# Patient Record
Sex: Female | Born: 1945 | Race: White | Hispanic: No | Marital: Married | Smoking: Former smoker
Health system: Southern US, Community
[De-identification: ages and names within clinical notes are randomized; demographics above are authoritative.]

## PROBLEM LIST (undated history)

## (undated) DIAGNOSIS — H269 Unspecified cataract: Secondary | ICD-10-CM

## (undated) DIAGNOSIS — D126 Benign neoplasm of colon, unspecified: Secondary | ICD-10-CM

## (undated) DIAGNOSIS — I4891 Unspecified atrial fibrillation: Secondary | ICD-10-CM

## (undated) DIAGNOSIS — I471 Supraventricular tachycardia, unspecified: Secondary | ICD-10-CM

## (undated) DIAGNOSIS — T7840XA Allergy, unspecified, initial encounter: Secondary | ICD-10-CM

## (undated) DIAGNOSIS — M1990 Unspecified osteoarthritis, unspecified site: Secondary | ICD-10-CM

## (undated) DIAGNOSIS — E785 Hyperlipidemia, unspecified: Secondary | ICD-10-CM

## (undated) DIAGNOSIS — G4730 Sleep apnea, unspecified: Secondary | ICD-10-CM

## (undated) DIAGNOSIS — K224 Dyskinesia of esophagus: Secondary | ICD-10-CM

## (undated) DIAGNOSIS — E079 Disorder of thyroid, unspecified: Secondary | ICD-10-CM

## (undated) DIAGNOSIS — K219 Gastro-esophageal reflux disease without esophagitis: Secondary | ICD-10-CM

## (undated) DIAGNOSIS — M8580 Other specified disorders of bone density and structure, unspecified site: Secondary | ICD-10-CM

## (undated) DIAGNOSIS — L439 Lichen planus, unspecified: Secondary | ICD-10-CM

## (undated) DIAGNOSIS — I10 Essential (primary) hypertension: Secondary | ICD-10-CM

## (undated) DIAGNOSIS — J45909 Unspecified asthma, uncomplicated: Secondary | ICD-10-CM

## (undated) DIAGNOSIS — Z86010 Personal history of colonic polyps: Secondary | ICD-10-CM

## (undated) DIAGNOSIS — B019 Varicella without complication: Secondary | ICD-10-CM

## (undated) HISTORY — DX: Personal history of colonic polyps: Z86.010

## (undated) HISTORY — DX: Unspecified atrial fibrillation: I48.91

## (undated) HISTORY — DX: Unspecified asthma, uncomplicated: J45.909

## (undated) HISTORY — DX: Hyperlipidemia, unspecified: E78.5

## (undated) HISTORY — DX: Lichen planus, unspecified: L43.9

## (undated) HISTORY — PX: POLYPECTOMY: SHX149

## (undated) HISTORY — DX: Supraventricular tachycardia, unspecified: I47.10

## (undated) HISTORY — DX: Disorder of thyroid, unspecified: E07.9

## (undated) HISTORY — DX: Gastro-esophageal reflux disease without esophagitis: K21.9

## (undated) HISTORY — DX: Unspecified cataract: H26.9

## (undated) HISTORY — PX: COLONOSCOPY: SHX174

## (undated) HISTORY — DX: Dyskinesia of esophagus: K22.4

## (undated) HISTORY — DX: Supraventricular tachycardia: I47.1

## (undated) HISTORY — PX: BREAST EXCISIONAL BIOPSY: SUR124

## (undated) HISTORY — DX: Essential (primary) hypertension: I10

## (undated) HISTORY — PX: CATARACT EXTRACTION: SUR2

## (undated) HISTORY — DX: Benign neoplasm of colon, unspecified: D12.6

## (undated) HISTORY — DX: Allergy, unspecified, initial encounter: T78.40XA

## (undated) HISTORY — PX: TONSILLECTOMY: SUR1361

## (undated) HISTORY — DX: Varicella without complication: B01.9

## (undated) HISTORY — PX: APPENDECTOMY: SHX54

## (undated) HISTORY — PX: BREAST CYST EXCISION: SHX579

## (undated) HISTORY — DX: Sleep apnea, unspecified: G47.30

## (undated) HISTORY — DX: Other specified disorders of bone density and structure, unspecified site: M85.80

## (undated) HISTORY — PX: THYROIDECTOMY, PARTIAL: SHX18

---

## 1967-10-21 HISTORY — PX: OOPHORECTOMY: SHX86

## 1970-10-20 HISTORY — PX: LAPAROSCOPIC ENDOMETRIOSIS FULGURATION: SUR769

## 1998-02-15 ENCOUNTER — Other Ambulatory Visit: Admission: RE | Admit: 1998-02-15 | Discharge: 1998-02-15 | Payer: Self-pay | Admitting: Obstetrics and Gynecology

## 1999-02-27 ENCOUNTER — Other Ambulatory Visit: Admission: RE | Admit: 1999-02-27 | Discharge: 1999-02-27 | Payer: Self-pay | Admitting: Obstetrics and Gynecology

## 2000-02-05 ENCOUNTER — Ambulatory Visit (HOSPITAL_COMMUNITY): Admission: RE | Admit: 2000-02-05 | Discharge: 2000-02-05 | Payer: Self-pay | Admitting: Gastroenterology

## 2000-02-17 ENCOUNTER — Encounter: Admission: RE | Admit: 2000-02-17 | Discharge: 2000-02-17 | Payer: Self-pay | Admitting: Obstetrics and Gynecology

## 2000-02-17 ENCOUNTER — Encounter: Payer: Self-pay | Admitting: Obstetrics and Gynecology

## 2000-02-28 ENCOUNTER — Other Ambulatory Visit: Admission: RE | Admit: 2000-02-28 | Discharge: 2000-02-28 | Payer: Self-pay | Admitting: Obstetrics and Gynecology

## 2000-03-25 ENCOUNTER — Other Ambulatory Visit: Admission: RE | Admit: 2000-03-25 | Discharge: 2000-03-25 | Payer: Self-pay | Admitting: Obstetrics and Gynecology

## 2001-03-25 ENCOUNTER — Encounter: Admission: RE | Admit: 2001-03-25 | Discharge: 2001-03-25 | Payer: Self-pay | Admitting: Obstetrics and Gynecology

## 2001-03-25 ENCOUNTER — Encounter: Payer: Self-pay | Admitting: Obstetrics and Gynecology

## 2001-04-28 ENCOUNTER — Other Ambulatory Visit: Admission: RE | Admit: 2001-04-28 | Discharge: 2001-04-28 | Payer: Self-pay | Admitting: Obstetrics and Gynecology

## 2002-04-27 ENCOUNTER — Encounter: Admission: RE | Admit: 2002-04-27 | Discharge: 2002-04-27 | Payer: Self-pay | Admitting: General Surgery

## 2002-04-27 ENCOUNTER — Encounter: Payer: Self-pay | Admitting: General Surgery

## 2002-04-28 ENCOUNTER — Other Ambulatory Visit: Admission: RE | Admit: 2002-04-28 | Discharge: 2002-04-28 | Payer: Self-pay | Admitting: Obstetrics and Gynecology

## 2002-08-19 ENCOUNTER — Encounter: Admission: RE | Admit: 2002-08-19 | Discharge: 2002-08-19 | Payer: Self-pay | Admitting: Obstetrics and Gynecology

## 2002-08-19 ENCOUNTER — Encounter: Payer: Self-pay | Admitting: Obstetrics and Gynecology

## 2002-09-01 ENCOUNTER — Encounter (INDEPENDENT_AMBULATORY_CARE_PROVIDER_SITE_OTHER): Payer: Self-pay | Admitting: Specialist

## 2002-09-01 ENCOUNTER — Ambulatory Visit (HOSPITAL_BASED_OUTPATIENT_CLINIC_OR_DEPARTMENT_OTHER): Admission: RE | Admit: 2002-09-01 | Discharge: 2002-09-01 | Payer: Self-pay | Admitting: General Surgery

## 2003-05-01 ENCOUNTER — Other Ambulatory Visit: Admission: RE | Admit: 2003-05-01 | Discharge: 2003-05-01 | Payer: Self-pay | Admitting: Obstetrics and Gynecology

## 2003-05-05 ENCOUNTER — Encounter: Admission: RE | Admit: 2003-05-05 | Discharge: 2003-05-05 | Payer: Self-pay | Admitting: General Surgery

## 2003-05-05 ENCOUNTER — Encounter: Payer: Self-pay | Admitting: General Surgery

## 2003-06-12 ENCOUNTER — Ambulatory Visit (HOSPITAL_COMMUNITY): Admission: RE | Admit: 2003-06-12 | Discharge: 2003-06-12 | Payer: Self-pay | Admitting: Gastroenterology

## 2003-06-12 ENCOUNTER — Encounter (INDEPENDENT_AMBULATORY_CARE_PROVIDER_SITE_OTHER): Payer: Self-pay | Admitting: Specialist

## 2003-06-12 DIAGNOSIS — Z860100 Personal history of colon polyps, unspecified: Secondary | ICD-10-CM

## 2003-06-12 HISTORY — DX: Personal history of colonic polyps: Z86.010

## 2003-06-12 HISTORY — DX: Personal history of colon polyps, unspecified: Z86.0100

## 2004-05-01 ENCOUNTER — Other Ambulatory Visit: Admission: RE | Admit: 2004-05-01 | Discharge: 2004-05-01 | Payer: Self-pay | Admitting: Obstetrics and Gynecology

## 2004-05-30 ENCOUNTER — Encounter: Admission: RE | Admit: 2004-05-30 | Discharge: 2004-05-30 | Payer: Self-pay | Admitting: Obstetrics and Gynecology

## 2004-09-11 ENCOUNTER — Encounter: Admission: RE | Admit: 2004-09-11 | Discharge: 2004-09-11 | Payer: Self-pay | Admitting: Obstetrics and Gynecology

## 2005-05-05 ENCOUNTER — Other Ambulatory Visit: Admission: RE | Admit: 2005-05-05 | Discharge: 2005-05-05 | Payer: Self-pay | Admitting: Obstetrics and Gynecology

## 2005-06-30 ENCOUNTER — Encounter: Admission: RE | Admit: 2005-06-30 | Discharge: 2005-06-30 | Payer: Self-pay | Admitting: Obstetrics and Gynecology

## 2006-05-06 ENCOUNTER — Other Ambulatory Visit: Admission: RE | Admit: 2006-05-06 | Discharge: 2006-05-06 | Payer: Self-pay | Admitting: Obstetrics and Gynecology

## 2006-07-09 ENCOUNTER — Encounter: Admission: RE | Admit: 2006-07-09 | Discharge: 2006-07-09 | Payer: Self-pay | Admitting: Obstetrics and Gynecology

## 2006-10-02 ENCOUNTER — Encounter: Admission: RE | Admit: 2006-10-02 | Discharge: 2006-10-02 | Payer: Self-pay | Admitting: Obstetrics and Gynecology

## 2007-05-10 ENCOUNTER — Other Ambulatory Visit: Admission: RE | Admit: 2007-05-10 | Discharge: 2007-05-10 | Payer: Self-pay | Admitting: Obstetrics and Gynecology

## 2007-07-14 ENCOUNTER — Encounter: Admission: RE | Admit: 2007-07-14 | Discharge: 2007-07-14 | Payer: Self-pay | Admitting: Obstetrics and Gynecology

## 2007-10-28 ENCOUNTER — Other Ambulatory Visit: Admission: RE | Admit: 2007-10-28 | Discharge: 2007-10-28 | Payer: Self-pay | Admitting: Obstetrics and Gynecology

## 2008-05-10 ENCOUNTER — Other Ambulatory Visit: Admission: RE | Admit: 2008-05-10 | Discharge: 2008-05-10 | Payer: Self-pay | Admitting: Obstetrics and Gynecology

## 2008-07-14 ENCOUNTER — Encounter: Admission: RE | Admit: 2008-07-14 | Discharge: 2008-07-14 | Payer: Self-pay | Admitting: Obstetrics and Gynecology

## 2008-10-20 HISTORY — PX: EYE SURGERY: SHX253

## 2008-12-27 ENCOUNTER — Encounter: Admission: RE | Admit: 2008-12-27 | Discharge: 2008-12-27 | Payer: Self-pay | Admitting: Obstetrics and Gynecology

## 2009-05-16 ENCOUNTER — Other Ambulatory Visit: Admission: RE | Admit: 2009-05-16 | Discharge: 2009-05-16 | Payer: Self-pay | Admitting: Obstetrics and Gynecology

## 2009-05-16 ENCOUNTER — Encounter: Payer: Self-pay | Admitting: Obstetrics and Gynecology

## 2009-05-16 ENCOUNTER — Ambulatory Visit: Payer: Self-pay | Admitting: Obstetrics and Gynecology

## 2009-07-18 ENCOUNTER — Encounter: Admission: RE | Admit: 2009-07-18 | Discharge: 2009-07-18 | Payer: Self-pay | Admitting: Obstetrics and Gynecology

## 2009-09-03 ENCOUNTER — Ambulatory Visit: Payer: Self-pay | Admitting: Obstetrics and Gynecology

## 2009-09-10 ENCOUNTER — Encounter: Admission: RE | Admit: 2009-09-10 | Discharge: 2009-09-10 | Payer: Self-pay | Admitting: Obstetrics and Gynecology

## 2009-11-30 ENCOUNTER — Ambulatory Visit: Payer: Self-pay | Admitting: Internal Medicine

## 2009-11-30 DIAGNOSIS — J45909 Unspecified asthma, uncomplicated: Secondary | ICD-10-CM | POA: Insufficient documentation

## 2009-12-09 DIAGNOSIS — E785 Hyperlipidemia, unspecified: Secondary | ICD-10-CM

## 2009-12-09 DIAGNOSIS — I10 Essential (primary) hypertension: Secondary | ICD-10-CM | POA: Insufficient documentation

## 2010-05-29 ENCOUNTER — Other Ambulatory Visit: Admission: RE | Admit: 2010-05-29 | Discharge: 2010-05-29 | Payer: Self-pay | Admitting: Obstetrics and Gynecology

## 2010-05-29 ENCOUNTER — Ambulatory Visit: Payer: Self-pay | Admitting: Obstetrics and Gynecology

## 2010-07-24 ENCOUNTER — Ambulatory Visit: Payer: Self-pay | Admitting: Cardiology

## 2010-08-22 ENCOUNTER — Encounter: Admission: RE | Admit: 2010-08-22 | Discharge: 2010-08-22 | Payer: Self-pay | Admitting: Obstetrics and Gynecology

## 2010-11-19 NOTE — Assessment & Plan Note (Signed)
Summary: WHEEZING/ MBW   Primary Provider/Referring Provider:  Gordy Clement  CC:  Pulmonary New Pt-asthma.  History of Present Illness: November 30, 2009- 65 yoF followed in the past at the old office and coming now to re-establish for asthma. Former smoker dx'd with asthma 20 years ago. She had not noted significant symptoms over the past 8 years. Previously as significant trigger was tobacco smoke exposure, but that had ended. Shortness of breath returned 2 months ago, with cold weather and exposure to a christmas tree. She did well while in [place] skiing this winter. Dr Deborah Chalk gave a Proair inhaler which she has rarely used, noting only occasional wheeze. Post nasal drip and dry cough come and go. No obvious respiratory infection. Comfortable lying down. Mild throat tickle. Reflux is controlled. No hx of pneumonia ot TB exposure. Treated for hypertension without heart disease. Has had flu vax, never pneumovax.  Preventive Screening-Counseling & Management  Alcohol-Tobacco     Smoking Status: quit  Current Medications (verified): 1)  Proair Hfa 108 (90 Base) Mcg/act Aers (Albuterol Sulfate) .... 2 Puffs Four Times A Day As Needed 2)  Lipitor 10 Mg Tabs (Atorvastatin Calcium) .... Take 1 By Mouth Once Daily 3)  Metoprolol Tartrate 50 Mg Tabs (Metoprolol Tartrate) .... Take 1 By Mouth Once Daily 4)  Ranitidine Hcl 75 Mg Tabs (Ranitidine Hcl) .... Take 1 By Mouth Once Daily  Allergies (verified): 1)  ! Macrodantin 2)  ! Lisinopril  Past History:  Past Medical History: Asthma Hyperlipidemia Hypertension  Past Surgical History: Breast cysts x 3  Family History: Heart Disease-father(HBP), Maternal Grandmother-MI Cancer-Father-Lung, Mother-colon   Social History: Patient states former smoker. Quit in 1984; smoked for 20 years 1/4-1/2 ppd. Not a regular smoker. 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  Review of Systems      See  HPI  Vital Signs:  Patient profile:   65 year old female Height:      62.5 inches Weight:      143.25 pounds BMI:     25.88 O2 Sat:      99 % on Room air Pulse rate:   66 / minute BP sitting:   118 / 78  (left arm) Cuff size:   regular  Vitals Entered By: Reynaldo Minium CMA (November 30, 2009 2:45 PM)  O2 Flow:  Room air  Physical Exam  Additional Exam:  General: A/Ox3; pleasant and cooperative, NAD, wdwn SKIN: no rash, lesions NODES: no lymphadenopathy HEENT: [place]/AT, EOM- WNL, Conjuctivae- clear, PERRLA, TM-WNL, Nose- clear, Throat- clear and wnl, [place]  III NECK: Supple w/ fair ROM, JVD- none, normal carotid impulses w/o bruits Thyroid- normal to palpation CHEST: Clear to P&A HEART: RRR, no m/g/r heard ABDOMEN: Soft and nl; VOZ:DGUY, nl pulses, no edema. cyanosis or clubbing NEURO: Grossly intact to observation      Impression & Recommendations:  Problem # 1:  ASTHMA, MILD (ICD-493.90) Mild intermittent exacerbation this winter. Doubt we can blame it all on her Christmas tree. She may have had an unrecognized viral infection as well. Cold air in [place] didn't bother her by itself. I don't suspect a cardiac issue or medication effect. We will give pneumovax and decide about PFT. A viral syndrome should resolve as the Spring season comes., so we agreed to watchand wait. She will come back as needed for now.  Medications Added to Medication List This Visit: 1)  Proair Hfa 108 (90 Base) Mcg/act Aers (Albuterol sulfate) .... 2 puffs four times  a day as needed 2)  Lipitor 10 Mg Tabs (Atorvastatin calcium) .... Take 1 by mouth once daily 3)  Metoprolol Tartrate 50 Mg Tabs (Metoprolol tartrate) .... Take 1 by mouth once daily 4)  Ranitidine Hcl 75 Mg Tabs (Ranitidine hcl) .... Take 1 by mouth once daily  Other Orders: Pneumococcal Vaccine (50093) Admin 1st Vaccine (81829)  Patient Instructions: 1)  Please schedule a follow-up appointment as needed. 2)  call for  changes or to refil med as needed 3)  Pneumovax   Immunizations Administered:  Pneumonia Vaccine:    Vaccine Type: Pneumovax    Site: left deltoid    Mfr: Merck    Dose: 0.5 ml    Route: IM    Given by: Vivianne Spence    Exp. Date: 02/07/2011    Lot #: [id number]    VIS given: 05/17/96 version given December 03, 2009.

## 2010-12-23 ENCOUNTER — Other Ambulatory Visit: Payer: Self-pay | Admitting: Obstetrics and Gynecology

## 2010-12-23 DIAGNOSIS — M8580 Other specified disorders of bone density and structure, unspecified site: Secondary | ICD-10-CM

## 2010-12-23 DIAGNOSIS — Z780 Asymptomatic menopausal state: Secondary | ICD-10-CM

## 2011-01-03 ENCOUNTER — Ambulatory Visit
Admission: RE | Admit: 2011-01-03 | Discharge: 2011-01-03 | Disposition: A | Discharge status: Home / Self Care | Payer: Medicare Other | Source: Ambulatory Visit | Attending: Obstetrics and Gynecology | Admitting: Obstetrics and Gynecology

## 2011-01-03 DIAGNOSIS — Z780 Asymptomatic menopausal state: Secondary | ICD-10-CM

## 2011-01-03 DIAGNOSIS — M8580 Other specified disorders of bone density and structure, unspecified site: Secondary | ICD-10-CM

## 2011-01-20 ENCOUNTER — Other Ambulatory Visit: Payer: Self-pay | Admitting: *Deleted

## 2011-01-20 DIAGNOSIS — E785 Hyperlipidemia, unspecified: Secondary | ICD-10-CM

## 2011-01-20 MED ORDER — ATORVASTATIN CALCIUM 10 MG PO TABS: 10.0000 mg | ORAL_TABLET | Freq: Every day | ORAL | Status: DC

## 2011-01-28 ENCOUNTER — Ambulatory Visit (INDEPENDENT_AMBULATORY_CARE_PROVIDER_SITE_OTHER): Payer: Medicare Other | Admitting: Cardiology

## 2011-01-28 ENCOUNTER — Encounter: Payer: Self-pay | Admitting: Cardiology

## 2011-01-28 DIAGNOSIS — I10 Essential (primary) hypertension: Secondary | ICD-10-CM

## 2011-01-28 DIAGNOSIS — E7800 Pure hypercholesterolemia, unspecified: Secondary | ICD-10-CM

## 2011-01-28 DIAGNOSIS — Z79899 Other long term (current) drug therapy: Secondary | ICD-10-CM

## 2011-01-28 DIAGNOSIS — E785 Hyperlipidemia, unspecified: Secondary | ICD-10-CM

## 2011-01-28 LAB — HEPATIC FUNCTION PANEL
AST: 16 U/L (ref 0–37)
Albumin: 3.8 g/dL (ref 3.5–5.2)
Alkaline Phosphatase: 43 U/L (ref 39–117)
Bilirubin, Direct: 0.1 mg/dL (ref 0.0–0.3)
Total Bilirubin: 0.9 mg/dL (ref 0.3–1.2)

## 2011-01-28 LAB — BASIC METABOLIC PANEL
GFR: 74.99 mL/min (ref >60.00)
Glucose, Bld: 95 mg/dL (ref 70–99)
Potassium: 4.3 mEq/L (ref 3.5–5.1)
Sodium: 142 mEq/L (ref 135–145)

## 2011-01-28 LAB — LIPID PANEL
LDL Cholesterol: 68 mg/dL (ref 0–99)
Total CHOL/HDL Ratio: 2
VLDL: 8.2 mg/dL (ref 0.0–40.0)

## 2011-01-28 NOTE — Assessment & Plan Note (Signed)
Lab work will be checked today. She'll continue low-dose Lipitor. We will have followup with primary care with Dr. Creola Corn.

## 2011-01-28 NOTE — Assessment & Plan Note (Signed)
We'll continue her on Toprol-XL 50 mg a day. Much more beta blocker may aggravate her asthma. She has been intolerant of lisinopril he

## 2011-01-28 NOTE — Progress Notes (Signed)
Subjective:   Meagan Key comes in today for followup visit. She has underlying hypertension and has done an excellent job taking care of her weight as well is exercising a regular basis. Blood pressure readings at home and then 120/80. She's had no chest pain or shortness of breath.  She has a history of hyperlipidemia. Lipids will be checked today. She's on Lipitor 10 mg a day.  She is history of idiopathic microscopic hematuria. She's had a thyroidectomy at age 19 with a history of goiter. She had previous appendectomy. She has a history of endometriosis. She's had mild obesity and past which is resolved. Overall, she feels well  Current Outpatient Prescriptions  Medication Sig Dispense Refill  . atorvastatin (LIPITOR) 10 MG tablet Take 1 tablet (10 mg total) by mouth daily.  30 tablet  6  . calcium carbonate (TUMS - DOSED IN MG ELEMENTAL CALCIUM) 500 MG chewable tablet Chew 1 tablet by mouth daily.        . Calcium-Vitamin D-Vitamin K (VIACTIV PO) Take by mouth daily.        . metoprolol (TOPROL-XL) 50 MG 24 hr tablet Take 50 mg by mouth daily.        . ranitidine (ZANTAC) 75 MG tablet Take 75 mg by mouth daily.          Allergies  Allergen Reactions  . Nitrofurantoin Shortness Of Breath  . Ace Inhibitors Swelling  . Lisinopril Swelling    Patient Active Problem List  Diagnoses  . HYPERLIPIDEMIA  . HYPERTENSION  . Unspecified asthma    History  Smoking status  . Never Smoker   Smokeless tobacco  . Never Used    History  Alcohol Use: Not on file    Family History  Problem Relation Age of Onset  . Colon cancer Mother   . Lung cancer Father     Review of Systems:   The patient denies any heat or cold intolerance.  No weight gain or weight loss.  The patient denies headaches or blurry vision.  There is no cough or sputum production.  The patient denies dizziness.  There is no hematuria or hematochezia.  The patient denies any muscle aches or arthritis.  The patient denies any  rash.  The patient denies frequent falling or instability.  There is no history of depression or anxiety.  All other systems were reviewed and are negative.   Physical Exam:   Weight is 136. Blood pressure is 140/90. Heart rate 72.The head is normocephalic and atraumatic.  Pupils are equally round and reactive to light.  Sclerae nonicteric.  Conjunctiva is clear.  Oropharynx is unremarkable.  There's adequate oral airway.  Neck is supple there are no masses.  Thyroid scar is present.  There is no lymphadenopathy.  Lungs are clear.  Chest is symmetric.  Heart shows a regular rate and rhythm.  S1 and S2 are normal.  There is no murmur click or gallop.  Abdomen is soft normal bowel sounds.  There is no organomegaly.  Genital and rectal deferred.  Extremities are without edema.  Peripheral pulses are adequate.  Neurologically intact.  Full range of motion.  The patient is not depressed.  Skin is warm and dry.  Assessment / Plan:

## 2011-01-30 ENCOUNTER — Telehealth: Payer: Self-pay | Admitting: Cardiology

## 2011-01-30 ENCOUNTER — Telehealth: Payer: Self-pay | Admitting: *Deleted

## 2011-01-30 NOTE — Telephone Encounter (Signed)
Message copied by Barnetta Hammersmith on Thu Jan 30, 2011  8:52 AM ------      Message from: Roger Shelter      Created: Wed Jan 29, 2011  8:48 AM       ok

## 2011-01-30 NOTE — Telephone Encounter (Signed)
PLEASE CALL Friday.  THEY ARE ASKING WHO IS PATIENTS OLD PRIMARY CARE DOC?

## 2011-01-30 NOTE — Telephone Encounter (Signed)
Pt notified of lab results and to continue same medications.  Pt will f/u with Dr. Timothy Lasso for lab work in six months.

## 2011-01-31 NOTE — Telephone Encounter (Signed)
We are PCP. Joann notified at [facility].

## 2011-02-28 ENCOUNTER — Other Ambulatory Visit: Payer: Self-pay | Admitting: Cardiology

## 2011-02-28 NOTE — Telephone Encounter (Signed)
Pt requesting to switch to generic Lipitor.  [facility] notified ok to start Generic Lipitor.  Pt notified.

## 2011-02-28 NOTE — Telephone Encounter (Signed)
Pt wants to get Rx for Generic Lipitor Please call

## 2011-03-07 NOTE — Op Note (Signed)
   NAMESEVERA, JEREMIAH                         ACCOUNT NO.:  [id number]   MEDICAL RECORD NO.:  [id number]                   PATIENT TYPE:  AMB   LOCATION:  ENDO                                 FACILITY:  [facility]   PHYSICIAN:  John C. Madilyn Fireman, M.D.                 DATE OF BIRTH:  [birth year]   DATE OF PROCEDURE:  06/12/2003  DATE OF DISCHARGE:                                 OPERATIVE REPORT   PROCEDURE:  Colonoscopy.   INDICATIONS FOR PROCEDURE:  Family history of colon cancer in two first  degree relatives.   DESCRIPTION OF PROCEDURE:  The patient was placed in the left lateral  decubitus position then placed on the pulse monitor with continuous low flow  oxygen delivered by nasal cannula. She was sedated with 62.5 mcg IV fentanyl  and 6 mg IV Versed. The Olympus video colonoscope was inserted into the  rectum and advanced to the cecum, confirmed by transillumination at  McBurney's point and visualization of the ileocecal valve and appendiceal  orifice. The prep was excellent. The cecum, ascending, transverse,  descending and sigmoid colon all appeared normal with no masses, polyps,  diverticula or other mucosal abnormalities. Within the rectum, there was  seen an 8 mm polyp at approximately 15 cm and this was fulgurated by hot  biopsy. The remainder of the rectum appeared normal and retroflexed view of  the anus revealed no obvious internal hemorrhoids. The scope was then  withdrawn and the patient returned to the recovery room in stable condition.  She tolerated the procedure well and there were no immediate complications.   IMPRESSION:  Rectal polyp.   PLAN:  Await histology to determine interval for next colonoscopy.                                               John C. Madilyn Fireman, M.D.    JCH/MEDQ  D:  06/12/2003  T:  06/12/2003  Job:  [id number]

## 2011-03-07 NOTE — Op Note (Signed)
   NAMEDEIRDRA, HEUMANN                         ACCOUNT NO.:  [id number]   MEDICAL RECORD NO.:  [id number]                   PATIENT TYPE:  AMB   LOCATION:  DSC                                  FACILITY:  [facility]   PHYSICIAN:  Rose Phi. Maple Hudson, M.D.                DATE OF BIRTH:  [birth year]   DATE OF PROCEDURE:  09/01/2002  DATE OF DISCHARGE:                                 OPERATIVE REPORT   PREOPERATIVE DIAGNOSIS:  Left breast mass.   POSTOPERATIVE DIAGNOSIS:  Left breast mass, pathology pending.   OPERATION PERFORMED:  Excision of left breast mass.   SURGEON:  Rose Phi. Maple Hudson, M.D.   ANESTHESIA:  MAC.   INDICATIONS FOR PROCEDURE:  This patient has had multiple cysts in this same  area aspirated in the past and was left with a residual hard fibrotic mass  which we are going to excise.   DESCRIPTION OF PROCEDURE:  The patient was placed on the operating table  with the left arm extended on the arm board.  The left breast was prepped  and draped in the usual fashion.  A curvilinear incision was outlined over  the palpable mass at about the 1 o'clock position of the left breast.  The  area was then thoroughly infiltrated with 1% Xylocaine with Adrenalin.  Incision was made and with sharp dissection, this obviously fibrocystic mass  was excised.  The mass was submitted to the pathologist.  Hemostasis was  obtained with the cautery.  Subcuticular closure with 4-0 Monocryl and Steri-  Strips carried out.  Dressing applied.  The patient was then transferred to  the recovery room in satisfactory condition having tolerated the procedure  well.                                                 Rose Phi. Maple Hudson, M.D.    PRY/MEDQ  D:  09/01/2002  T:  09/01/2002  Job:  [id number]

## 2011-03-10 ENCOUNTER — Other Ambulatory Visit: Payer: Self-pay | Admitting: Cardiology

## 2011-03-10 MED ORDER — METOPROLOL SUCCINATE ER 50 MG PO TB24: 50.0000 mg | ORAL_TABLET | Freq: Every day | ORAL | Status: DC

## 2011-03-10 NOTE — Telephone Encounter (Signed)
Med refill

## 2011-07-02 DIAGNOSIS — M899 Disorder of bone, unspecified: Secondary | ICD-10-CM | POA: Insufficient documentation

## 2011-07-02 DIAGNOSIS — Z800 Family history of malignant neoplasm of digestive organs: Secondary | ICD-10-CM | POA: Insufficient documentation

## 2011-07-02 DIAGNOSIS — I10 Essential (primary) hypertension: Secondary | ICD-10-CM | POA: Insufficient documentation

## 2011-07-02 DIAGNOSIS — R3121 Asymptomatic microscopic hematuria: Secondary | ICD-10-CM | POA: Insufficient documentation

## 2011-07-02 DIAGNOSIS — E049 Nontoxic goiter, unspecified: Secondary | ICD-10-CM | POA: Insufficient documentation

## 2011-07-02 DIAGNOSIS — M8580 Other specified disorders of bone density and structure, unspecified site: Secondary | ICD-10-CM | POA: Insufficient documentation

## 2011-07-02 DIAGNOSIS — E785 Hyperlipidemia, unspecified: Secondary | ICD-10-CM | POA: Insufficient documentation

## 2011-07-02 DIAGNOSIS — K219 Gastro-esophageal reflux disease without esophagitis: Secondary | ICD-10-CM | POA: Insufficient documentation

## 2011-07-02 DIAGNOSIS — H409 Unspecified glaucoma: Secondary | ICD-10-CM | POA: Insufficient documentation

## 2011-07-16 ENCOUNTER — Encounter: Payer: Medicare Other | Admitting: Obstetrics and Gynecology

## 2011-08-21 ENCOUNTER — Other Ambulatory Visit: Payer: Self-pay | Admitting: Obstetrics and Gynecology

## 2011-08-21 DIAGNOSIS — Z1231 Encounter for screening mammogram for malignant neoplasm of breast: Secondary | ICD-10-CM

## 2011-08-25 ENCOUNTER — Encounter: Payer: Self-pay | Admitting: Obstetrics and Gynecology

## 2011-08-25 ENCOUNTER — Ambulatory Visit (INDEPENDENT_AMBULATORY_CARE_PROVIDER_SITE_OTHER): Payer: Medicare Other | Admitting: Obstetrics and Gynecology

## 2011-08-25 VITALS — BP 122/74 | Ht 62.0 in | Wt 142.0 lb

## 2011-08-25 DIAGNOSIS — M949 Disorder of cartilage, unspecified: Secondary | ICD-10-CM

## 2011-08-25 DIAGNOSIS — N952 Postmenopausal atrophic vaginitis: Secondary | ICD-10-CM

## 2011-08-25 DIAGNOSIS — M8580 Other specified disorders of bone density and structure, unspecified site: Secondary | ICD-10-CM

## 2011-08-25 DIAGNOSIS — N6019 Diffuse cystic mastopathy of unspecified breast: Secondary | ICD-10-CM

## 2011-08-25 NOTE — Progress Notes (Signed)
Subjective:     Patient ID: Meagan Key, female   DOB: 12/19/1945, 65 y.o.   MRN: [id number]  HPIpatient came to see me today for further followup. She has fibrocystic disease of her breasts and has required multiple call backs for further mammograms and previous biopsies. She has never had a malignancy. She is not having menopausal symptoms. She is however having vaginal dryness. She uses Vagifem but not regularly. One of the reasons is the cost. She is not having vaginal bleeding. She is not having pelvic pain. She is osteopenia and is on holiday from [place]. She just had a bone density that showed stability. She takes calcium and vitamin D. She exercises regularly. She has had no fractures   Review of Systems  Constitutional: Negative.   HENT: Negative.   Eyes: Negative.   Respiratory: Negative.        Asthma  Cardiovascular:       Hypertension. Hyperlipidemia.  Gastrointestinal: Negative.   Genitourinary: Negative.   Musculoskeletal: Negative.   Skin: Negative.   Neurological: Negative.   Hematological: Negative.   Psychiatric/Behavioral: Negative.        Objective:   Physical ExamPhysical examination:Kim Gardner present.HEENT within normal limits. Neck: Thyroid not large. No masses. Supraclavicular nodes: not enlarged. Breasts: Examined in both sitting midline position. No skin changes and no masses. Abdomen: Soft no guarding rebound or masses or hernia. Pelvic: External: Within normal limits. BUS: Within normal limits. Vaginal:within normal limits. Good estrogen effect. No evidence of cystocele rectocele or enterocele. Cervix: clean. Uterus: Normal size and shape. Adnexa: No masses. Rectovaginal exam: Confirmatory and negative. Extremities: Within normal limits.     Assessment:     #1. Atrophic vaginitis #2. Fibrocystic breast disease #3. Osteopenia    Plan:     Mammogram this month. Continue periodic bone densities. Switched her to estradiol vaginal cream 0.02% 3  times a week in the vagina.

## 2011-08-26 ENCOUNTER — Other Ambulatory Visit: Payer: Self-pay | Admitting: Nurse Practitioner

## 2011-08-26 DIAGNOSIS — E785 Hyperlipidemia, unspecified: Secondary | ICD-10-CM

## 2011-08-26 MED ORDER — ATORVASTATIN CALCIUM 10 MG PO TABS: 10.0000 mg | ORAL_TABLET | Freq: Every day | ORAL | Status: DC

## 2011-08-27 ENCOUNTER — Encounter: Payer: Medicare Other | Admitting: Obstetrics and Gynecology

## 2011-09-19 ENCOUNTER — Ambulatory Visit: Payer: Medicare Other

## 2011-09-20 LAB — HM MAMMOGRAPHY

## 2011-09-26 ENCOUNTER — Ambulatory Visit
Admission: RE | Admit: 2011-09-26 | Discharge: 2011-09-26 | Disposition: A | Discharge status: Home / Self Care | Payer: Medicare Other | Source: Ambulatory Visit | Attending: Obstetrics and Gynecology | Admitting: Obstetrics and Gynecology

## 2011-09-26 DIAGNOSIS — Z1231 Encounter for screening mammogram for malignant neoplasm of breast: Secondary | ICD-10-CM

## 2011-10-21 HISTORY — PX: UPPER GASTROINTESTINAL ENDOSCOPY: SHX188

## 2011-12-27 ENCOUNTER — Emergency Department (HOSPITAL_COMMUNITY)
Admission: EM | Admit: 2011-12-27 | Discharge: 2011-12-27 | Disposition: A | Discharge status: Home Health | Payer: Medicare Other | Attending: Emergency Medicine | Admitting: Emergency Medicine

## 2011-12-27 ENCOUNTER — Encounter (HOSPITAL_COMMUNITY): Payer: Self-pay | Admitting: Nurse Practitioner

## 2011-12-27 DIAGNOSIS — Z87891 Personal history of nicotine dependence: Secondary | ICD-10-CM | POA: Insufficient documentation

## 2011-12-27 DIAGNOSIS — M810 Age-related osteoporosis without current pathological fracture: Secondary | ICD-10-CM | POA: Insufficient documentation

## 2011-12-27 DIAGNOSIS — E785 Hyperlipidemia, unspecified: Secondary | ICD-10-CM | POA: Diagnosis not present

## 2011-12-27 DIAGNOSIS — X58XXXA Exposure to other specified factors, initial encounter: Secondary | ICD-10-CM | POA: Insufficient documentation

## 2011-12-27 DIAGNOSIS — E669 Obesity, unspecified: Secondary | ICD-10-CM | POA: Diagnosis not present

## 2011-12-27 DIAGNOSIS — K219 Gastro-esophageal reflux disease without esophagitis: Secondary | ICD-10-CM | POA: Insufficient documentation

## 2011-12-27 DIAGNOSIS — T18108A Unspecified foreign body in esophagus causing other injury, initial encounter: Secondary | ICD-10-CM

## 2011-12-27 DIAGNOSIS — I10 Essential (primary) hypertension: Secondary | ICD-10-CM | POA: Insufficient documentation

## 2011-12-27 MED ORDER — BENZONATATE 100 MG PO CAPS: 100.0000 mg | ORAL_CAPSULE | Freq: Three times a day (TID) | ORAL | Status: AC

## 2011-12-27 MED ORDER — ALBUTEROL SULFATE HFA 108 (90 BASE) MCG/ACT IN AERS: 1.0000 | INHALATION_SPRAY | Freq: Four times a day (QID) | RESPIRATORY_TRACT | Status: DC | PRN

## 2011-12-27 NOTE — ED Notes (Signed)
Pt was eating pizza and got chocked on it and now feels lodged in throat. Breathing easily now

## 2011-12-27 NOTE — ED Notes (Signed)
Pt denies dyspnea, CP and discomfort in the throat.

## 2011-12-27 NOTE — ED Provider Notes (Signed)
History     CSN: [id number]  Arrival date & time 12/27/11  1217   First MD Initiated Contact with Patient 12/27/11 1341      Chief Complaint  Patient presents with  . Choking    (Consider location/radiation/quality/duration/timing/severity/associated sxs/prior treatment) HPI PT eating pizza and felt food lodged in throat. By the time seen in ED FB sensation has passed and pt states she is at her baseline. She is tolerating PO's well and wishes to go home.  Past Medical History  Diagnosis Date  . Hyperlipidemia   . Mild obesity   . Endometriosis   . Osteopenia   . Atrophic vaginitis   . Hypertension   . Glaucoma     NARROW ANGLE  . Hematuria   . Reflux     Past Surgical History  Procedure Date  . Thyroidectomy, partial AGE 66  . Laparoscopic endometriosis fulguration 1972  . Breast cyst excision AGE 66, 23, 56    FOR BREAST CYST  . Pelvic laparoscopy   . Oophorectomy 1698    RSO  . Eye surgery 2010  . Appendectomy   . Tonsillectomy     Family History  Problem Relation Age of Onset  . Colon cancer Mother   . Cancer Mother     COLON  . Lung cancer Father   . Cancer Father     COLON, LUNG  . Hyperlipidemia Father   . Breast cancer Maternal Aunt   . Hyperlipidemia Paternal Grandmother   . Heart attack Paternal Grandmother     History  Substance Use Topics  . Smoking status: Former Games developer  . Smokeless tobacco: Never Used  . Alcohol Use: 3.5 oz/week    7 drink(s) per week    OB History    Grav Para Term Preterm Abortions TAB SAB Ect Mult Living   2 2 2       2       Review of Systems  HENT: Positive for trouble swallowing.   Respiratory: Positive for choking and shortness of breath.   Cardiovascular: Negative for chest pain.    Allergies  Nitrofurantoin and Lisinopril  Home Medications   Current Outpatient Rx  Name Route Sig Dispense Refill  . ATORVASTATIN CALCIUM 10 MG PO TABS Oral Take 10 mg by mouth daily.    Marland Kitchen DM-GUAIFENESIN ER 30-600  MG PO TB12 Oral Take 1 tablet by mouth every 12 (twelve) hours as needed. For cough    . ROBITUSSIN DM PO Oral Take 10 mLs by mouth 2 (two) times daily as needed. For cough    . DIPHENHYDRAMINE HCL 25 MG PO TABS Oral Take 25 mg by mouth 2 (two) times daily as needed. For allergy like symptoms.    Marland Kitchen METOPROLOL SUCCINATE ER 50 MG PO TB24 Oral Take 50 mg by mouth daily.    Marland Kitchen RANITIDINE HCL 75 MG PO TABS Oral Take 75 mg by mouth daily.     . ALBUTEROL SULFATE HFA 108 (90 BASE) MCG/ACT IN AERS Inhalation Inhale 1-2 puffs into the lungs every 6 (six) hours as needed for wheezing. 1 Inhaler 0  . BENZONATATE 100 MG PO CAPS Oral Take 1 capsule (100 mg total) by mouth every 8 (eight) hours. 21 capsule 0    BP 129/80  Pulse 81  Temp(Src) 98.6 F (37 C) (Oral)  Resp 20  Ht 5\' 2"  (1.575 m)  Wt 140 lb (63.504 kg)  BMI 25.61 kg/m2  SpO2 97%  Physical Exam  Nursing note and  vitals reviewed. Constitutional: She is oriented to person, place, and time. She appears well-developed and well-nourished. No distress.  HENT:  Head: Normocephalic and atraumatic.  Mouth/Throat: Oropharynx is clear and moist.  Eyes: EOM are normal. Pupils are equal, round, and reactive to light.  Neck: Normal range of motion. Neck supple.  Cardiovascular: Normal rate and regular rhythm.   Pulmonary/Chest: Effort normal and breath sounds normal. No stridor. No respiratory distress. She has no wheezes. She has no rales.  Abdominal: Soft. Bowel sounds are normal. There is no tenderness. There is no rebound and no guarding.  Musculoskeletal: Normal range of motion. She exhibits no edema and no tenderness.  Neurological: She is alert and oriented to person, place, and time.  Skin: Skin is warm and dry. No rash noted. No erythema.  Psychiatric: She has a normal mood and affect. Her behavior is normal.    ED Course  Procedures (including critical care time)  Labs Reviewed - No data to display No results found.   1. Esophageal  foreign body       MDM           Loren Racer, MD 12/27/11 1406

## 2012-01-28 DIAGNOSIS — E049 Nontoxic goiter, unspecified: Secondary | ICD-10-CM | POA: Diagnosis not present

## 2012-01-28 DIAGNOSIS — E785 Hyperlipidemia, unspecified: Secondary | ICD-10-CM | POA: Diagnosis not present

## 2012-01-28 DIAGNOSIS — I10 Essential (primary) hypertension: Secondary | ICD-10-CM | POA: Diagnosis not present

## 2012-01-28 DIAGNOSIS — M899 Disorder of bone, unspecified: Secondary | ICD-10-CM | POA: Diagnosis not present

## 2012-01-28 DIAGNOSIS — R82998 Other abnormal findings in urine: Secondary | ICD-10-CM | POA: Diagnosis not present

## 2012-02-04 DIAGNOSIS — E049 Nontoxic goiter, unspecified: Secondary | ICD-10-CM | POA: Diagnosis not present

## 2012-02-04 DIAGNOSIS — I10 Essential (primary) hypertension: Secondary | ICD-10-CM | POA: Diagnosis not present

## 2012-02-04 DIAGNOSIS — J45909 Unspecified asthma, uncomplicated: Secondary | ICD-10-CM | POA: Diagnosis not present

## 2012-02-04 DIAGNOSIS — Z0000 Encounter for general adult medical examination without abnormal findings: Secondary | ICD-10-CM | POA: Diagnosis not present

## 2012-02-04 DIAGNOSIS — E559 Vitamin D deficiency, unspecified: Secondary | ICD-10-CM | POA: Insufficient documentation

## 2012-06-18 ENCOUNTER — Telehealth: Payer: Self-pay | Admitting: Gastroenterology

## 2012-06-18 NOTE — Telephone Encounter (Signed)
Dr Patterson, please advise. Thanks. 

## 2012-06-22 NOTE — Telephone Encounter (Signed)
appt asap per schedule

## 2012-06-22 NOTE — Telephone Encounter (Signed)
lmom for pt to call back; had an opening today but unable to reach her.

## 2012-07-07 NOTE — Telephone Encounter (Signed)
Pt never called back.

## 2012-08-16 DIAGNOSIS — Z23 Encounter for immunization: Secondary | ICD-10-CM | POA: Diagnosis not present

## 2012-08-24 ENCOUNTER — Telehealth: Payer: Self-pay | Admitting: Gastroenterology

## 2012-08-24 NOTE — Telephone Encounter (Signed)
Informed pt Dr Jarold Motto will see her on 08/26/12; pt stated understanding.

## 2012-08-25 ENCOUNTER — Encounter: Payer: Medicare Other | Admitting: Obstetrics and Gynecology

## 2012-08-25 ENCOUNTER — Encounter: Payer: Self-pay | Admitting: *Deleted

## 2012-08-26 ENCOUNTER — Encounter: Payer: Self-pay | Admitting: Gastroenterology

## 2012-08-26 ENCOUNTER — Ambulatory Visit (INDEPENDENT_AMBULATORY_CARE_PROVIDER_SITE_OTHER): Payer: Medicare Other | Admitting: Gastroenterology

## 2012-08-26 VITALS — BP 132/72 | HR 60 | Ht 62.0 in | Wt 146.0 lb

## 2012-08-26 DIAGNOSIS — Z9009 Acquired absence of other part of head and neck: Secondary | ICD-10-CM

## 2012-08-26 DIAGNOSIS — Z800 Family history of malignant neoplasm of digestive organs: Secondary | ICD-10-CM

## 2012-08-26 DIAGNOSIS — R1310 Dysphagia, unspecified: Secondary | ICD-10-CM | POA: Diagnosis not present

## 2012-08-26 DIAGNOSIS — K219 Gastro-esophageal reflux disease without esophagitis: Secondary | ICD-10-CM | POA: Diagnosis not present

## 2012-08-26 DIAGNOSIS — Z9889 Other specified postprocedural states: Secondary | ICD-10-CM | POA: Diagnosis not present

## 2012-08-26 MED ORDER — OMEPRAZOLE-SODIUM BICARBONATE 40-1100 MG PO CAPS: 1.0000 | ORAL_CAPSULE | Freq: Every day | ORAL | Status: DC

## 2012-08-26 NOTE — Patient Instructions (Addendum)
You have been scheduled for a Barium Esophogram at [facility] (1st floor of the hospital) on 09-01-2012 at 11AM. Please arrive 15 minutes prior to your appointment for registration. Make certain not to have anything to eat or drink 6 hours prior to your test. If you need to reschedule for any reason, please contact radiology at [phone] to do so. __________________________________________________________________ A barium swallow is an examination that concentrates on views of the esophagus. This tends to be a double contrast exam (barium and two liquids which, when combined, create a gas to distend the wall of the oesophagus) or single contrast (non-ionic iodine based). The study is usually tailored to your symptoms so a good history is essential. Attention is paid during the study to the form, structure and configuration of the esophagus, looking for functional disorders (such as aspiration, dysphagia, achalasia, motility and reflux) EXAMINATION You may be asked to change into a gown, depending on the type of swallow being performed. A radiologist and radiographer will perform the procedure. The radiologist will advise you of the type of contrast selected for your procedure and direct you during the exam. You will be asked to stand, sit or lie in several different positions and to hold a small amount of fluid in your mouth before being asked to swallow while the imaging is performed .In some instances you may be asked to swallow barium coated marshmallows to assess the motility of a solid food bolus. The exam can be recorded as a digital or video fluoroscopy procedure. POST PROCEDURE It will take 1-2 days for the barium to pass through your system. To facilitate this, it is important, unless otherwise directed, to increase your fluids for the next 24-48hrs and to resume your normal diet.  This test typically takes about 30 minutes to  perform.  __________________________________________________________________________________________________  Please stop Zantac and start taking Zegerid one capsule by mouth once daily   We have given you samples of the following medication to take: Zegerid   __________________________________________________________________________________  Diet for Gastroesophageal Reflux Disease, Adult Reflux (acid reflux) is when acid from your stomach flows up into the esophagus. When acid comes in contact with the esophagus, the acid causes irritation and soreness (inflammation) in the esophagus. When reflux happens often or so severely that it causes damage to the esophagus, it is called gastroesophageal reflux disease (GERD). Nutrition therapy can help ease the discomfort of GERD. FOODS OR DRINKS TO AVOID OR LIMIT  Smoking or chewing tobacco. Nicotine is one of the most potent stimulants to acid production in the gastrointestinal tract.  Caffeinated and decaffeinated coffee and black tea.  Regular or low-calorie carbonated beverages or energy drinks (caffeine-free carbonated beverages are allowed).   Strong spices, such as black pepper, white pepper, red pepper, cayenne, curry powder, and chili powder.  Peppermint or spearmint.  Chocolate.  High-fat foods, including meats and fried foods. Extra added fats including oils, butter, salad dressings, and nuts. Limit these to less than 8 tsp per day.  Fruits and vegetables if they are not tolerated, such as citrus fruits or tomatoes.  Alcohol.  Any food that seems to aggravate your condition. If you have questions regarding your diet, call your caregiver or a registered dietitian. OTHER THINGS THAT MAY HELP GERD INCLUDE:   Eating your meals slowly, in a relaxed setting.  Eating 5 to 6 small meals per day instead of 3 large meals.  Eliminating food for a period of time if it causes distress.  Not lying down  until 3 hours after eating a  meal.  Keeping the head of your bed raised 6 to 9 inches (15 to 23 cm) by using a foam wedge or blocks under the legs of the bed. Lying flat may make symptoms worse.  Being physically active. Weight loss may be helpful in reducing reflux in overweight or obese adults.  Wear loose fitting clothing EXAMPLE MEAL PLAN This meal plan is approximately 2,000 calories based on https://www.bernard.org/ meal planning guidelines. Breakfast   cup cooked oatmeal.  1 cup strawberries.  1 cup low-fat milk.  1 oz almonds. Snack  1 cup cucumber slices.  6 oz yogurt (made from low-fat or fat-free milk). Lunch  2 slice whole-wheat bread.  2 oz sliced [place].  2 tsp mayonnaise.  1 cup blueberries.  1 cup snap peas. Snack  6 whole-wheat crackers.  1 oz string cheese. Dinner   cup brown rice.  1 cup mixed veggies.  1 tsp olive oil.  3 oz grilled fish. Document Released: 10/06/2005 Document Revised: 12/29/2011 Document Reviewed: 08/22/2011 [facility] Patient Information 2013 [place], [place].

## 2012-08-26 NOTE — Progress Notes (Signed)
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 She has a history of colon cancer in her mother, and has had several colonoscopies by Dr. Dorena Cookey, most recently one year ago. She currently describes intermittent solid food dysphagia usually with garlic or" scratchy foods". She's had chronic acid reflux for many years but could not tolerate Prilosec because of nausea, therefore takes Zantac 75 to 150 mg at bedtime fairly regularly. She recently was seen in the emergency room after she had a meat impaction which required Heimlich maneuver by her husband. The patient seems to minimalize a lot of her complaints and difficulties. She's had no anorexia, weight loss, and denies lower GI or hepatobiliary symptoms. There is no history of Raynaud's phenomenon or collagen vascular disease. We have been unable to obtain records from her previous gastroenterologist.  The patient is status post appendectomy, has a history of treated hypertension and previous thyroidectomy for thyroid goiter.  I have reviewed this patient's present history, medical and surgical past history, allergies and medications.     ROS: The remainder of the 10 point ROS is negative     Physical Exam: Blood pressure 132/72, pulse 60 and regular, and weight 146 pounds BMI of 26.70. I cannot appreciate stigmata of chronic liver disease. She has a transverse scar over her mid neck area, but otherwise no cervical lymphadenopathy or thyromegaly. Examination oral pharyngeal area is unremarkable.  General well developed well nourished patient in no acute distress, appearing their stated age Eyes PERRLA, no icterus, fundoscopic exam per opthamologist Skin no lesions noted Neck supple, no adenopathy, no thyroid enlargement, no tenderness Chest clear to percussion and auscultation Heart no significant murmurs, gallops or rubs noted Abdomen no hepatosplenomegaly  masses or tenderness, BS normal.  Extremities no acute joint lesions, edema, phlebitis or evidence of cellulitis. Neurologic patient oriented x 3, cranial nerves intact, no focal neurologic deficits noted. Psychological mental status normal and normal affect.  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 I have ordered barium swallow and we'll proceed accordingly. I suspect she will need endoscopy and dilation. I've discussed reflux maneuvers with this patient, and have placed her on Zegerid 40 mg at bedtime in place of H2 blockers. I did not do any laboratory evaluation at this time. She wishes to continue her colonoscopy followup with Dr. Madilyn Fireman.  Encounter Diagnosis  Name Primary?  Marland Kitchen Dysphagia Yes

## 2012-08-31 ENCOUNTER — Ambulatory Visit (INDEPENDENT_AMBULATORY_CARE_PROVIDER_SITE_OTHER): Payer: Medicare Other | Admitting: Obstetrics and Gynecology

## 2012-08-31 ENCOUNTER — Encounter: Payer: Self-pay | Admitting: Obstetrics and Gynecology

## 2012-08-31 ENCOUNTER — Other Ambulatory Visit (HOSPITAL_COMMUNITY)
Admission: RE | Admit: 2012-08-31 | Discharge: 2012-08-31 | Disposition: A | Discharge status: Home / Self Care | Payer: Medicare Other | Source: Ambulatory Visit | Attending: Obstetrics and Gynecology | Admitting: Obstetrics and Gynecology

## 2012-08-31 VITALS — BP 136/84 | Ht 62.0 in | Wt 146.0 lb

## 2012-08-31 DIAGNOSIS — N952 Postmenopausal atrophic vaginitis: Secondary | ICD-10-CM

## 2012-08-31 DIAGNOSIS — Z124 Encounter for screening for malignant neoplasm of cervix: Secondary | ICD-10-CM | POA: Insufficient documentation

## 2012-08-31 DIAGNOSIS — M8580 Other specified disorders of bone density and structure, unspecified site: Secondary | ICD-10-CM

## 2012-08-31 DIAGNOSIS — M899 Disorder of bone, unspecified: Secondary | ICD-10-CM

## 2012-08-31 DIAGNOSIS — M949 Disorder of cartilage, unspecified: Secondary | ICD-10-CM | POA: Diagnosis not present

## 2012-08-31 DIAGNOSIS — Z780 Asymptomatic menopausal state: Secondary | ICD-10-CM

## 2012-08-31 DIAGNOSIS — N809 Endometriosis, unspecified: Secondary | ICD-10-CM

## 2012-08-31 DIAGNOSIS — N6019 Diffuse cystic mastopathy of unspecified breast: Secondary | ICD-10-CM

## 2012-08-31 NOTE — Patient Instructions (Signed)
Schedule mammogram.

## 2012-08-31 NOTE — Progress Notes (Signed)
Patient came back to see me today for further followup. She has atrophic vaginitis with dyspareunia. We have use both Vagifem and Estrace vaginal cream with good results. We switched her last year to  estradiol vaginal cream from [facility] for cost-effectiveness. It works just as well. She does have fibrocystic breast disease but has not felt Anything in her breast recently. She has had frequent recalls and breast biopsies. She is due for her yearly mammogram. She has minimal menopausal symptoms. She has osteopenia and was treated with Fosamax. She has been on drug holiday for 4 years with stability of bones on bone density. She has had no fractures. She's had no vaginal bleeding. She's had no pelvic pain. She has always had normal Pap smears. Her last Pap smear was 2011. 1969 she had a right salpingo-oophorectomy for endometriosis.  ROS: 12 system review done. Pertinent positives above. Other positives include hyperlipidemia, closed angle glaucoma, Asthma, hypertension.  Physical examination:Meagan Key present.HEENT within normal limits. Neck: Thyroid not large. No masses. Supraclavicular nodes: not enlarged. Breasts: Examined in both sitting and lying  position. No skin changes and no masses. Abdomen: Soft no guarding rebound or masses or hernia. Pelvic: External: Within normal limits. BUS: Within normal limits. Vaginal:within normal limits. Good estrogen effect. No evidence of cystocele rectocele or enterocele. Cervix: clean. Uterus: Normal size and shape. Adnexa: No masses. Rectovaginal exam: Confirmatory and negative. Extremities: Within normal limits.  Assessment: #1. Fibrocystic breast disease #2. Atrophic vaginitis #3. Mild menopausal symptoms #4. Osteopenia #5. Endometriosis  Plan: Mammogram. Periodic bone densities-last bone density was 2012. Pap done.The new Pap smear guidelines were discussed with the patient. Estradiol vaginal cream .02%-1 applicator full in vagina 3 times a week.

## 2012-09-01 ENCOUNTER — Other Ambulatory Visit: Payer: Self-pay | Admitting: Obstetrics and Gynecology

## 2012-09-01 ENCOUNTER — Ambulatory Visit (HOSPITAL_COMMUNITY)
Admission: RE | Admit: 2012-09-01 | Discharge: 2012-09-01 | Disposition: A | Discharge status: Home / Self Care | Payer: Medicare Other | Source: Ambulatory Visit | Attending: Gastroenterology | Admitting: Gastroenterology

## 2012-09-01 DIAGNOSIS — K219 Gastro-esophageal reflux disease without esophagitis: Secondary | ICD-10-CM | POA: Diagnosis not present

## 2012-09-01 DIAGNOSIS — K449 Diaphragmatic hernia without obstruction or gangrene: Secondary | ICD-10-CM | POA: Insufficient documentation

## 2012-09-01 DIAGNOSIS — R1310 Dysphagia, unspecified: Secondary | ICD-10-CM | POA: Insufficient documentation

## 2012-09-01 DIAGNOSIS — Z1231 Encounter for screening mammogram for malignant neoplasm of breast: Secondary | ICD-10-CM

## 2012-09-02 ENCOUNTER — Telehealth: Payer: Self-pay | Admitting: Gastroenterology

## 2012-09-02 DIAGNOSIS — R1310 Dysphagia, unspecified: Secondary | ICD-10-CM

## 2012-09-02 DIAGNOSIS — R933 Abnormal findings on diagnostic imaging of other parts of digestive tract: Secondary | ICD-10-CM

## 2012-09-02 NOTE — Telephone Encounter (Signed)
Notes Recorded by Mardella Layman, MD on 09/01/2012 at 12:34 PM I tried to call///needs thyroid ultrasound exam per radioolgy Exam,,,?? Extrinsic compression of the esophagus from her thyroid. I realize she's had previous thyroid surgery.      Scheduled pt for an U/S of Neck/Thyroid tomorrow am at 07:45am at [facility]; pt stated understanding. Pt wanted Dr Jarold Motto to know this past  summer she had episodes on and off of her voice being weak and cracking; she felt maybe it was her thyroid she just wanted you to know.

## 2012-09-03 ENCOUNTER — Telehealth: Payer: Self-pay | Admitting: Gastroenterology

## 2012-09-03 ENCOUNTER — Ambulatory Visit (HOSPITAL_COMMUNITY)
Admission: RE | Admit: 2012-09-03 | Discharge: 2012-09-03 | Disposition: A | Discharge status: Home / Self Care | Payer: Medicare Other | Source: Ambulatory Visit | Attending: Gastroenterology | Admitting: Gastroenterology

## 2012-09-03 ENCOUNTER — Telehealth: Payer: Self-pay | Admitting: *Deleted

## 2012-09-03 DIAGNOSIS — R1310 Dysphagia, unspecified: Secondary | ICD-10-CM

## 2012-09-03 DIAGNOSIS — E041 Nontoxic single thyroid nodule: Secondary | ICD-10-CM | POA: Diagnosis not present

## 2012-09-03 DIAGNOSIS — R933 Abnormal findings on diagnostic imaging of other parts of digestive tract: Secondary | ICD-10-CM

## 2012-09-03 NOTE — Telephone Encounter (Signed)
Informed pt of Dr Norval Gable findings and recommendations for a referral to Endocrinology. Pt has an appt with Dr Elvera Lennox at [facility] at [address]  [address] on 09/06/12 at 0800am. I have called Med Rec at [facility] for op notes for Thyroid Surgery in February or March, 1070 by Seward Speck, MD; I'm not sure of the hospital. Pt reports she knows she had most of her thyroid removed. Informed pt of her appt; she stated understanding.

## 2012-09-03 NOTE — Telephone Encounter (Signed)
Patient notified of need for OV. Per Misty Stanley patient scheduled to come in on Monday.

## 2012-09-03 NOTE — Telephone Encounter (Signed)
Message copied by Florene Glen on Fri Sep 03, 2012  9:28 AM ------      Message from: [place], DAVID R      Created: Fri Sep 03, 2012  9:02 AM       Referral to Dr. Everardo All in endocrinology for a thyroid dysfunction.  She probably will need thyroid biopsy.Gregary Signs, is lady is a good friend of mine I would appreciate it if you can see her as soon as possible.  She has dysphagia, and barium swallow suggest thyroid impingement on the upper cervical esophagus.  She does have a history of previous thyroid surgery and has a transverse neck scar.  Heart appreciate your opinion about her thyroid disease status and also whether or not you feel or could be a candidate for her intermittent dysphagia.  Radiology has suggested percutaneous biopsy

## 2012-09-03 NOTE — Telephone Encounter (Signed)
Message copied by Elnora Morrison on Fri Sep 03, 2012 10:59 AM ------      Message from: Romero Belling      Created: Fri Sep 03, 2012  9:36 AM       Ov today, new pt, with dr Reino Kent

## 2012-09-06 ENCOUNTER — Other Ambulatory Visit: Payer: Self-pay | Admitting: Internal Medicine

## 2012-09-06 ENCOUNTER — Encounter: Payer: Self-pay | Admitting: Internal Medicine

## 2012-09-06 ENCOUNTER — Ambulatory Visit (INDEPENDENT_AMBULATORY_CARE_PROVIDER_SITE_OTHER): Payer: Medicare Other | Admitting: Internal Medicine

## 2012-09-06 VITALS — BP 130/78 | HR 70 | Temp 98.3°F | Resp 16 | Wt 146.0 lb

## 2012-09-06 DIAGNOSIS — E041 Nontoxic single thyroid nodule: Secondary | ICD-10-CM

## 2012-09-06 NOTE — Progress Notes (Signed)
Subjective:     Patient ID: Meagan Key, female   DOB: 1946/02/13, 66 y.o.   MRN: [id number]  HPI Meagan Key is a pleasant 66 y/o WW referred by Dr. Creola Corn for evaluation and treatment of left thyroid nodule with compression signs.   Pt has a h/o partial thyroidectomy in 1970's for hyperthyroidectomy, but approx. 5 years ago developed dysphagia, with difficulty swallowing dry foods and sensation of food sticking in her throat. She also has a h/o GERD. She had an instance in which she had to go to the ED as pizza got stuck in her throat. She was referred to see GI, Dr. Sheryn Bison, who ordered a barium esophagogram. This revealed primary dysphagia, with extrinsic left sided compression on lower esophagus. A thyroid U/S was recommended and was obtained 4 days ago. This showed a hyperechoic heterogeneous nodule, with microcalcifications, measuring 2.1x2.0x1.6 cm (images were reviewed with the pt). She was then referred to endocrinology.  Pt admits for dysphagia, occasional hoarseness and fullness sensation when turning head to the left. Denies exposure to head/neck radiation or a FH of thyroid cancer.   No TSH available, but pt mentions it was normal 2 years ago.  Past Medical History  Diagnosis Date  . Hyperlipidemia   . Mild obesity   . Endometriosis   . Osteopenia   . Atrophic vaginitis   . Hypertension   . 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 Hematuria   . Reflux   . Hx of colonic polyp 06-12-2003    Colonoscopy Dr Madilyn Fireman  . Asthma   . Thyroid disease   . Allergy   . Chicken pox   . GERD (gastroesophageal reflux disease)     Past Surgical History  Procedure Date  . Thyroidectomy, partial AGE 56  . Laparoscopic endometriosis fulguration 1972  . Breast cyst excision AGE 35, 23, 56    bilateral  . Pelvic laparoscopy   . Eye surgery 2010    bilateral   . Appendectomy   . Tonsillectomy   . Oophorectomy 1969    RSO    History   Social History  . Marital Status:  Married    Spouse Name: N/A    Number of Children: N/A  . Years of Education: N/A   Occupational History  . Not on file.   Social History Main Topics  . Smoking status: Former Games developer  . Smokeless tobacco: Never Used  . Alcohol Use: 3.5 oz/week    7 drink(s) per week  . Drug Use: No  . Sexually Active: Yes    Birth Control/ Protection: Post-menopausal   Other Topics Concern  . Not on file   Social History Narrative   MARRIED; 2 CHILDRENLAW DEGREE; ATTORNEY    Current Outpatient Prescriptions on File Prior to Visit  Medication Sig Dispense Refill  . atorvastatin (LIPITOR) 10 MG tablet Take 10 mg by mouth daily.      . diphenhydrAMINE (BENADRYL) 25 MG tablet Take 25 mg by mouth 2 (two) times daily as needed. For allergy like symptoms.      . metoprolol succinate (TOPROL-XL) 50 MG 24 hr tablet Take 50 mg by mouth daily.      Marland Kitchen omeprazole-sodium bicarbonate (ZEGERID) 40-1100 MG per capsule Take 1 capsule by mouth daily before breakfast.  15 capsule  0  . ESTRADIOL [facility] vaginally.      . Ranitidine HCl (ZANTAC PO) Take by mouth.      . [DISCONTINUED] Calcium-Vitamin D-Vitamin  K (VIACTIV PO) Take by mouth daily.          Allergies  Allergen Reactions  . Nitrofurantoin Shortness Of Breath  . Lisinopril Swelling    Family History  Problem Relation Age of Onset  . Colon cancer Mother   . Alcohol abuse Mother   . Cancer Mother     colon  . Mental illness Mother   . Lung cancer Father   . Colon polyps Father   . Alcohol abuse Father   . Cancer Father     lung  . Hypertension Father   . Breast cancer Maternal Aunt     Age 79  . Heart attack Paternal Grandmother     Review of Systems Constitutional: no weight gain/loss, no fatigue, no subjective hyperthermia/hypothermia Eyes: no blurry vision, no xerophthalmia ENT: no sore throat, has dysphagia but not odynophagia, dry foods stuck in throat, voice can get hoarse, feels pressure when turns head to the  left Cardiovascular: no CP/SOB/palpitations/leg swelling Respiratory: no cough/SOB Gastrointestinal: no N/V/D/C Musculoskeletal: no muscle/joint aches Skin: no rashes Neurological: no tremors/numbness/tingling/dizziness Psychiatric: no depression/anxiety    Objective:   Physical Exam BP 130/78  Pulse 70  Temp 98.3 F (36.8 C) (Oral)  Resp 16  Wt 146 lb (66.225 kg)  SpO2 97% Constitutional: overweight, in NAD, looking younger than stated age Eyes: PERRLA, EOMI, no exophthalmos, no lid lag, no stare ENT: moist mucous membranes, no thyromegaly, left sided lower anterior nodule barely palpated - it is soft and moves freely with swallowing; no cervical lymphadenopathy Cardiovascular: RRR, No MRG Respiratory: CTA B Gastrointestinal: abdomen soft, NT, ND, BS+ Musculoskeletal: no deformities, strength intact in all 4 Skin: moist, warm, no rashes Neurological: no tremor with outstretched hands, DTR normal in all 4  Assessment:     1. Left mid-lobe thyroid nodule - 2.1x2.0x1.6 cm, hyperechoic, with microcalcifications - per thyroid U/S in 08/2012 - barium esophagogram - left lower cervical extrinsic indentation - >5 year h/o dysphagia    Plan:     - discussed with the patient about the need to obtain a tissue sample from the nodule to see if malignant. It is difficult to assess the risk of cancer, but I believe it is <10% for her. She does not have a h/o radiation to the head/neck or a FH of thyroid cancer, the nodule is not hypoechoic, and is relatively well delimited, however it is large and has microcalcifications, which increase the risk for cancer. - if FNA reveals malignancy >> will refer to surgery for total thyroidectomy  - if FNA reveals benign nodule and since she has neck compressive spx >> would refer her to surgery for hemithyroidectomy - we also discussed about a possible indeterminate result of the FNA, in this case, will likely need a repeat biopsy - briefly explained  the surgery and possible complications, including recurrent laryngeal nerve lesion, infection, bleeding - all rare. She would like to have the surgery at [facility] - I explained that,  in case she needs thyroid hormone supplementation (100% if total thyroidectomy, ~20% if hemithyroidectomy), this will need to be separated by her Zegerid and calcium by at least 4 h to maintain good absorption - for now, will get a TSH and a fT4 and will refer for FNA  - pt understands and agrees with the plan - RTC in 3 months (- will definitely need to see her 6-8 weeks after her surgery to check for thyroid hh therapy need)  Orders Only on  09/06/2012  Component Date Value Range Status  . TSH 09/06/2012 3.667  0.350 - 4.500 uIU/mL Final  . Free T4 09/06/2012 0.89  0.80 - 1.80 ng/dL Final  Normal thyroid tests. Pt scheduled for FNA on 09/08/12. Results pending.

## 2012-09-06 NOTE — Patient Instructions (Addendum)
You have a large left sided thyroid nodule (2.1x2.0x1.6 cm). As we discussed, I think we should proceed with a nodule biopsy (fine needle aspiration, FNA), which we will arrange and will call you with the date and time of the appointment. - if FNA reveals malignancy >> will refer you to surgery for total thyroidectomy - if FNA reveals that this is a benign nodule, since you have problems swallowing because of it >> I would refer you to surgery for hemithyroidectomy (taking out only half of your thyroid) - if the FNA is "indeterminate", we will likely need a repeat biopsy We will call you ASAP with the biopsy appointment.

## 2012-09-07 ENCOUNTER — Telehealth: Payer: Self-pay | Admitting: *Deleted

## 2012-09-07 NOTE — Telephone Encounter (Signed)
Tried to call pt. but not available, left VM that I will call later. I believe we should wait with the referral to ENT until we complete the workup for her thyroid nodule but will see what pt. thinks about this.

## 2012-09-07 NOTE — Telephone Encounter (Signed)
Pt needs a copy of the OV notes from Dr Elvera Lennox, [facility], Thyroid U/S; she also needs a CD of the BS and U/S. Pt will pick up our paperwork as well as the disc today.

## 2012-09-07 NOTE — Telephone Encounter (Signed)
PATIENT CALLED STATED SHE HAD DISCUSSED WITH YOU OF TROUBLE SHE IS HAVING WITH HER VOCAL CORDS AND WOULD LIKE A REFERRAL TO AN  ENT DOCTOR FOR THIS. STATES SHE IS HAVING HER THYROID BIOPSY TOMORROW AND WANTED TO GET THIS SEEN ABOUT ALSO. PLEASE ADVISE. Meagan Key

## 2012-09-07 NOTE — Telephone Encounter (Signed)
Talked to patient and decided to postpone the ENT referral for now. She has an ENT doctor as a friend who will review both her barium swallow and her thyroid ultrasound images and will make a suggestion whether to see ENT or not.   Upon our discussion she did understand that the best thing would be for now to proceed with either partial or total thyroidectomy (depending on the results of the FNA) and then reevaluate whether she needs ENT evaluation or not. She will have the thyroid FNA tomorrow and I will call her with the results as soon as they become available.   I did let her know that her recent TSH and her free T4 were normal.

## 2012-09-08 ENCOUNTER — Other Ambulatory Visit (HOSPITAL_COMMUNITY)
Admission: RE | Admit: 2012-09-08 | Discharge: 2012-09-08 | Disposition: A | Discharge status: Home / Self Care | Payer: Medicare Other | Source: Ambulatory Visit | Attending: Interventional Radiology | Admitting: Interventional Radiology

## 2012-09-08 ENCOUNTER — Telehealth: Payer: Self-pay | Admitting: Gastroenterology

## 2012-09-08 ENCOUNTER — Ambulatory Visit
Admission: RE | Admit: 2012-09-08 | Discharge: 2012-09-08 | Disposition: A | Discharge status: Home / Self Care | Payer: Medicare Other | Source: Ambulatory Visit | Attending: Internal Medicine | Admitting: Internal Medicine

## 2012-09-08 DIAGNOSIS — E049 Nontoxic goiter, unspecified: Secondary | ICD-10-CM | POA: Diagnosis not present

## 2012-09-08 DIAGNOSIS — E041 Nontoxic single thyroid nodule: Secondary | ICD-10-CM | POA: Diagnosis not present

## 2012-09-08 MED ORDER — ESOMEPRAZOLE MAGNESIUM 40 MG PO CPDR: 40.0000 mg | DELAYED_RELEASE_CAPSULE | Freq: Every day | ORAL | Status: DC

## 2012-09-08 NOTE — Telephone Encounter (Signed)
Called patient because she wanted Zegerid sent to her pharmacy.  I called patient because Zegerid was not covered under her formulary.  Patient said she tried Omeprazole but it made her nauseous.   Told patient that I am sending Nexium 40 mg for her to take once daily. Patient verbalized understanding

## 2012-09-13 ENCOUNTER — Encounter: Payer: Self-pay | Admitting: Internal Medicine

## 2012-09-13 NOTE — Progress Notes (Addendum)
   Pt called and informed. Also, I sent her a letter with the report. She would like to wait with the hemithyroidectomy. She will let me know if she would like to proceed with it at a later time. She will also let me know if she wants me to refer her to ENT.

## 2012-09-14 ENCOUNTER — Telehealth: Payer: Self-pay | Admitting: *Deleted

## 2012-09-14 ENCOUNTER — Ambulatory Visit (AMBULATORY_SURGERY_CENTER): Payer: Medicare Other

## 2012-09-14 VITALS — Ht 62.0 in | Wt 146.0 lb

## 2012-09-14 DIAGNOSIS — R1310 Dysphagia, unspecified: Secondary | ICD-10-CM

## 2012-09-14 NOTE — Telephone Encounter (Signed)
Per Dr Jarold Motto, schedule Meagan Key for EGD. Spoke with Meagan Key she will come today for her PV and 09/22/12 for her EGD.

## 2012-09-22 ENCOUNTER — Ambulatory Visit (AMBULATORY_SURGERY_CENTER): Payer: Medicare Other | Admitting: Gastroenterology

## 2012-09-22 ENCOUNTER — Other Ambulatory Visit: Payer: Self-pay | Admitting: *Deleted

## 2012-09-22 ENCOUNTER — Encounter: Payer: Self-pay | Admitting: Gastroenterology

## 2012-09-22 VITALS — BP 143/81 | HR 62 | Temp 97.8°F | Resp 25 | Ht 62.0 in | Wt 146.0 lb

## 2012-09-22 DIAGNOSIS — R1310 Dysphagia, unspecified: Secondary | ICD-10-CM

## 2012-09-22 DIAGNOSIS — J45909 Unspecified asthma, uncomplicated: Secondary | ICD-10-CM | POA: Diagnosis not present

## 2012-09-22 DIAGNOSIS — E785 Hyperlipidemia, unspecified: Secondary | ICD-10-CM | POA: Diagnosis not present

## 2012-09-22 DIAGNOSIS — I10 Essential (primary) hypertension: Secondary | ICD-10-CM | POA: Diagnosis not present

## 2012-09-22 DIAGNOSIS — E041 Nontoxic single thyroid nodule: Secondary | ICD-10-CM | POA: Diagnosis not present

## 2012-09-22 DIAGNOSIS — K219 Gastro-esophageal reflux disease without esophagitis: Secondary | ICD-10-CM | POA: Diagnosis not present

## 2012-09-22 DIAGNOSIS — K449 Diaphragmatic hernia without obstruction or gangrene: Secondary | ICD-10-CM

## 2012-09-22 DIAGNOSIS — G479 Sleep disorder, unspecified: Secondary | ICD-10-CM

## 2012-09-22 DIAGNOSIS — H409 Unspecified glaucoma: Secondary | ICD-10-CM | POA: Diagnosis not present

## 2012-09-22 DIAGNOSIS — E669 Obesity, unspecified: Secondary | ICD-10-CM | POA: Diagnosis not present

## 2012-09-22 MED ORDER — SODIUM CHLORIDE 0.9 % IV SOLN: 500.0000 mL | INTRAVENOUS | Status: DC

## 2012-09-22 NOTE — Op Note (Signed)
[facility] [address] [place], [address]   ENDOSCOPY PROCEDURE REPORT  PATIENT: Meagan, Key  MR#: [id number] BIRTHDATE: Dec 26, 1945 , 66  yrs. old GENDER: Female ENDOSCOPIST:Jerene Yeager Hale Bogus, MD, [facility] REFERRED BY: PROCEDURE DATE:  09/22/2012 PROCEDURE:   EGD, diagnostic and Maloney dilation of esophagus ASA CLASS:    Class II INDICATIONS: Dysphagia. MEDICATION: propofol (Diprivan) 300mg  IV TOPICAL ANESTHETIC:   Cetacaine Spray  DESCRIPTION OF PROCEDURE:   After the risks and benefits of the procedure were explained, informed consent was obtained.  The LB GIF-H180 [id number]  endoscope was introduced through the mouth  and advanced to the second portion of the duodenum .  The instrument was slowly withdrawn as the mucosa was fully examined.    The upper, middle and distal third of the esophagus were carefully inspected and no abnormalities were noted.  The z-line was well seen at the GEJ.  The endoscope was pushed into the fundus which was normal including a retroflexed view.  The antrum, gastric body, first and second part of the duodenum were unremarkable. A moderate sized hiatial hernia noted with free reflux during the exam.No Barrett's mucosa noted. Dilated with #62F [facility] dilator. Retroflexed views revealed a hiatal hernia.    The scope was then withdrawn from the patient and the procedure completed.  COMPLICATIONS: There were no complications.   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   RECOMMENDATIONS:DAILY PPI AND CHRONIC REFLUX REGIME,,,,,,WILL SCHEDULE HIGH RESOLUTION MANOMETRY... Continue PPI    _______________________________ eSigned:  Mardella Layman, MD, [facility] 09/22/2012 9:23 AM

## 2012-09-22 NOTE — Progress Notes (Signed)
0911 desaturation noted bite block removed and ambu bag at 100%. Suctioned.Tol well

## 2012-09-22 NOTE — Progress Notes (Signed)
Patient did not have preoperative order for IV antibiotic SSI prophylaxis. (G8918) Patient did not experience any of the following events: a burn prior to discharge; a fall within the facility; wrong site/side/patient/procedure/implant event; or a hospital transfer or hospital admission upon discharge from the facility. (G8907)   Charted by April Mirts RN 

## 2012-09-22 NOTE — Progress Notes (Signed)
Per Dr Jarold Motto, order Esophageal Manometry for pt and a referral to Dr Fannie Knee to evaluate for Sleep Apnea. Informed April in [facility] of appt with Dr Maple Hudson for 11/02/12 at 2:45pm; she will inform the pt.

## 2012-09-22 NOTE — Patient Instructions (Addendum)
YOU HAD AN ENDOSCOPIC PROCEDURE TODAY AT THE [facility]: Refer to the procedure report that was given to you for any specific questions about what was found during the examination.  If the procedure report does not answer your questions, please call your gastroenterologist to clarify.  If you requested that your care partner not be given the details of your procedure findings, then the procedure report has been included in a sealed envelope for you to review at your convenience later.  YOU SHOULD EXPECT: Some feelings of bloating in the abdomen. Passage of more gas than usual.  Walking can help get rid of the air that was put into your GI tract during the procedure and reduce the bloating. If you had a lower endoscopy (such as a colonoscopy or flexible sigmoidoscopy) you may notice spotting of blood in your stool or on the toilet paper. If you underwent a bowel prep for your procedure, then you may not have a normal bowel movement for a few days.  DIET: see instructions below   ACTIVITY: Your care partner should take you home directly after the procedure.  You should plan to take it easy, moving slowly for the rest of the day.  You can resume normal activity the day after the procedure however you should NOT DRIVE or use heavy machinery for 24 hours (because of the sedation medicines used during the test).    SYMPTOMS TO REPORT IMMEDIATELY: A gastroenterologist can be reached at any hour.  During normal business hours, 8:30 AM to 5:00 PM Monday through Friday, call [phone].  After hours and on weekends, please call the GI answering service at [phone] who will take a message and have the physician on call contact you.   Following upper endoscopy (EGD)  Vomiting of blood or coffee ground material  New chest pain or pain under the shoulder blades  Painful or persistently difficult swallowing  New shortness of breath  Fever of 100F or higher  Black, tarry-looking  stools  FOLLOW UP: If any biopsies were taken you will be contacted by phone or by letter within the next 1-3 weeks.  Call your gastroenterologist if you have not heard about the biopsies in 3 weeks.  Our staff will call the home number listed on your records the next business day following your procedure to check on you and address any questions or concerns that you may have at that time regarding the information given to you following your procedure. This is a courtesy call and so if there is no answer at the home number and we have not heard from you through the emergency physician on call, we will assume that you have returned to your regular daily activities without incident.  SIGNATURES/CONFIDENTIALITY: You and/or your care partner have signed paperwork which will be entered into your electronic medical record.  These signatures attest to the fact that that the information above on your After Visit Summary has been reviewed and is understood.  Full responsibility of the confidentiality of this discharge information lies with you and/or your care-partner.   Recommedations:  Hiatal hernia, GERD-handout given  Stricture- handout given  Dilation diet- handout given  Consult with Dr Maple Hudson November 02 2012 at 245pm

## 2012-09-23 ENCOUNTER — Telehealth: Payer: Self-pay

## 2012-09-23 NOTE — Telephone Encounter (Signed)
  Follow up Call-  Call back number 09/22/2012  Post procedure Call Back phone  # [phone]  Permission to leave phone message Yes     Patient questions:  Do you have a fever, pain , or abdominal swelling? no Pain Score  0 *  Have you tolerated food without any problems? yes  Have you been able to return to your normal activities? yes  Do you have any questions about your discharge instructions: Diet   no Medications  no Follow up visit  no  Do you have questions or concerns about your Care? no  Actions: * If pain score is 4 or above: No action needed, pain <4.

## 2012-09-24 DIAGNOSIS — D3130 Benign neoplasm of unspecified choroid: Secondary | ICD-10-CM | POA: Diagnosis not present

## 2012-09-24 DIAGNOSIS — H5200 Hypermetropia, unspecified eye: Secondary | ICD-10-CM | POA: Diagnosis not present

## 2012-09-24 DIAGNOSIS — H2510 Age-related nuclear cataract, unspecified eye: Secondary | ICD-10-CM | POA: Diagnosis not present

## 2012-09-24 DIAGNOSIS — H40039 Anatomical narrow angle, unspecified eye: Secondary | ICD-10-CM | POA: Diagnosis not present

## 2012-09-27 ENCOUNTER — Ambulatory Visit
Admission: RE | Admit: 2012-09-27 | Discharge: 2012-09-27 | Disposition: A | Discharge status: Home / Self Care | Payer: Medicare Other | Source: Ambulatory Visit | Attending: Obstetrics and Gynecology | Admitting: Obstetrics and Gynecology

## 2012-09-27 DIAGNOSIS — Z1231 Encounter for screening mammogram for malignant neoplasm of breast: Secondary | ICD-10-CM | POA: Diagnosis not present

## 2012-11-02 ENCOUNTER — Institutional Professional Consult (permissible substitution): Payer: Medicare Other | Admitting: Internal Medicine

## 2012-11-09 ENCOUNTER — Other Ambulatory Visit: Payer: Self-pay | Admitting: *Deleted

## 2012-11-09 MED ORDER — ESOMEPRAZOLE MAGNESIUM 40 MG PO CPDR: 40.0000 mg | DELAYED_RELEASE_CAPSULE | Freq: Every day | ORAL | Status: DC

## 2012-11-09 NOTE — Telephone Encounter (Signed)
PRESCRIPTION SENT.

## 2012-11-16 ENCOUNTER — Ambulatory Visit (INDEPENDENT_AMBULATORY_CARE_PROVIDER_SITE_OTHER): Payer: Medicare Other | Admitting: Internal Medicine

## 2012-11-16 ENCOUNTER — Encounter: Payer: Self-pay | Admitting: Internal Medicine

## 2012-11-16 VITALS — BP 128/76 | HR 72 | Ht 62.0 in | Wt 148.6 lb

## 2012-11-16 DIAGNOSIS — R490 Dysphonia: Secondary | ICD-10-CM | POA: Diagnosis not present

## 2012-11-16 DIAGNOSIS — G4733 Obstructive sleep apnea (adult) (pediatric): Secondary | ICD-10-CM

## 2012-11-16 NOTE — Progress Notes (Signed)
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 She had upper endoscopy and was told she seemed to have sleep apnea. Told she had a "small mouth" Does snore. Cites "busy brain" during night, with high energy, busy day without daytime sleepiness.  Bedtime 9-10PM, short sleep latency, waking 0-2 times before up 6AM.  Medical hx of GERD w/ stricture, HBP, Asthma. No ENT surgery. Hx partial thyroidectomy. She is also concerned about voice change "reedy" comes and goes with no pattern Interested in ENT evaluation.   Prior to Admission medications   Medication Sig Start Date End Date Taking? Authorizing Provider  atorvastatin (LIPITOR) 10 MG tablet Take 10 mg by mouth daily. 08/26/11 09/25/13 Yes Rosalio Macadamia, NP  diphenhydrAMINE (BENADRYL) 25 MG tablet Take 25 mg by mouth 2 (two) times daily as needed. For allergy like symptoms.   Yes Historical Provider, MD  esomeprazole (NEXIUM) 40 MG capsule Take 1 capsule (40 mg total) by mouth daily. 11/09/12 11/09/13 Yes Mardella Layman, MD  metoprolol succinate (TOPROL-XL) 50 MG 24 hr tablet Take 50 mg by mouth daily. 03/10/11  Yes Roger Shelter, MD  ESTRADIOL [facility] vaginally as needed.     Historical Provider, MD   Past Medical History  Diagnosis Date  . Hyperlipidemia   . Mild obesity   . Endometriosis   . Osteopenia   . Atrophic vaginitis   . Hypertension   . 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 Hematuria   . Hx of colonic polyp 06-12-2003    Colonoscopy Dr Madilyn Fireman  . Asthma   . Thyroid disease     nodule on thyroid  . Allergy   . Chicken pox   . GERD (gastroesophageal reflux disease)   . Hypertension   . Hyperlipemia    Past Surgical History  Procedure Date  . Thyroidectomy, partial AGE 67  . Laparoscopic endometriosis fulguration 1972  . Breast cyst excision AGE 67, 23, 67    bilateral  . Pelvic laparoscopy   . Eye surgery 2010    bilateral   . Appendectomy   . Tonsillectomy    . Oophorectomy 1969    RSO  . Colonoscopy   . Polypectomy    Family History  Problem Relation Age of Onset  . Colon cancer Mother   . Alcohol abuse Mother   . Mental illness Mother   . Lung cancer Father     smoker  . Colon polyps Father   . Alcohol abuse Father   . Hypertension Father   . Breast cancer Maternal Aunt     Age 18  . Heart attack Paternal Grandmother   . Other Cousin     mother side-vocal cord poylps   History   Social History  . Marital Status: Married    Spouse Name: N/A    Number of Children: 2  . Years of Education: N/A   Occupational History  . attorney    Social History Main Topics  . Smoking status: Former Smoker -- 0.3 packs/day for 25 years    Types: Cigarettes    Quit date: 09/14/1984  . Smokeless tobacco: Never Used  . Alcohol Use: 3.5 oz/week    7 drink(s) per week  . Drug Use: No  . Sexually Active: Yes    Birth Control/ Protection: Post-menopausal   Other Topics Concern  . Not on file   Social History Narrative   MARRIED; 2 CHILDRENLAW DEGREE;  ATTORNEY   ROS-see HPI Constitutional:   No-   weight loss, night sweats, fevers, chills, fatigue, lassitude. HEENT:   No-  headaches, difficulty swallowing, tooth/dental problems, sore throat,       No-  sneezing, itching, ear ache, nasal congestion, post nasal drip,  CV:  No-   chest pain, orthopnea, PND, swelling in lower extremities, anasarca,                                  dizziness, palpitations Resp: No-   shortness of breath with exertion or at rest.              No-   productive cough,  No non-productive cough,  No- coughing up of blood.              No-   change in color of mucus.  No- wheezing.   Skin: No-   rash or lesions. GI:  + heartburn, No-indigestion, abdominal pain, nausea, vomiting, diarrhea,                 change in bowel habits, loss of appetite GU: No-   dysuria, change in color of urine, no urgency or frequency.  No- flank pain. MS:  No-   joint pain or  swelling.  No- decreased range of motion.  No- back pain. Neuro-     nothing unusual Psych:  No- change in mood or affect. No depression or anxiety.  No memory loss.  OBJ- Physical Exam General- Alert, Oriented, Affect-appropriate, Distress- none acute, medium build Skin- rash-none, lesions- none, excoriation- none Lymphadenopathy- none Head- atraumatic            Eyes- Gross vision intact, PERRLA, conjunctivae and secretions clear            Ears- Hearing, canals-normal            Nose- Clear, no-Septal dev, mucus, polyps, erosion, perforation             Throat- Mallampati III-IV , mucosa clear , drainage- none, tonsils- atrophic, articulate Neck- flexible , trachea midline, no stridor , thyroid surgical scar and ?residual goiter , carotid no bruit Chest - symmetrical excursion , unlabored           Heart/CV- RRR , no murmur , no gallop  , no rub, nl s1 s2                           - JVD- none , edema- none, stasis changes- none, varices- none           Lung- clear to P&A, wheeze- none, cough- none , dullness-none, rub- none           Chest wall-  Abd- tender-no, distended-no, bowel sounds-present, HSM- no Br/ Gen/ Rectal- Not done, not indicated Extrem- cyanosis- none, clubbing, none, atrophy- none, strength- nl Neuro- grossly intact to observation

## 2012-11-16 NOTE — Patient Instructions (Addendum)
Order- Meagan Key unattended home sleep study    Dx OSA  Order- referral to [facility]   Dysphonia

## 2012-11-19 DIAGNOSIS — R490 Dysphonia: Secondary | ICD-10-CM | POA: Insufficient documentation

## 2012-11-19 DIAGNOSIS — G4733 Obstructive sleep apnea (adult) (pediatric): Secondary | ICD-10-CM | POA: Insufficient documentation

## 2012-11-19 NOTE — Assessment & Plan Note (Signed)
Complaint of "reedy voice" in former smoker with previous intubations and GERD. She speaks a lot in her work. This may just be some bowing of cords, but we will ask ENT for evaluation, anticipating visualization of cord movement.

## 2012-11-19 NOTE — Assessment & Plan Note (Signed)
Based on observation during conscious sedation. Does have relatively small mouth and long palate, probably difficult airway to intubate. Hypertension hx. We discussed the medical concerns of sleep apnea and the testing process.  Plan- Schedule NPSG

## 2012-11-22 DIAGNOSIS — G4733 Obstructive sleep apnea (adult) (pediatric): Secondary | ICD-10-CM

## 2012-11-23 DIAGNOSIS — R490 Dysphonia: Secondary | ICD-10-CM | POA: Diagnosis not present

## 2012-11-23 DIAGNOSIS — K219 Gastro-esophageal reflux disease without esophagitis: Secondary | ICD-10-CM | POA: Diagnosis not present

## 2012-11-29 ENCOUNTER — Encounter (HOSPITAL_COMMUNITY)
Admission: RE | Disposition: A | Discharge status: Home / Self Care | Payer: Self-pay | Source: Ambulatory Visit | Attending: Gastroenterology

## 2012-11-29 ENCOUNTER — Telehealth: Payer: Self-pay | Admitting: *Deleted

## 2012-11-29 ENCOUNTER — Encounter (HOSPITAL_COMMUNITY): Admission: RE | Payer: Self-pay | Source: Ambulatory Visit

## 2012-11-29 ENCOUNTER — Ambulatory Visit (HOSPITAL_COMMUNITY)
Admission: RE | Admit: 2012-11-29 | Discharge: 2012-11-29 | Disposition: A | Discharge status: Home / Self Care | Payer: Medicare Other | Source: Ambulatory Visit | Attending: Gastroenterology | Admitting: Gastroenterology

## 2012-11-29 ENCOUNTER — Ambulatory Visit (HOSPITAL_COMMUNITY): Admission: RE | Admit: 2012-11-29 | Payer: Medicare Other | Source: Ambulatory Visit | Admitting: Gastroenterology

## 2012-11-29 ENCOUNTER — Encounter (HOSPITAL_COMMUNITY): Payer: Self-pay

## 2012-11-29 DIAGNOSIS — R1310 Dysphagia, unspecified: Secondary | ICD-10-CM | POA: Insufficient documentation

## 2012-11-29 DIAGNOSIS — K219 Gastro-esophageal reflux disease without esophagitis: Secondary | ICD-10-CM

## 2012-11-29 DIAGNOSIS — R1314 Dysphagia, pharyngoesophageal phase: Secondary | ICD-10-CM | POA: Diagnosis not present

## 2012-11-29 DIAGNOSIS — I959 Hypotension, unspecified: Secondary | ICD-10-CM | POA: Insufficient documentation

## 2012-11-29 DIAGNOSIS — K224 Dyskinesia of esophagus: Secondary | ICD-10-CM | POA: Insufficient documentation

## 2012-11-29 HISTORY — PX: ESOPHAGEAL MANOMETRY: SHX5429

## 2012-11-29 SURGERY — MANOMETRY, ESOPHAGUS

## 2012-11-29 MED ORDER — LIDOCAINE VISCOUS 2 % MT SOLN
OROMUCOSAL | Status: AC
  Filled 2012-11-29: qty 15

## 2012-11-29 NOTE — Telephone Encounter (Signed)
Leanne from [facility] END[facility], called to report pt has a cold and is concerned about the EM. Leanne states their policy is to have the pt bring Afrin nasal spray and everything should be OK. Need something from Dr Jarold Motto stating it's OK for pt to use the spray. Spoke with Dr Jarold Motto, OK for pt to use Afrin prior to Esophageal Manometry.

## 2012-11-30 ENCOUNTER — Encounter (HOSPITAL_COMMUNITY): Payer: Self-pay | Admitting: Gastroenterology

## 2012-12-09 ENCOUNTER — Encounter: Payer: Self-pay | Admitting: Gastroenterology

## 2012-12-09 ENCOUNTER — Ambulatory Visit (INDEPENDENT_AMBULATORY_CARE_PROVIDER_SITE_OTHER): Payer: Medicare Other | Admitting: Gastroenterology

## 2012-12-09 VITALS — BP 130/80 | HR 74 | Ht 62.0 in | Wt 147.2 lb

## 2012-12-09 DIAGNOSIS — R1314 Dysphagia, pharyngoesophageal phase: Secondary | ICD-10-CM

## 2012-12-09 DIAGNOSIS — K224 Dyskinesia of esophagus: Secondary | ICD-10-CM

## 2012-12-09 DIAGNOSIS — K219 Gastro-esophageal reflux disease without esophagitis: Secondary | ICD-10-CM | POA: Diagnosis not present

## 2012-12-09 NOTE — Progress Notes (Signed)
History of Present Illness: This is a 67 year old Caucasian female with chronic GERD.  She's had endoscopy and esophageal dilatation.  She been evaluated for sleep apnea by Dr. Maple Hudson which apparently was negative, and has had ENT evaluation by Dr. Jenne Pane which was apparently unremarkable except for changes of acid reflux in her history of pharynx  He elevated her Nexium to one 40mg  a day to twice a day.  High-resolution esophageal manometry was completed all February 10.  Shows an esophageal motility disorder with LES pressure of 17 mm of mercury with normal residual pressure.  She has a hypotensive aperistaltic esophagus with a DCI of 180, normal [phone], normal contraction front velocity of 3.2, normal less than 9, but 100% of boluses  incompletely cleared and failed peristalsis  in 50% of swallows.  This is consistent with a hypotensive esophagus with mild peristaltic dysfunction and normal LES function not c/w achalasia.    Current Medications, Allergies, Past Medical History, Past Surgical History, Family History and Social History were reviewed in [employer] record.  ROS: All systems were reviewed and are negative unless otherwise stated in the HPI.         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  Recent endoscopy was remarkable for dilation of an esophageal stricture.  She will need long-term PPI therapy and, probably Nexium one to 2 times a day as her symptomatology.  I see no indication for prokinetic therapy at this time.  Standard antireflux maneuvers reviewed with patient.  She is to call me should she have recurrence of her dysphagia issues.  I have asked her to eat her food slowly with liberal liquids, and to avoid large boluses of food.  I suspectENT problems on the basis of acid regurgitation and an adequate clearance of acid from her esophagus per esophageal motility dysfunction.  Copy  Dr. Karlyn Agee and Dr. Fannie Knee

## 2012-12-09 NOTE — Patient Instructions (Signed)
Please start Caltrate 600 with vitamin D.  Please follow up in one year or as needed.

## 2012-12-13 ENCOUNTER — Encounter: Payer: Self-pay | Admitting: Internal Medicine

## 2012-12-20 ENCOUNTER — Encounter: Payer: Self-pay | Admitting: Internal Medicine

## 2012-12-20 ENCOUNTER — Ambulatory Visit (INDEPENDENT_AMBULATORY_CARE_PROVIDER_SITE_OTHER): Payer: Medicare Other | Admitting: Internal Medicine

## 2012-12-20 ENCOUNTER — Ambulatory Visit: Payer: Medicare Other | Admitting: Internal Medicine

## 2012-12-20 VITALS — BP 142/88 | HR 87 | Ht 62.0 in | Wt 149.0 lb

## 2012-12-20 DIAGNOSIS — G4733 Obstructive sleep apnea (adult) (pediatric): Secondary | ICD-10-CM | POA: Diagnosis not present

## 2012-12-20 NOTE — Assessment & Plan Note (Signed)
She wishes to manage her mild OSA conservatively- sleeping off flat of back, keeping weight down, managing nasal congestion. She will return if needed.

## 2012-12-20 NOTE — Patient Instructions (Addendum)
I am pleased that conservative measures make sense for you. If you find you need more help, please let me know.

## 2012-12-20 NOTE — Progress Notes (Signed)
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 She had upper endoscopy and was told she seemed to have sleep apnea. Told she had a "small mouth" Does snore. Cites "busy brain" during night, with high energy, busy day without daytime sleepiness.  Bedtime 9-10PM, short sleep latency, waking 0-2 times before up 6AM.  Medical hx of GERD w/ stricture, HBP, Asthma. No ENT surgery. Hx partial thyroidectomy. She is also concerned about voice change "reedy" comes and goes with no pattern Interested in ENT evaluation.  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 Unattended home sleep study-11/22/2012-mild obstructive sleep apnea, AHI 10.8 per hour, weight 148 pounds Dr Jenne Pane ENT told her that vocal cord anatomy looked normal with suggestion of reflux. She had barium swallow demonstrating esophageal dysphagia. Now taking Nexium twice daily, she can lie on her sides and stomach to sleep. She also got new pillow and new encasing. She feels this takes care of her sleep disordered breathing well enough. She would rather not try CPAP or an oral appliance at this time.  ROS-see HPI Constitutional:   No-   weight loss, night sweats, fevers, chills, fatigue, lassitude. HEENT:   No-  headaches, difficulty swallowing, tooth/dental problems, sore throat,       No-  sneezing, itching, ear ache, nasal congestion, post nasal drip,  CV:  No-   chest pain, orthopnea, PND, swelling in lower extremities, anasarca,                                  dizziness, palpitations Resp: No-   shortness of breath with exertion or at rest.              No-   productive cough,  No non-productive cough,  No- coughing up of blood.              No-   change in color of mucus.  No- wheezing.   Skin: No-   rash or lesions. GI:  + heartburn, No-indigestion, abdominal pain, nausea, vomiting, GU: No-   dysuria, change in  color of urine, no urgency or frequency.  No- flank pain. MS:  No-   joint pain or swelling.   Neuro-     nothing unusual Psych:  No- change in mood or affect. No depression or anxiety.  No memory loss.  OBJ- Physical Exam General- Alert, Oriented, Affect-appropriate, Distress- none acute, medium build Skin- rash-none, lesions- none, excoriation- none Lymphadenopathy- none Head- atraumatic            Eyes- Gross vision intact, PERRLA, conjunctivae and secretions clear            Ears- Hearing, canals-normal            Nose- Clear, no-Septal dev, mucus, polyps, erosion, perforation             Throat- Mallampati III-IV , mucosa clear , drainage- none, tonsils- atrophic, articulate Neck- flexible , trachea midline, no stridor , thyroid surgical scar and ?residual goiter , carotid no bruit Chest - symmetrical excursion , unlabored           Heart/CV- RRR , no murmur , no gallop  , no rub, nl s1 s2                           -  JVD- none , edema- none, stasis changes- none, varices- none           Lung- clear to P&A, wheeze- none, cough- none , dullness-none, rub- none           Chest wall-  Abd-  Br/ Gen/ Rectal- Not done, not indicated Extrem- cyanosis- none, clubbing, none, atrophy- none, strength- nl Neuro- grossly intact to observation

## 2013-01-26 DIAGNOSIS — R490 Dysphonia: Secondary | ICD-10-CM | POA: Diagnosis not present

## 2013-01-26 DIAGNOSIS — K219 Gastro-esophageal reflux disease without esophagitis: Secondary | ICD-10-CM | POA: Diagnosis not present

## 2013-02-10 DIAGNOSIS — E049 Nontoxic goiter, unspecified: Secondary | ICD-10-CM | POA: Diagnosis not present

## 2013-02-10 DIAGNOSIS — E785 Hyperlipidemia, unspecified: Secondary | ICD-10-CM | POA: Diagnosis not present

## 2013-02-10 DIAGNOSIS — I10 Essential (primary) hypertension: Secondary | ICD-10-CM | POA: Diagnosis not present

## 2013-02-10 DIAGNOSIS — E559 Vitamin D deficiency, unspecified: Secondary | ICD-10-CM | POA: Diagnosis not present

## 2013-02-17 DIAGNOSIS — E785 Hyperlipidemia, unspecified: Secondary | ICD-10-CM | POA: Diagnosis not present

## 2013-02-17 DIAGNOSIS — K219 Gastro-esophageal reflux disease without esophagitis: Secondary | ICD-10-CM | POA: Diagnosis not present

## 2013-02-17 DIAGNOSIS — Z1331 Encounter for screening for depression: Secondary | ICD-10-CM | POA: Diagnosis not present

## 2013-02-17 DIAGNOSIS — H409 Unspecified glaucoma: Secondary | ICD-10-CM | POA: Diagnosis not present

## 2013-02-17 DIAGNOSIS — Z0000 Encounter for general adult medical examination without abnormal findings: Secondary | ICD-10-CM | POA: Diagnosis not present

## 2013-02-17 DIAGNOSIS — I10 Essential (primary) hypertension: Secondary | ICD-10-CM | POA: Diagnosis not present

## 2013-02-17 DIAGNOSIS — Z1212 Encounter for screening for malignant neoplasm of rectum: Secondary | ICD-10-CM | POA: Diagnosis not present

## 2013-02-17 DIAGNOSIS — R1310 Dysphagia, unspecified: Secondary | ICD-10-CM | POA: Insufficient documentation

## 2013-02-17 DIAGNOSIS — Z6826 Body mass index (BMI) 26.0-26.9, adult: Secondary | ICD-10-CM | POA: Diagnosis not present

## 2013-02-17 DIAGNOSIS — Z23 Encounter for immunization: Secondary | ICD-10-CM | POA: Diagnosis not present

## 2013-02-17 DIAGNOSIS — E049 Nontoxic goiter, unspecified: Secondary | ICD-10-CM | POA: Diagnosis not present

## 2013-05-20 DIAGNOSIS — M8580 Other specified disorders of bone density and structure, unspecified site: Secondary | ICD-10-CM

## 2013-05-20 HISTORY — DX: Other specified disorders of bone density and structure, unspecified site: M85.80

## 2013-06-06 DIAGNOSIS — K219 Gastro-esophageal reflux disease without esophagitis: Secondary | ICD-10-CM | POA: Diagnosis not present

## 2013-06-15 ENCOUNTER — Encounter: Payer: Self-pay | Admitting: Gynecology

## 2013-06-15 DIAGNOSIS — M899 Disorder of bone, unspecified: Secondary | ICD-10-CM | POA: Diagnosis not present

## 2013-06-29 ENCOUNTER — Encounter: Payer: Self-pay | Admitting: Gynecology

## 2013-08-05 DIAGNOSIS — Z23 Encounter for immunization: Secondary | ICD-10-CM | POA: Diagnosis not present

## 2013-08-11 DIAGNOSIS — L821 Other seborrheic keratosis: Secondary | ICD-10-CM | POA: Diagnosis not present

## 2013-09-07 ENCOUNTER — Encounter: Payer: Self-pay | Admitting: Gynecology

## 2013-09-07 ENCOUNTER — Ambulatory Visit (INDEPENDENT_AMBULATORY_CARE_PROVIDER_SITE_OTHER): Payer: Medicare Other | Admitting: Gynecology

## 2013-09-07 VITALS — BP 124/82 | Ht 62.0 in | Wt 146.0 lb

## 2013-09-07 DIAGNOSIS — N952 Postmenopausal atrophic vaginitis: Secondary | ICD-10-CM

## 2013-09-07 DIAGNOSIS — M899 Disorder of bone, unspecified: Secondary | ICD-10-CM

## 2013-09-07 DIAGNOSIS — M8580 Other specified disorders of bone density and structure, unspecified site: Secondary | ICD-10-CM

## 2013-09-07 MED ORDER — OSPEMIFENE 60 MG PO TABS: 60.0000 mg | ORAL_TABLET | Freq: Every day | ORAL | Status: DC

## 2013-09-07 NOTE — Progress Notes (Signed)
This is well below one year if Meagan Key [birth year] [id number]        67 y.o.  [id number] for followup exam.  Former patient of Dr. Eda Paschal. Several issues noted below.  Past medical history,surgical history, problem list, medications, allergies, family history and social history were all reviewed and documented in the EPIC chart.  ROS:  Performed and pertinent positives and negatives are included in the history, assessment and plan .  Exam: Kim assistant Filed Vitals:   09/07/13 1118  BP: 124/82  Height: 5\' 2"  (1.575 m)  Weight: 146 lb (66.225 kg)   General appearance  Normal Skin grossly normal Head/Neck normal with no cervical or supraclavicular adenopathy thyroid normal Lungs  clear Cardiac RR, without RMG Abdominal  soft, nontender, without masses, organomegaly or hernia Breasts  examined lying and sitting without masses, retractions, discharge or axillary adenopathy. Pelvic  Ext/BUS/vagina  normal with atrophic changes  Cervix  normal with atrophic changes  Uterus  anteverted, normal size, shape and contour, midline and mobile nontender   Adnexa  Without masses or tenderness    Anus and perineum  normal   Rectovaginal  normal sphincter tone without palpated masses or tenderness.    Assessment/Plan:  67 y.o. [id number] female for annual exam.   1. Postmenopausal/atrophic genital changes. Patient had been on HRT previously but has weaned herself off. Is having issues with vaginal dryness dyspareunia. Has tried Estring, vaginal estrogen cream and Vagifem. Is unhappy with any of these. Discussed Osphena. Mechanism of action and possible tissue fracture including breast bones coagulation and uterus. Patient unsure if she wants to try. Side effect profile reviewed. Written prescription given at her choice and she'll decide she wants to start this. 2. Osteopenia. DEXA through Dr. Ferd Hibbs office 2014 T score -2.3 FRAX 10%/1.6%. Had been on Fosamax transiently by her history for one  to 2 years but has discontinued this 9 years ago. Plan per Dr. Kennedi Lizardo Lasso is to continue to observe with repeat DEXA 2 year interval. Increase calcium vitamin D reviewed. 3. Pap smear 2013. No Pap smear done today. No history of abnormal Pap smears previously. Current screening guidelines reviewed. Options to stop screening altogether as she is over the age of 31 versus less frequent screening intervals reviewed. Will readdress on an annual basis. 4. Mammography coming due in December and she will schedule this. SBE monthly reviewed. Colonoscopy 2012. Repeat at their recommended interval. 5. Health maintenance. No labs done as they are all done through her primary physician's office. Followup in one year, sooner as needed.  Note: This document was prepared with digital dictation and possible smart phrase technology. Any transcriptional errors that result from this process are unintentional.   Dara Lords MD, 11:57 AM 09/07/2013

## 2013-09-07 NOTE — Patient Instructions (Signed)
If you choose to start on Osphena let me know how your doing after a month or so. Follow up in one year for annual exam.

## 2013-09-13 ENCOUNTER — Other Ambulatory Visit: Payer: Self-pay

## 2013-09-13 DIAGNOSIS — Z1231 Encounter for screening mammogram for malignant neoplasm of breast: Secondary | ICD-10-CM

## 2013-09-13 DIAGNOSIS — I10 Essential (primary) hypertension: Secondary | ICD-10-CM | POA: Diagnosis not present

## 2013-09-13 DIAGNOSIS — Z6826 Body mass index (BMI) 26.0-26.9, adult: Secondary | ICD-10-CM | POA: Diagnosis not present

## 2013-09-27 DIAGNOSIS — D3130 Benign neoplasm of unspecified choroid: Secondary | ICD-10-CM | POA: Diagnosis not present

## 2013-09-27 DIAGNOSIS — H25019 Cortical age-related cataract, unspecified eye: Secondary | ICD-10-CM | POA: Diagnosis not present

## 2013-09-27 DIAGNOSIS — H2510 Age-related nuclear cataract, unspecified eye: Secondary | ICD-10-CM | POA: Diagnosis not present

## 2013-09-27 DIAGNOSIS — H268 Other specified cataract: Secondary | ICD-10-CM | POA: Diagnosis not present

## 2013-10-26 ENCOUNTER — Ambulatory Visit
Admission: RE | Admit: 2013-10-26 | Discharge: 2013-10-26 | Disposition: A | Discharge status: Home / Self Care | Payer: Medicare Other | Source: Ambulatory Visit

## 2013-10-26 DIAGNOSIS — Z1231 Encounter for screening mammogram for malignant neoplasm of breast: Secondary | ICD-10-CM | POA: Diagnosis not present

## 2013-12-20 ENCOUNTER — Telehealth: Payer: Self-pay | Admitting: *Deleted

## 2013-12-20 DIAGNOSIS — J069 Acute upper respiratory infection, unspecified: Secondary | ICD-10-CM | POA: Diagnosis not present

## 2013-12-20 DIAGNOSIS — J45909 Unspecified asthma, uncomplicated: Secondary | ICD-10-CM | POA: Diagnosis not present

## 2013-12-20 DIAGNOSIS — Z6825 Body mass index (BMI) 25.0-25.9, adult: Secondary | ICD-10-CM | POA: Diagnosis not present

## 2013-12-20 NOTE — Telephone Encounter (Signed)
(  Late entry from 12/19/13 )Pt called c/o some lower back pain asked if could be related to taking Osphena? I called and left message on pt voicemail that OV best unable to diagnosis problem over phone.

## 2013-12-30 ENCOUNTER — Ambulatory Visit (INDEPENDENT_AMBULATORY_CARE_PROVIDER_SITE_OTHER): Payer: Medicare Other | Admitting: Gynecology

## 2013-12-30 ENCOUNTER — Encounter: Payer: Self-pay | Admitting: Gynecology

## 2013-12-30 DIAGNOSIS — M549 Dorsalgia, unspecified: Secondary | ICD-10-CM

## 2013-12-30 DIAGNOSIS — R5383 Other fatigue: Secondary | ICD-10-CM

## 2013-12-30 DIAGNOSIS — R141 Gas pain: Secondary | ICD-10-CM

## 2013-12-30 DIAGNOSIS — R143 Flatulence: Secondary | ICD-10-CM

## 2013-12-30 DIAGNOSIS — R5381 Other malaise: Secondary | ICD-10-CM | POA: Diagnosis not present

## 2013-12-30 DIAGNOSIS — R142 Eructation: Secondary | ICD-10-CM

## 2013-12-30 DIAGNOSIS — R140 Abdominal distension (gaseous): Secondary | ICD-10-CM

## 2013-12-30 LAB — CBC WITH DIFFERENTIAL/PLATELET
Basophils Absolute: 0.1 K/uL (ref 0.0–0.1)
Basophils Relative: 1 % (ref 0–1)
Eosinophils Absolute: 0.1 K/uL (ref 0.0–0.7)
Eosinophils Relative: 2 % (ref 0–5)
HCT: 36.5 % (ref 36.0–46.0)
Hemoglobin: 12.4 g/dL (ref 12.0–15.0)
Lymphocytes Relative: 31 % (ref 12–46)
Lymphs Abs: 1.7 K/uL (ref 0.7–4.0)
MCH: 28.7 pg (ref 26.0–34.0)
MCHC: 34 g/dL (ref 30.0–36.0)
MCV: 84.5 fL (ref 78.0–100.0)
Monocytes Absolute: 0.4 K/uL (ref 0.1–1.0)
Monocytes Relative: 7 % (ref 3–12)
Neutro Abs: 3.2 K/uL (ref 1.7–7.7)
Neutrophils Relative %: 59 % (ref 43–77)
Platelets: 257 K/uL (ref 150–400)
RBC: 4.32 MIL/uL (ref 3.87–5.11)
RDW: 14.4 % (ref 11.5–15.5)
WBC: 5.4 K/uL (ref 4.0–10.5)

## 2013-12-30 LAB — COMPREHENSIVE METABOLIC PANEL
ALT: 12 U/L (ref 0–35)
AST: 14 U/L (ref 0–37)
Albumin: 3.8 g/dL (ref 3.5–5.2)
Alkaline Phosphatase: 47 U/L (ref 39–117)
BUN: 21 mg/dL (ref 6–23)
CALCIUM: 8.6 mg/dL (ref 8.4–10.5)
CHLORIDE: 102 meq/L (ref 96–112)
CO2: 28 meq/L (ref 19–32)
CREATININE: 0.87 mg/dL (ref 0.50–1.10)
Glucose, Bld: 63 mg/dL — ABNORMAL LOW (ref 70–99)
Potassium: 4.3 mEq/L (ref 3.5–5.3)
Sodium: 137 mEq/L (ref 135–145)
Total Bilirubin: 0.4 mg/dL (ref 0.2–1.2)
Total Protein: 6.4 g/dL (ref 6.0–8.3)

## 2013-12-30 LAB — URINALYSIS W MICROSCOPIC + REFLEX CULTURE
Bilirubin Urine: NEGATIVE
Casts: NONE SEEN
Crystals: NONE SEEN
Glucose, UA: NEGATIVE mg/dL
Ketones, ur: NEGATIVE mg/dL
Nitrite: NEGATIVE
Protein, ur: NEGATIVE mg/dL
Specific Gravity, Urine: <1.005 — ABNORMAL LOW (ref 1.005–1.030)
Urobilinogen, UA: 0.2 mg/dL (ref 0.0–1.0)
pH: 5.5 (ref 5.0–8.0)

## 2013-12-30 LAB — TSH: TSH: 3.7 u[IU]/mL (ref 0.350–4.500)

## 2013-12-30 NOTE — Patient Instructions (Signed)
Follow up for ultrasound as scheduled 

## 2013-12-30 NOTE — Addendum Note (Signed)
Addended by: Nelva Nay on: 12/30/2013 10:05 AM   Modules accepted: Orders

## 2013-12-30 NOTE — Progress Notes (Signed)
Meagan Key 01-Jul-1946 [id number]        68 y.o.  G2P2002 presents with two-week history of abdominal bloating and diffuse discomfort. History of gastroenteritis several weeks ago with diarrhea and cramping. This resolved and then she subsequent developed a respiratory asthma attack. After this was all over she notes low back pain which radiates around to the anterior abdomen with diffuse discomfort.  No nausea vomiting diarrhea constipation. No urinary symptoms such as frequency dysuria or urgency. She also notes fatigue and just overall not feeling well. She is concerned about ovarian cancer. Does have history of RSO in the past and a history of endometriosis. Also a history of partial thyroidectomy  Past medical history,surgical history, problem list, medications, allergies, family history and social history were all reviewed and documented in the EPIC chart.  ROS:  Performed and pertinent positives and negatives are included in the history, assessment and plan .  Exam: Kim assistant There were no vitals filed for this visit. General appearance  Normal Skin grossly normal Head/Neck normal with no cervical or supraclavicular adenopathy thyroid normal with well-healed midline scar Lungs  clear Cardiac RR, without RMG Abdominal  soft, nontender, without masses, organomegaly or hernia Pelvic  Ext/BUS/vagina with atrophic changes  Cervix with atrophic changes  Uterus anteverted axial, normal size, shape and contour, midline and mobile nontender   Adnexa  Without masses or tenderness    Anus and perineum  Normal   Rectovaginal  Normal sphincter tone without palpated masses or tenderness.    Assessment/Plan:  68 y.o. G3P2002 female with low back pain, fatigue and ill defined abdominal discomfort. No localizing symptoms. Exam is normal. Check baseline CBC comprehensive metabolic panel TSH. Schedule ultrasound to rule out ovarian process. If all negative then plan GI referral. Urinalysis does  show WBCs and RBCs although she gives a history of microscopic hematuria evaluated in the past. There are many squamous cells to suggest contamination and given she is not having any urinary symptoms Will await culture results and treat according to culture.    Note: This document was prepared with digital dictation and possible smart phrase technology. Any transcriptional errors that result from this process are unintentional.   [name] MD, 9:20 AM 12/30/2013

## 2014-01-02 ENCOUNTER — Other Ambulatory Visit: Payer: Self-pay | Admitting: *Deleted

## 2014-01-02 MED ORDER — SULFAMETHOXAZOLE-TMP DS 800-160 MG PO TABS: 1.0000 | ORAL_TABLET | Freq: Two times a day (BID) | ORAL | Status: DC

## 2014-01-03 LAB — URINE CULTURE: Colony Count: 45000

## 2014-01-04 ENCOUNTER — Other Ambulatory Visit: Payer: Self-pay | Admitting: Gynecology

## 2014-01-04 ENCOUNTER — Ambulatory Visit (INDEPENDENT_AMBULATORY_CARE_PROVIDER_SITE_OTHER): Payer: Medicare Other | Admitting: Gynecology

## 2014-01-04 ENCOUNTER — Ambulatory Visit (INDEPENDENT_AMBULATORY_CARE_PROVIDER_SITE_OTHER): Payer: Medicare Other

## 2014-01-04 ENCOUNTER — Encounter: Payer: Self-pay | Admitting: Gynecology

## 2014-01-04 DIAGNOSIS — D251 Intramural leiomyoma of uterus: Secondary | ICD-10-CM | POA: Diagnosis not present

## 2014-01-04 DIAGNOSIS — R142 Eructation: Secondary | ICD-10-CM

## 2014-01-04 DIAGNOSIS — R102 Pelvic and perineal pain: Secondary | ICD-10-CM

## 2014-01-04 DIAGNOSIS — D259 Leiomyoma of uterus, unspecified: Secondary | ICD-10-CM

## 2014-01-04 DIAGNOSIS — R140 Abdominal distension (gaseous): Secondary | ICD-10-CM

## 2014-01-04 DIAGNOSIS — N83339 Acquired atrophy of ovary and fallopian tube, unspecified side: Secondary | ICD-10-CM | POA: Diagnosis not present

## 2014-01-04 DIAGNOSIS — R9389 Abnormal findings on diagnostic imaging of other specified body structures: Secondary | ICD-10-CM | POA: Diagnosis not present

## 2014-01-04 DIAGNOSIS — R141 Gas pain: Secondary | ICD-10-CM

## 2014-01-04 DIAGNOSIS — N840 Polyp of corpus uteri: Secondary | ICD-10-CM | POA: Diagnosis not present

## 2014-01-04 DIAGNOSIS — R143 Flatulence: Secondary | ICD-10-CM

## 2014-01-04 IMAGING — RF DG ESOPHAGUS
14 of 23 series · 14 of 24 positions shown · non-contrast
Comparison: None.

CLINICAL DATA: Dysphagia, choking on food, gastroesophageal reflux
disease

ESOPHOGRAM/BARIUM SWALLOW
TECHNIQUE: Combined double contrast and single contrast
examination performed using effervescent crystals, thick barium
liquid, and thin barium liquid.
Fluoroscopy time:  1.3 minutes.

[Series 1: run · 1 of 1 slices shown (1 of 14)]
[im 1/1]
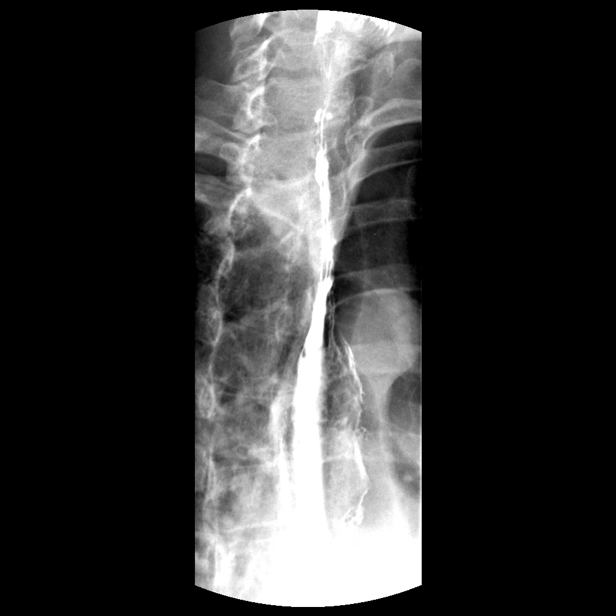

[Series 3: run · 1 of 1 slices shown (2 of 14)]
[im 1/1]
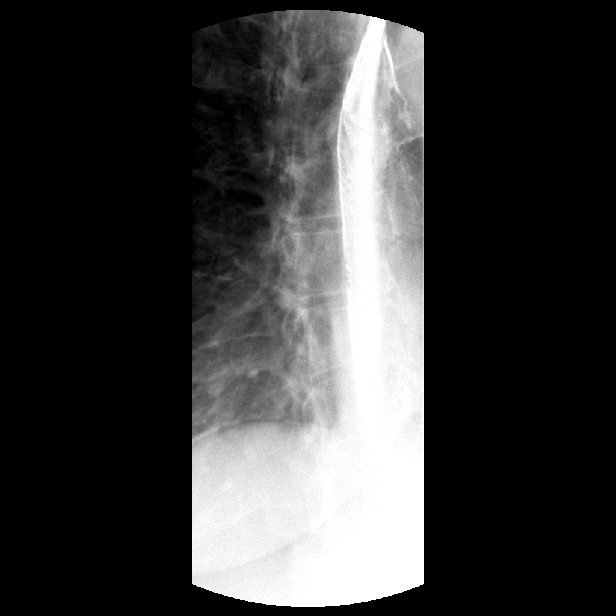

[Series 5: run · 1 of 1 slices shown (3 of 14)]
[im 1/1]
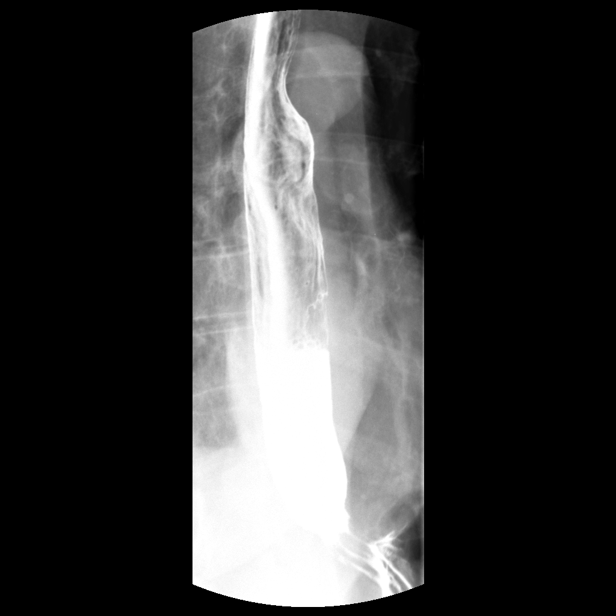

[Series 7: run · 1 of 1 slices shown (4 of 14)]
[im 1/1]
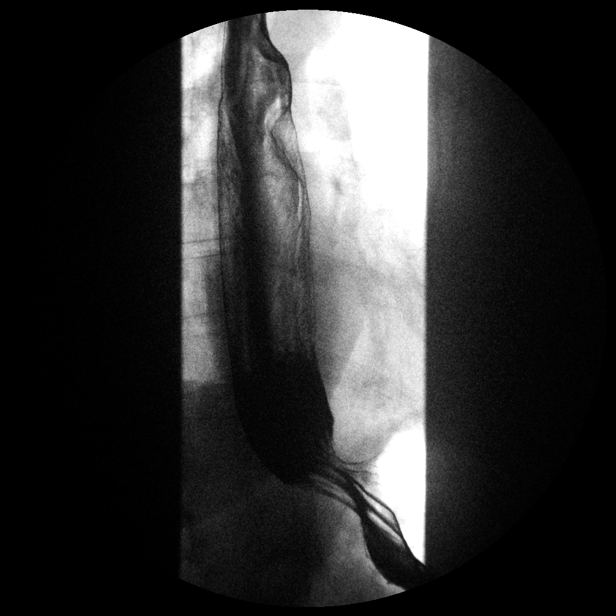

[Series 8: run · 1 of 1 slices shown (5 of 14)]
[im 1/1]
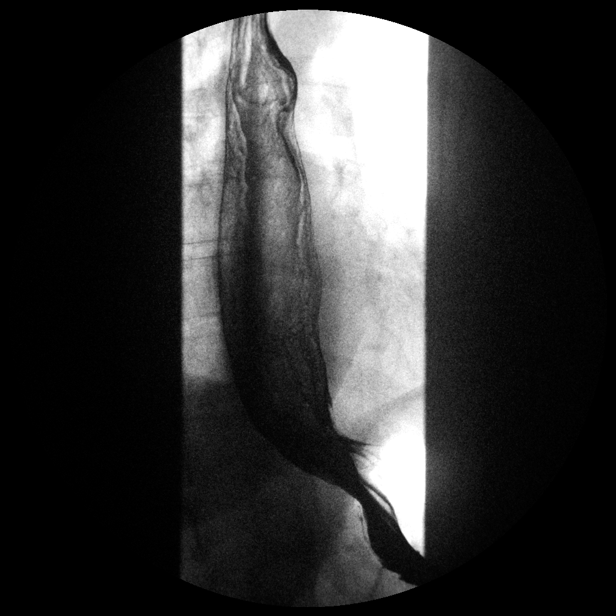

[Series 9: run · 1 of 15 slices shown (6 of 14)]
[im 15/15]
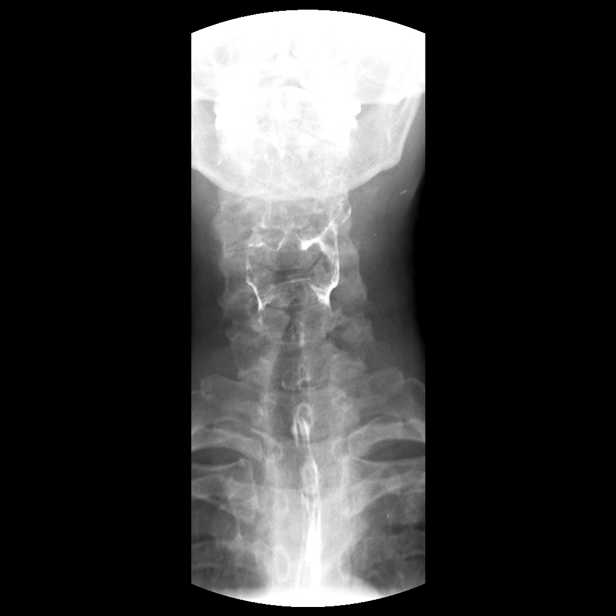

[Series 11: run · 1 of 11 slices shown (7 of 14)]
[im 1/11]
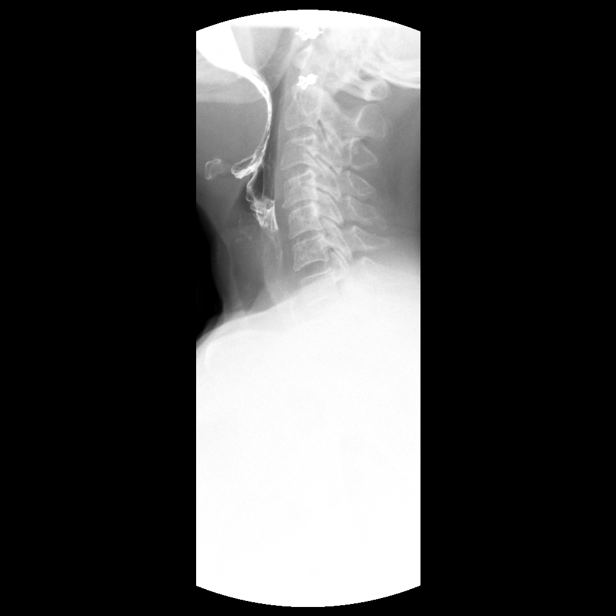

[Series 12: run · 1 of 1 slices shown (8 of 14)]
[im 1/1]
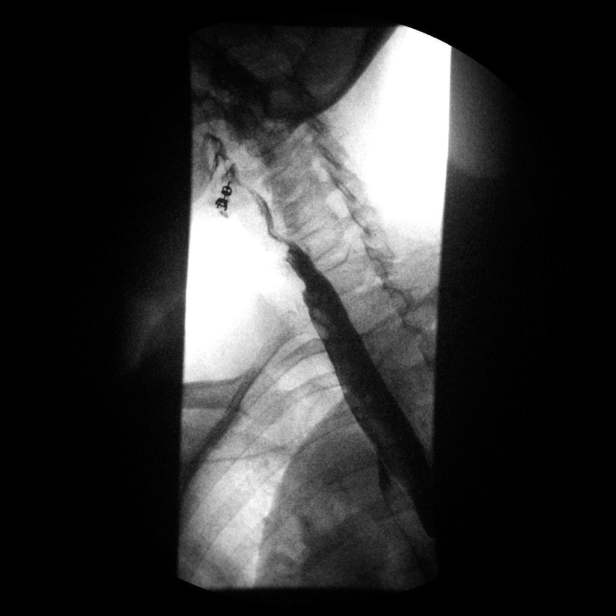

[Series 14: run · 1 of 1 slices shown (9 of 14)]
[im 1/1]
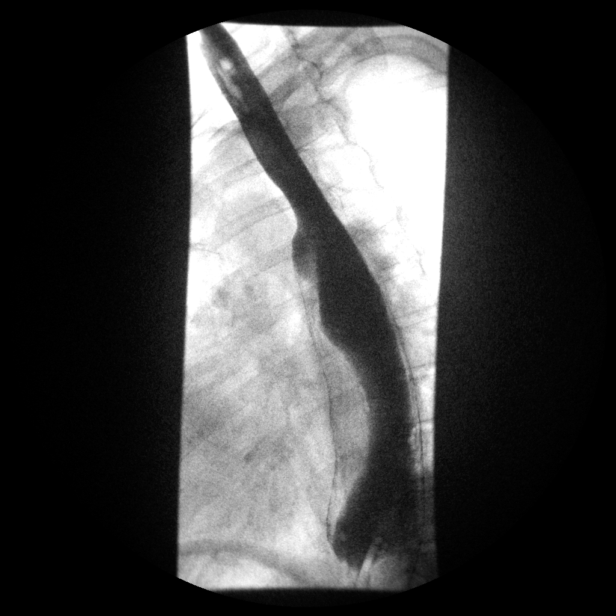

[Series 16: run · 1 of 1 slices shown (10 of 14)]
[im 1/1]
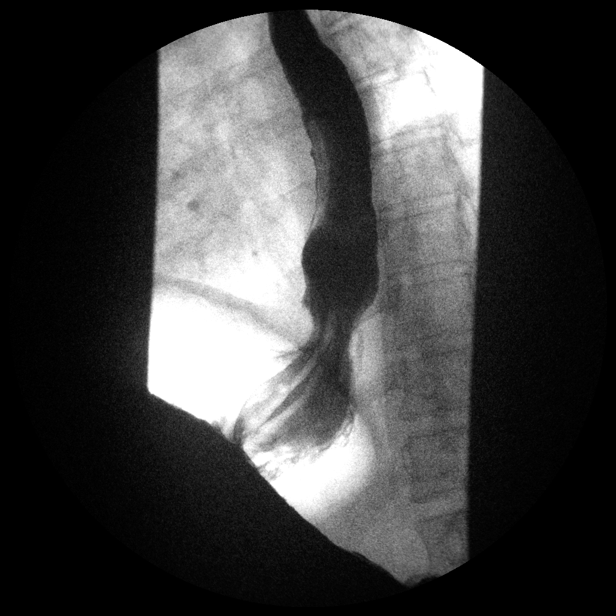

[Series 18: run · 1 of 1 slices shown (11 of 14)]
[im 1/1]
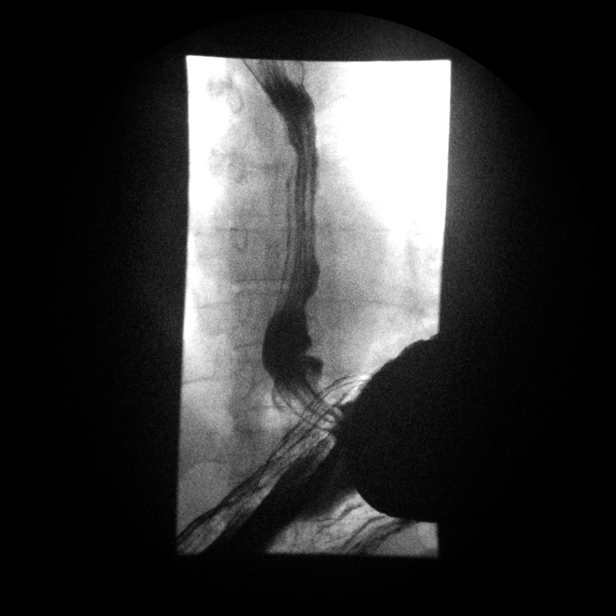

[Series 19: run · 1 of 1 slices shown (12 of 14)]
[im 1/1]
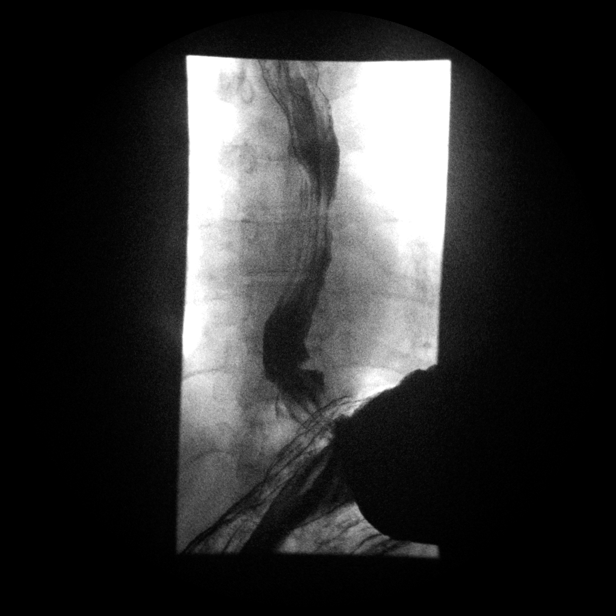

[Series 21: run · 1 of 1 slices shown (13 of 14)]
[im 1/1]
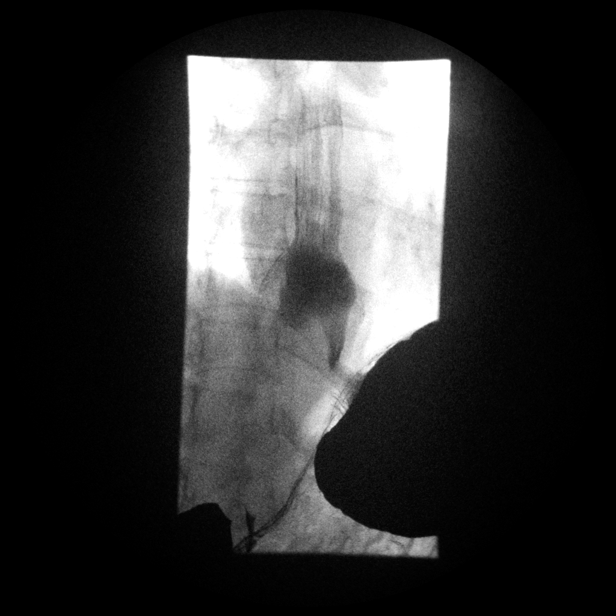

[Series 23: run · 1 of 1 slices shown (14 of 14)]
[im 1/1]
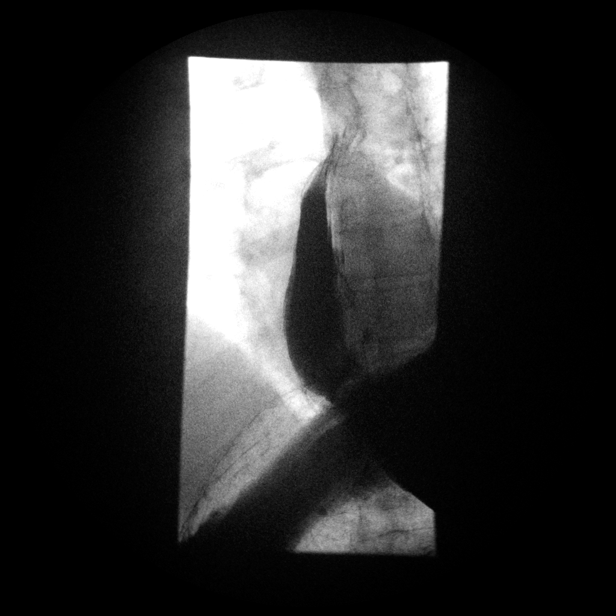

[14 of 24 positions shown; findings below may reference images not displayed]

FINDINGS: Initially a double contrast study was performed.
Coating of the mucosa is not optimal in this patient but no mucosal
abnormality is evident.  A single contrast study shows the
swallowing mechanism to be normal.  However, there is extrinsic
indentation upon the lower cervical esophagus from the left.  By
history this patient has had prior thyroid surgery and conceivably
this could represent scarring, but a left thyroid enlargement,
cyst, or mass cannot be excluded.  Thyroid ultrasound may be
helpful to assess further.  Minimal prominence of the
cricopharyngeus muscle is noted.  Esophageal peristalsis is normal.
The tiny hiatal hernia is present.  Moderate gastroesophageal
reflux is demonstrated.  A barium pill was given which passed into
the stomach without delay.
IMPRESSION: 1.  Small hiatal hernia with moderate reflux.
2.  Barium pill passes into the stomach without delay.
3.  Extrinsic impression upon the lower cervical esophagus from the
left.  Recommend thyroid ultrasound to assess for enlargement,
cyst, or solid nodule.

## 2014-01-04 NOTE — Patient Instructions (Addendum)
Follow up for ultrasound as scheduleded

## 2014-01-04 NOTE — Progress Notes (Signed)
Meagan Key [birth year] [id number]        68 y.o.  G2P2002 presents for ultrasound due to history of abdominal bloating.  History has been outlined in her 12/30/2013 note  Past medical history,surgical history, problem list, medications, allergies, family history and social history were all reviewed and documented in the EPIC chart.  Ultrasound shows uterus normal size. Small myoma 10 x 8 mm noted. Endometrial echo 7.9 mm. Positive vascular flow within the endometrial cavity noted. Focus 27 x 6 mm apparent. Right adnexa negative. Left ovary atrophic. Cul-de-sac negative.  Assessment/Plan:  68 y.o. G2P2002 abdominal bloating which I think is due to her recent gastroenteritis. Ultrasound is negative from an ovarian standpoint. Incidental endometrial prominence with probable polyp noted. Small leiomyoma also noted. Recommend followup sonohysterogram to better define the endometrial cavity. Probability for hysteroscopy D&C discussed with her. Patient agrees with the plan and will followup for the sonohysterogram.   Note: This document was prepared with digital dictation and possible smart phrase technology. Any transcriptional errors that result from this process are unintentional.   [name] MD, 10:01 AM 01/04/2014

## 2014-01-18 ENCOUNTER — Ambulatory Visit (INDEPENDENT_AMBULATORY_CARE_PROVIDER_SITE_OTHER): Payer: Medicare Other

## 2014-01-18 ENCOUNTER — Encounter: Payer: Self-pay | Admitting: Gynecology

## 2014-01-18 ENCOUNTER — Ambulatory Visit (INDEPENDENT_AMBULATORY_CARE_PROVIDER_SITE_OTHER): Payer: Medicare Other | Admitting: Gynecology

## 2014-01-18 ENCOUNTER — Other Ambulatory Visit: Payer: Self-pay | Admitting: Gynecology

## 2014-01-18 DIAGNOSIS — N840 Polyp of corpus uteri: Secondary | ICD-10-CM | POA: Diagnosis not present

## 2014-01-18 DIAGNOSIS — R9389 Abnormal findings on diagnostic imaging of other specified body structures: Secondary | ICD-10-CM

## 2014-01-18 NOTE — Progress Notes (Signed)
Meagan Key 1946/03/14 [id number]        68 y.o.  G2P2002 presents for sonohysterogram. Had regular ultrasound due to abdominal bloating complaint where a thicker endometrial echo was noted and a questionable polyp found. Patient's doing well without bleeding and notes that her abdominal bloating symptoms have resolved.  Past medical history,surgical history, problem list, medications, allergies, family history and social history were all reviewed and documented in the EPIC chart.  Ultrasound shows endometrial echo 5.8 mm.  Sonohysterogram performed, sterile technique, easy catheter introduction, good distention with 13 x 9 x 14 mm anterior wall polyp noted. Endometrial sample taken. Patient tolerated well.  Assessment/Plan:  68 y.o. [id number] with incidental finding of endometrial polyp. Options for observation versus resection reviewed and ultimately we decided to proceed with hysteroscopy, D&C, resection of the endometrial polyp. I reviewed with the patient the proposed surgery to include the intraoperative and postoperative courses as well as the recovery period. Instrumentation to include use of the hysteroscope, resectoscope and D&C portion were reviewed. The risks were discussed to include infection, prolonged antibiotics, hemorrhage necessitating transfusion and the risks of transfusion to include transfusion reaction, hepatitis, HIV, mad cow disease and other unknown entities. The risk of uterine perforation with damage to internal organs including bowel, bladder, ureters, vessels, nerves necessitating major exploratory repair surgeries and future reparative surgeries including bowel resection, ostomy formation, bladder repair and ureteral damage repair was all discussed with her. The risk of distending media absorption leading to fluid overload or metabolic complications such as coma and seizures was discussed. The patient's questions were answered to her satisfaction she is ready to proceed with  surgery we'll schedule this at her convenience.   Note: This document was prepared with digital dictation and possible smart phrase technology. Any transcriptional errors that result from this process are unintentional.   [name] MD, 1:16 PM 01/18/2014

## 2014-01-18 NOTE — Patient Instructions (Signed)
Office will contact you to arrange surgery. 

## 2014-01-19 ENCOUNTER — Telehealth: Payer: Self-pay

## 2014-01-19 MED ORDER — MISOPROSTOL 200 MCG PO TABS: ORAL_TABLET | ORAL | Status: DC

## 2014-01-19 NOTE — Telephone Encounter (Signed)
I contacted patient regarding when she would like to schedule her out patient surgery.

## 2014-01-19 NOTE — Telephone Encounter (Signed)
Patient called me back to schedule surgery.  Surg scheduled for 02/22/14 7:30am at [facility].  Patient advised regarding need for Cytotec tab VAGINALLY night before surgery and Rx was sent.  She declined pre op consult and Dr. Loetta Rough had left it up to her if she returned.

## 2014-01-23 NOTE — H&P (Signed)
Meagan Key Jul 14, 1946 [id number]   History and Physical  Chief complaint: Endometrial polyp  History of present illness: 68 y.o. G2P2002 presented initially complaining of abdominal bloating. This followed a history of gastroenteritis with prolonged diarrhea and cramping. The patient's initial exam was normal with pelvic ultrasound showing a endometrial echo of 7.9 mm with a intracavitary focus 27 x 6 mm with positive vascular flow. The ultrasound was otherwise negative. The patient subsequently had a sonohysterogram which showed a 13 x 14 x 9 cm anterior wall endometrial polyp. Endometrial sample showed benign scant endometrial fragments. The patient is admitted for hysteroscopy D&C resection of her endometrial polyp.  Past medical history,surgical history, medications, allergies, family history and social history were all reviewed and documented in the EPIC chart. ROS:  Was performed and pertinent positives and negatives are included in the history of present illness.  Exam:  Kim assistant 12/30/2013 General: well developed, well nourished female, no acute distress HEENT: normal  Lungs: clear to auscultation without wheezing, rales or rhonchi  Cardiac: regular rate without rubs, murmurs or gallops  Abdomen: soft, nontender without masses, guarding, rebound, organomegaly  Pelvic: external bus vagina: normal   Cervix: grossly normal  Uterus: normal size, midline and mobile, nontender  Adnexa: without masses or tenderness  Rectovaginal exam within normal limits     Assessment/Plan:  68 y.o. [id number] with incidental finding of endometrial polyp. Options for observation versus resection reviewed and ultimately we decided to proceed with hysteroscopy, D&C, resection of the endometrial polyp. I reviewed with the patient the proposed surgery to include the intraoperative and postoperative courses as well as the recovery period. Instrumentation to include use of the hysteroscope, resectoscope and  D&C portion were reviewed. The risks were discussed to include infection, prolonged antibiotics, hemorrhage necessitating transfusion and the risks of transfusion to include transfusion reaction, hepatitis, HIV, mad cow disease and other unknown entities. The risk of uterine perforation with damage to internal organs including bowel, bladder, ureters, vessels, nerves necessitating major exploratory repair surgeries and future reparative surgeries including bowel resection, ostomy formation, bladder repair and ureteral damage repair was all discussed with her. The risk of distending media absorption leading to fluid overload or metabolic complications such as coma and seizures was discussed. The patient's questions were answered to her satisfaction she is ready to proceed with surgery.     Note: This document was prepared with digital dictation and possible smart phrase technology. Any transcriptional errors that result from this process are unintentional.  [name] MD, 12:17 PM 01/23/2014

## 2014-02-09 ENCOUNTER — Encounter (HOSPITAL_COMMUNITY): Payer: Self-pay | Admitting: Pharmacist

## 2014-02-20 DIAGNOSIS — E559 Vitamin D deficiency, unspecified: Secondary | ICD-10-CM | POA: Diagnosis not present

## 2014-02-20 DIAGNOSIS — R82998 Other abnormal findings in urine: Secondary | ICD-10-CM | POA: Diagnosis not present

## 2014-02-20 DIAGNOSIS — E049 Nontoxic goiter, unspecified: Secondary | ICD-10-CM | POA: Diagnosis not present

## 2014-02-20 DIAGNOSIS — I10 Essential (primary) hypertension: Secondary | ICD-10-CM | POA: Diagnosis not present

## 2014-02-20 DIAGNOSIS — E785 Hyperlipidemia, unspecified: Secondary | ICD-10-CM | POA: Diagnosis not present

## 2014-02-20 NOTE — Patient Instructions (Addendum)
   Your procedure is scheduled on: Wednesday, May 6  Enter through the Main Entrance of [facility] at:  6 AM Pick up the phone at the desk and dial [phone] and inform us of your arrival.  Please call this number if you have any problems the morning of surgery: [phone]  Remember: Do not eat or  Drink after midnight: Tuesday Take these medicines the morning of surgery with a SIP OF WATER:  Amlodipine,  nexium.  Bring albuterol inhaler with you on your day of surgery.  Do not wear jewelry, make-up, or FINGER nail polish No metal in your hair or on your body. Do not wear lotions, powders, perfumes.  You may wear deodorant.  Do not bring valuables to the hospital. Contacts, dentures or bridgework may not be worn into surgery.   Patients discharged on the day of surgery will not be allowed to drive home.  Home with husband Meagan Key cell [phone].

## 2014-02-21 ENCOUNTER — Encounter (HOSPITAL_COMMUNITY): Payer: Self-pay

## 2014-02-21 ENCOUNTER — Encounter (HOSPITAL_COMMUNITY)
Admission: RE | Admit: 2014-02-21 | Discharge: 2014-02-21 | Disposition: A | Discharge status: Home / Self Care | Payer: Medicare Other | Source: Ambulatory Visit | Attending: Gynecology | Admitting: Gynecology

## 2014-02-21 DIAGNOSIS — Z87891 Personal history of nicotine dependence: Secondary | ICD-10-CM | POA: Diagnosis not present

## 2014-02-21 DIAGNOSIS — J45909 Unspecified asthma, uncomplicated: Secondary | ICD-10-CM | POA: Diagnosis not present

## 2014-02-21 DIAGNOSIS — K219 Gastro-esophageal reflux disease without esophagitis: Secondary | ICD-10-CM | POA: Diagnosis not present

## 2014-02-21 DIAGNOSIS — G4730 Sleep apnea, unspecified: Secondary | ICD-10-CM | POA: Diagnosis not present

## 2014-02-21 DIAGNOSIS — N840 Polyp of corpus uteri: Secondary | ICD-10-CM | POA: Diagnosis not present

## 2014-02-21 DIAGNOSIS — I10 Essential (primary) hypertension: Secondary | ICD-10-CM | POA: Diagnosis not present

## 2014-02-21 HISTORY — DX: Unspecified osteoarthritis, unspecified site: M19.90

## 2014-02-21 MED ORDER — DEXTROSE 5 % IV SOLN
2.0000 g | INTRAVENOUS | Status: AC
  Administered 2014-02-22: 2 g via INTRAVENOUS
  Filled 2014-02-21: qty 2

## 2014-02-22 ENCOUNTER — Encounter (HOSPITAL_COMMUNITY): Payer: Medicare Other | Admitting: Anesthesiology

## 2014-02-22 ENCOUNTER — Ambulatory Visit (HOSPITAL_COMMUNITY)
Admission: RE | Admit: 2014-02-22 | Discharge: 2014-02-22 | Disposition: A | Discharge status: Home / Self Care | Payer: Medicare Other | Source: Ambulatory Visit | Attending: Gynecology | Admitting: Gynecology

## 2014-02-22 ENCOUNTER — Encounter (HOSPITAL_COMMUNITY)
Admission: RE | Disposition: A | Discharge status: Home / Self Care | Payer: Self-pay | Source: Ambulatory Visit | Attending: Gynecology

## 2014-02-22 ENCOUNTER — Encounter (HOSPITAL_COMMUNITY): Payer: Self-pay | Admitting: *Deleted

## 2014-02-22 ENCOUNTER — Ambulatory Visit (HOSPITAL_COMMUNITY): Payer: Medicare Other | Admitting: Anesthesiology

## 2014-02-22 DIAGNOSIS — J45909 Unspecified asthma, uncomplicated: Secondary | ICD-10-CM | POA: Insufficient documentation

## 2014-02-22 DIAGNOSIS — G4730 Sleep apnea, unspecified: Secondary | ICD-10-CM | POA: Diagnosis not present

## 2014-02-22 DIAGNOSIS — I10 Essential (primary) hypertension: Secondary | ICD-10-CM | POA: Insufficient documentation

## 2014-02-22 DIAGNOSIS — N840 Polyp of corpus uteri: Secondary | ICD-10-CM | POA: Insufficient documentation

## 2014-02-22 DIAGNOSIS — Z87891 Personal history of nicotine dependence: Secondary | ICD-10-CM | POA: Insufficient documentation

## 2014-02-22 DIAGNOSIS — K219 Gastro-esophageal reflux disease without esophagitis: Secondary | ICD-10-CM | POA: Diagnosis not present

## 2014-02-22 HISTORY — PX: DILATATION & CURETTAGE/HYSTEROSCOPY WITH TRUECLEAR: SHX6353

## 2014-02-22 SURGERY — DILATATION & CURETTAGE/HYSTEROSCOPY WITH TRUCLEAR
Anesthesia: General

## 2014-02-22 MED ORDER — MEPERIDINE HCL 25 MG/ML IJ SOLN: 6.2500 mg | INTRAMUSCULAR | Status: DC | PRN

## 2014-02-22 MED ORDER — SODIUM CHLORIDE 0.9 % IJ SOLN
INTRAMUSCULAR | Status: AC
Start: 2014-02-22 — End: 2014-02-22
  Filled 2014-02-22: qty 10

## 2014-02-22 MED ORDER — PHENYLEPHRINE 40 MCG/ML (10ML) SYRINGE FOR IV PUSH (FOR BLOOD PRESSURE SUPPORT)
PREFILLED_SYRINGE | INTRAVENOUS | Status: AC
  Filled 2014-02-22: qty 5

## 2014-02-22 MED ORDER — LIDOCAINE HCL (CARDIAC) 20 MG/ML IV SOLN
INTRAVENOUS | Status: AC
  Filled 2014-02-22: qty 5

## 2014-02-22 MED ORDER — FENTANYL CITRATE 0.05 MG/ML IJ SOLN
INTRAMUSCULAR | Status: AC
  Filled 2014-02-22: qty 2

## 2014-02-22 MED ORDER — LIDOCAINE HCL (CARDIAC) 20 MG/ML IV SOLN
INTRAVENOUS | Status: DC | PRN
  Administered 2014-02-22: 60 mg via INTRAVENOUS

## 2014-02-22 MED ORDER — LACTATED RINGERS IV SOLN
INTRAVENOUS | Status: DC
Start: 2014-02-22 — End: 2014-02-22
  Administered 2014-02-22 (×2): via INTRAVENOUS

## 2014-02-22 MED ORDER — DIPHENHYDRAMINE HCL 50 MG/ML IJ SOLN
INTRAMUSCULAR | Status: DC | PRN
  Administered 2014-02-22 (×2): 12.5 mg via INTRAVENOUS

## 2014-02-22 MED ORDER — KETOROLAC TROMETHAMINE 30 MG/ML IJ SOLN
INTRAMUSCULAR | Status: DC | PRN
  Administered 2014-02-22: 15 mg via INTRAVENOUS

## 2014-02-22 MED ORDER — SODIUM CHLORIDE 0.9 % IR SOLN
Status: DC | PRN
  Administered 2014-02-22: 3000 mL

## 2014-02-22 MED ORDER — FENTANYL CITRATE 0.05 MG/ML IJ SOLN: 25.0000 ug | INTRAMUSCULAR | Status: DC | PRN

## 2014-02-22 MED ORDER — KETOROLAC TROMETHAMINE 15 MG/ML IJ SOLN
15.0000 mg | Freq: Once | INTRAMUSCULAR | Status: DC | PRN
  Filled 2014-02-22: qty 1

## 2014-02-22 MED ORDER — METOCLOPRAMIDE HCL 5 MG/ML IJ SOLN: 10.0000 mg | Freq: Once | INTRAMUSCULAR | Status: DC | PRN

## 2014-02-22 MED ORDER — PHENYLEPHRINE HCL 10 MG/ML IJ SOLN
INTRAMUSCULAR | Status: DC | PRN
  Administered 2014-02-22: 80 ug via INTRAVENOUS
  Administered 2014-02-22: 40 ug via INTRAVENOUS

## 2014-02-22 MED ORDER — PROPOFOL 10 MG/ML IV BOLUS
INTRAVENOUS | Status: DC | PRN
  Administered 2014-02-22: 180 mg via INTRAVENOUS

## 2014-02-22 MED ORDER — ONDANSETRON HCL 4 MG/2ML IJ SOLN
INTRAMUSCULAR | Status: AC
  Filled 2014-02-22: qty 2

## 2014-02-22 MED ORDER — ONDANSETRON HCL 4 MG/2ML IJ SOLN
INTRAMUSCULAR | Status: DC | PRN
  Administered 2014-02-22: 4 mg via INTRAVENOUS

## 2014-02-22 MED ORDER — PROPOFOL 10 MG/ML IV EMUL
INTRAVENOUS | Status: AC
  Filled 2014-02-22: qty 20

## 2014-02-22 MED ORDER — ALBUTEROL SULFATE HFA 108 (90 BASE) MCG/ACT IN AERS
INHALATION_SPRAY | RESPIRATORY_TRACT | Status: DC | PRN
  Administered 2014-02-22: 2 via RESPIRATORY_TRACT

## 2014-02-22 MED ORDER — LIDOCAINE HCL 1 % IJ SOLN
INTRAMUSCULAR | Status: DC | PRN
  Administered 2014-02-22: 10 mL

## 2014-02-22 MED ORDER — DIPHENHYDRAMINE HCL 50 MG/ML IJ SOLN
INTRAMUSCULAR | Status: AC
  Filled 2014-02-22: qty 1

## 2014-02-22 MED ORDER — FENTANYL CITRATE 0.05 MG/ML IJ SOLN
INTRAMUSCULAR | Status: DC | PRN
  Administered 2014-02-22: 50 ug via INTRAVENOUS

## 2014-02-22 MED ORDER — LIDOCAINE HCL 1 % IJ SOLN
INTRAMUSCULAR | Status: AC
  Filled 2014-02-22: qty 20

## 2014-02-22 MED ORDER — OXYCODONE-ACETAMINOPHEN 5-325 MG PO TABS: 1.0000 | ORAL_TABLET | ORAL | Status: DC | PRN

## 2014-02-22 SURGICAL SUPPLY — 20 items
BLADE INCISOR TRUC PLUS 2.9 (ABLATOR) IMPLANT
CANISTERS HI-FLOW 3000CC (CANNISTER) IMPLANT
CATH ROBINSON RED A/P 16FR (CATHETERS) ×3 IMPLANT
CLOTH BEACON ORANGE TIMEOUT ST (SAFETY) ×3 IMPLANT
CONTAINER PREFILL 10% NBF 60ML (FORM) ×6 IMPLANT
DRAPE HYSTEROSCOPY (DRAPE) ×3 IMPLANT
DRSG TELFA 3X8 NADH (GAUZE/BANDAGES/DRESSINGS) ×3 IMPLANT
GLOVE BIO SURGEON STRL SZ7.5 (GLOVE) ×3 IMPLANT
GOWN STRL REUS W/TWL LRG LVL3 (GOWN DISPOSABLE) ×6 IMPLANT
INCISOR TRUC PLUS BLADE 2.9 (ABLATOR) ×3
KIT HYSTEROSCOPY TRUCLEAR (ABLATOR) IMPLANT
MORCELLATOR RECIP TRUCLEAR 4.0 (ABLATOR) IMPLANT
NDL SPNL 22GX3.5 QUINCKE BK (NEEDLE) IMPLANT
NEEDLE SPNL 22GX3.5 QUINCKE BK (NEEDLE) ×3 IMPLANT
PACK VAGINAL MINOR WOMEN LF (CUSTOM PROCEDURE TRAY) ×3 IMPLANT
PAD DRESSING TELFA 3X8 NADH (GAUZE/BANDAGES/DRESSINGS) ×1 IMPLANT
PAD OB MATERNITY 4.3X12.25 (PERSONAL CARE ITEMS) ×3 IMPLANT
SYR CONTROL 10ML LL (SYRINGE) ×3 IMPLANT
TOWEL OR 17X24 6PK STRL BLUE (TOWEL DISPOSABLE) ×6 IMPLANT
WATER STERILE IRR 1000ML POUR (IV SOLUTION) ×3 IMPLANT

## 2014-02-22 NOTE — Discharge Instructions (Signed)
° °  Postoperative Instructions Hysteroscopy D & C ° °Dr. Soliyana Mcchristian and the nursing staff have discussed postoperative instructions with you.  If you have any questions please ask them before you leave the hospital, or call Dr Zailah Zagami’s office at [phone].   ° °We would like to emphasize the following instructions: ° ° °? Call the office to make your follow-up appointment as recommended by Dr Keldon Lassen (usually 1-2 weeks). ° °? You were given a prescription, or one was ordered for you at the pharmacy you designated.  Get that prescription filled and take the medication according to instructions. ° °? You may eat a regular diet, but slowly until you start having bowel movements. ° °? Drink plenty of water daily. ° °? Nothing in the vagina (intercourse, douching, objects of any kind) for two weeks.  When reinitiating intercourse, if it is uncomfortable, stop and make an appointment with Dr Innocence Schlotzhauer to be evaluated. ° °? No driving for one to two days until the effects of anesthesia has worn off.  No traveling out of town for several days. ° °? You may shower, but no baths for one week.  Walking up and down stairs is ok.  No heavy lifting, prolonged standing, repeated bending or any “working out” until your post op check. ° °? Rest frequently, listen to your body and do not push yourself and overdo it. ° °? Call if: ° °o Your pain medication does not seem strong enough. °o Worsening pain or abdominal bloating °o Persistent nausea or vomiting °o Difficulty with urination or bowel movements. °o Temperature of 101 degrees or higher. °o Heavy vaginal bleeding.  If your period is due, you may use tampons. °o You have any questions or concerns ° ° ° °

## 2014-02-22 NOTE — Transfer of Care (Signed)
Immediate Anesthesia Transfer of Care Note  Patient: Meagan Key  Procedure(s) Performed: Procedure(s): DILATATION & CURETTAGE/HYSTEROSCOPY WITH TRUCLEAR (N/A)  Patient Location: PACU  Anesthesia Type:General  Level of Consciousness: awake, oriented, sedated and patient cooperative  Airway & Oxygen Therapy: Patient Spontanous Breathing and Patient connected to nasal cannula oxygen  Post-op Assessment: Report given to PACU RN and Post -op Vital signs reviewed and stable  Post vital signs: Reviewed and stable  Complications: No apparent anesthesia complications

## 2014-02-22 NOTE — Op Note (Signed)
Meagan Key December 25, 1945 [id number]   Post Operative Note   Date of surgery:  02/22/2014  Pre Op Dx:  Endometrial polyp  Post Op Dx:  Endometrial polyp  Procedure:  Hysteroscopy, Truclear resection endometrial polyp x2 resection, D&C  Surgeon:  Belinda Block Fontaine  Anesthesia:  General  EBL:  Minimal  Distended media discrepancy:  60 cc saline  Complications:  None  Specimen:  #1 endometrial polyp #2 endometrial curetting to pathology  Findings: EUA:  External BUS vagina with atrophic changes. Cervix normal with atrophic changes. Uterus normal size midline mobile. Adnexa without masses   Hysteroscopy:  2 endometrial polyps left lower lateral endometrial cavity, resected in their entirety. Endometrial cavity otherwise atrophic without abnormalities. Fundus, right and left tubal ostia, anterior/posterior uterine surfaces, lower uterine segment, endocervical canal all visualized  Procedure:  The patient was taken to the operating room, underwent general anesthesia, was placed in the low dorsal lithotomy position, received a vaginal/perineal operation with Betadine solution and the bladder was emptied with an in and out Foley catheterization per nursing personnel. The time out was performed by the surgical team. The patient was draped in the usual fashion an EUA was performed. The cervix was visualized with a speculum, anterior lip grasped with a single-tooth tenaculum and a paracervical block using 1% lidocaine was placed, 10 cc total. The cervix was gently and gradually dilated to admit the Truclear 5 mm hysteroscope and hysteroscopy was performed with findings noted above. Using the Truclear Incisor blade the 2 polyps were resected in their entirety to the level of the surrounding endometrium. A gentle sharp curettage was also performed and both specimens were sent separately to pathology. Repeat hysteroscopy showed an empty cavity with good distention and no evidence of perforation. The  instruments were removed and adequate hemostasis was visualized at the tenaculum site and external cervical os. The speculum was removed, the patient placed in a supine position, awakened without difficulty having received intraoperative Toradol and was taken to recovery room in good condition.   Note: This document was prepared with digital dictation and possible smart phrase technology. Any transcriptional errors that result from this process are unintentional.  [name] MD, 8:38 AM 02/22/2014

## 2014-02-22 NOTE — Anesthesia Preprocedure Evaluation (Addendum)
Anesthesia Evaluation  Patient identified by MRN, date of birth, ID band Patient awake    Reviewed: Allergy & Precautions, H&P , NPO status , Patient's Chart, lab work & pertinent test results, reviewed documented beta blocker date and time   History of Anesthesia Complications Negative for: history of anesthetic complications  Airway Mallampati: III TM Distance: >3 FB Neck ROM: full    Dental  (+) Teeth Intact   Pulmonary asthma (steroid inhaler late feb for exacerbation, usually only needs rescue with colds, smoke exposure) , sleep apnea (diagnosed by sleep study, no CPAP) , former smoker (quit 1985),  breath sounds clear to auscultation  Pulmonary exam normal       Cardiovascular Exercise Tolerance: Good hypertension, On Home Beta Blockers and On Medications Rhythm:regular Rate:Normal     Neuro/Psych negative neurological ROS  negative psych ROS   GI/Hepatic Neg liver ROS, GERD-  Medicated,  Endo/Other  negative endocrine ROS  Renal/GU negative Renal ROS  Female GU complaint     Musculoskeletal   Abdominal   Peds  Hematology negative hematology ROS (+)   Anesthesia Other Findings   Reproductive/Obstetrics negative OB ROS                          Anesthesia Physical Anesthesia Plan  ASA: II  Anesthesia Plan: General LMA   Post-op Pain Management:    Induction:   Airway Management Planned:   Additional Equipment:   Intra-op Plan:   Post-operative Plan:   Informed Consent: I have reviewed the patients History and Physical, chart, labs and discussed the procedure including the risks, benefits and alternatives for the proposed anesthesia with the patient or authorized representative who has indicated his/her understanding and acceptance.   Dental Advisory Given  Plan Discussed with: CRNA and Surgeon  Anesthesia Plan Comments:         Anesthesia Quick Evaluation

## 2014-02-22 NOTE — H&P (Signed)
  The patient was examined.  I reviewed the proposed surgery and consent form with the patient.  The dictated history and physical is current and accurate and all questions were answered. The patient is ready to proceed with surgery and has a realistic understanding and expectation for the outcome.   [name] MD, 7:00 AM 02/22/2014

## 2014-02-23 ENCOUNTER — Encounter (HOSPITAL_COMMUNITY): Payer: Self-pay | Admitting: Gynecology

## 2014-02-23 NOTE — Anesthesia Postprocedure Evaluation (Signed)
  Anesthesia Post-op Note  Patient: Meagan Key  Procedure(s) Performed: Procedure(s): DILATATION & CURETTAGE/HYSTEROSCOPY WITH TRUCLEAR (N/A)  Last Vitals:  Filed Vitals:   02/22/14 1010  BP: 124/71  Pulse: 70  Temp: 36.6 C  Resp: 16   Patient is awake and responsive. Pain and nausea are reasonably well controlled. Vital signs are stable and clinically acceptable. Oxygen saturation is clinically acceptable. There are no apparent anesthetic complications at this time. Patient is ready for discharge.

## 2014-02-27 DIAGNOSIS — K219 Gastro-esophageal reflux disease without esophagitis: Secondary | ICD-10-CM | POA: Diagnosis not present

## 2014-02-27 DIAGNOSIS — J45909 Unspecified asthma, uncomplicated: Secondary | ICD-10-CM | POA: Diagnosis not present

## 2014-02-27 DIAGNOSIS — Z1331 Encounter for screening for depression: Secondary | ICD-10-CM | POA: Diagnosis not present

## 2014-02-27 DIAGNOSIS — E785 Hyperlipidemia, unspecified: Secondary | ICD-10-CM | POA: Diagnosis not present

## 2014-02-27 DIAGNOSIS — R1310 Dysphagia, unspecified: Secondary | ICD-10-CM | POA: Diagnosis not present

## 2014-02-27 DIAGNOSIS — R82998 Other abnormal findings in urine: Secondary | ICD-10-CM | POA: Diagnosis not present

## 2014-02-27 DIAGNOSIS — Z0000 Encounter for general adult medical examination without abnormal findings: Secondary | ICD-10-CM | POA: Diagnosis not present

## 2014-02-27 DIAGNOSIS — E559 Vitamin D deficiency, unspecified: Secondary | ICD-10-CM | POA: Diagnosis not present

## 2014-02-27 DIAGNOSIS — Z23 Encounter for immunization: Secondary | ICD-10-CM | POA: Diagnosis not present

## 2014-02-27 DIAGNOSIS — E049 Nontoxic goiter, unspecified: Secondary | ICD-10-CM | POA: Diagnosis not present

## 2014-02-27 DIAGNOSIS — I10 Essential (primary) hypertension: Secondary | ICD-10-CM | POA: Diagnosis not present

## 2014-03-02 ENCOUNTER — Ambulatory Visit (INDEPENDENT_AMBULATORY_CARE_PROVIDER_SITE_OTHER): Payer: Medicare Other | Admitting: Gynecology

## 2014-03-02 ENCOUNTER — Encounter: Payer: Self-pay | Admitting: Gynecology

## 2014-03-02 DIAGNOSIS — Z9889 Other specified postprocedural states: Secondary | ICD-10-CM

## 2014-03-02 NOTE — Patient Instructions (Signed)
Followup November  2015 for annual exam.

## 2014-03-02 NOTE — Progress Notes (Signed)
Meagan Key [birth year] [id number]        68 y.o.  G2P2002 presents for her postoperative visit status post hysteroscopy D&C resection of endometrial polyp 02/22/2014. Not having any issues as far as pain or bleeding.  Past medical history,surgical history, problem list, medications, allergies, family history and social history were all reviewed and documented in the EPIC chart.  Directed ROS with pertinent positives and negatives documented in the history of present illness/assessment and plan.  Exam: Kim assistant General appearance  Normal Abdomen soft nontender without masses guarding or rebound Pelvic external BUS vagina with atrophic changes. Cervix normal. Uterus normal sized mobile nontender. Adnexa without masses or tenderness.  Assessment/Plan:  68 y.o. [id number] with normal operative checkup status post hysteroscopy D&C. Review pathology findings which showed a benign endometrial polyp. Patient will followup in November 2015 when she is due for her annual exam, sooner as needed.   Note: This document was prepared with digital dictation and possible smart phrase technology. Any transcriptional errors that result from this process are unintentional.   [name] MD, 11:38 AM 03/02/2014

## 2014-04-04 DIAGNOSIS — Z1212 Encounter for screening for malignant neoplasm of rectum: Secondary | ICD-10-CM | POA: Diagnosis not present

## 2014-07-17 DIAGNOSIS — H5710 Ocular pain, unspecified eye: Secondary | ICD-10-CM | POA: Diagnosis not present

## 2014-08-21 ENCOUNTER — Encounter: Payer: Self-pay | Admitting: Gynecology

## 2014-08-28 DIAGNOSIS — E785 Hyperlipidemia, unspecified: Secondary | ICD-10-CM | POA: Diagnosis not present

## 2014-09-08 ENCOUNTER — Ambulatory Visit (INDEPENDENT_AMBULATORY_CARE_PROVIDER_SITE_OTHER): Payer: Medicare Other | Admitting: Gynecology

## 2014-09-08 ENCOUNTER — Encounter: Payer: Self-pay | Admitting: Gynecology

## 2014-09-08 VITALS — BP 130/80 | Ht 62.0 in | Wt 148.0 lb

## 2014-09-08 DIAGNOSIS — N809 Endometriosis, unspecified: Secondary | ICD-10-CM

## 2014-09-08 DIAGNOSIS — M8580 Other specified disorders of bone density and structure, unspecified site: Secondary | ICD-10-CM

## 2014-09-08 DIAGNOSIS — N952 Postmenopausal atrophic vaginitis: Secondary | ICD-10-CM

## 2014-09-08 NOTE — Patient Instructions (Signed)
You may obtain a copy of any labs that were done today by logging onto MyChart as outlined in the instructions provided with your AVS (after visit summary). The office will not call with normal lab results but certainly if there are any significant abnormalities then we will contact you.   Health Maintenance, Female A healthy lifestyle and preventative care can promote health and wellness.  Maintain regular health, dental, and eye exams.  Eat a healthy diet. Foods like vegetables, fruits, whole grains, low-fat dairy products, and lean protein foods contain the nutrients you need without too many calories. Decrease your intake of foods high in solid fats, added sugars, and salt. Get information about a proper diet from your caregiver, if necessary.  Regular physical exercise is one of the most important things you can do for your health. Most adults should get at least 150 minutes of moderate-intensity exercise (any activity that increases your heart rate and causes you to sweat) each week. In addition, most adults need muscle-strengthening exercises on 2 or more days a week.   Maintain a healthy weight. The body mass index (BMI) is a screening tool to identify possible weight problems. It provides an estimate of body fat based on height and weight. Your caregiver can help determine your BMI, and can help you achieve or maintain a healthy weight. For adults 20 years and older:  A BMI below 18.5 is considered underweight.  A BMI of 18.5 to 24.9 is normal.  A BMI of 25 to 29.9 is considered overweight.  A BMI of 30 and above is considered obese.  Maintain normal blood lipids and cholesterol by exercising and minimizing your intake of saturated fat. Eat a balanced diet with plenty of fruits and vegetables. Blood tests for lipids and cholesterol should begin at age 60 and be repeated every 5 years. If your lipid or cholesterol levels are high, you are over 50, or you are a high risk for heart  disease, you may need your cholesterol levels checked more frequently.Ongoing high lipid and cholesterol levels should be treated with medicines if diet and exercise are not effective.  If you smoke, find out from your caregiver how to quit. If you do not use tobacco, do not start.  Lung cancer screening is recommended for adults aged 54 80 years who are at high risk for developing lung cancer because of a history of smoking. Yearly low-dose computed tomography (CT) is recommended for people who have at least a 30-pack-year history of smoking and are a current smoker or have quit within the past 15 years. A pack year of smoking is smoking an average of 1 pack of cigarettes a day for 1 year (for example: 1 pack a day for 30 years or 2 packs a day for 15 years). Yearly screening should continue until the smoker has stopped smoking for at least 15 years. Yearly screening should also be stopped for people who develop a health problem that would prevent them from having lung cancer treatment.  If you are pregnant, do not drink alcohol. If you are breastfeeding, be very cautious about drinking alcohol. If you are not pregnant and choose to drink alcohol, do not exceed 1 drink per day. One drink is considered to be 12 ounces (355 mL) of beer, 5 ounces (148 mL) of wine, or 1.5 ounces (44 mL) of liquor.  Avoid use of street drugs. Do not share needles with anyone. Ask for help if you need support or instructions about stopping  the use of drugs.  High blood pressure causes heart disease and increases the risk of stroke. Blood pressure should be checked at least every 1 to 2 years. Ongoing high blood pressure should be treated with medicines, if weight loss and exercise are not effective.  If you are 59 to 70 years old, ask your caregiver if you should take aspirin to prevent strokes.  Diabetes screening involves taking a blood sample to check your fasting blood sugar level. This should be done once every 3  years, after age 91, if you are within normal weight and without risk factors for diabetes. Testing should be considered at a younger age or be carried out more frequently if you are overweight and have at least 1 risk factor for diabetes.  Breast cancer screening is essential preventative care for women. You should practice "breast self-awareness." This means understanding the normal appearance and feel of your breasts and may include breast self-examination. Any changes detected, no matter how small, should be reported to a caregiver. Women in their 66s and 30s should have a clinical breast exam (CBE) by a caregiver as part of a regular health exam every 1 to 3 years. After age 101, women should have a CBE every year. Starting at age 100, women should consider having a mammogram (breast X-ray) every year. Women who have a family history of breast cancer should talk to their caregiver about genetic screening. Women at a high risk of breast cancer should talk to their caregiver about having an MRI and a mammogram every year.  Breast cancer gene (BRCA)-related cancer risk assessment is recommended for women who have family members with BRCA-related cancers. BRCA-related cancers include breast, ovarian, tubal, and peritoneal cancers. Having family members with these cancers may be associated with an increased risk for harmful changes (mutations) in the breast cancer genes BRCA1 and BRCA2. Results of the assessment will determine the need for genetic counseling and BRCA1 and BRCA2 testing.  The Pap test is a screening test for cervical cancer. Women should have a Pap test starting at age 57. Between ages 25 and 35, Pap tests should be repeated every 2 years. Beginning at age 37, you should have a Pap test every 3 years as long as the past 3 Pap tests have been normal. If you had a hysterectomy for a problem that was not cancer or a condition that could lead to cancer, then you no longer need Pap tests. If you are  between ages 50 and 76, and you have had normal Pap tests going back 10 years, you no longer need Pap tests. If you have had past treatment for cervical cancer or a condition that could lead to cancer, you need Pap tests and screening for cancer for at least 20 years after your treatment. If Pap tests have been discontinued, risk factors (such as a new sexual partner) need to be reassessed to determine if screening should be resumed. Some women have medical problems that increase the chance of getting cervical cancer. In these cases, your caregiver may recommend more frequent screening and Pap tests.  The human papillomavirus (HPV) test is an additional test that may be used for cervical cancer screening. The HPV test looks for the virus that can cause the cell changes on the cervix. The cells collected during the Pap test can be tested for HPV. The HPV test could be used to screen women aged 44 years and older, and should be used in women of any age  who have unclear Pap test results. After the age of 14, women should have HPV testing at the same frequency as a Pap test.  Colorectal cancer can be detected and often prevented. Most routine colorectal cancer screening begins at the age of 24 and continues through age 50. However, your caregiver may recommend screening at an earlier age if you have risk factors for colon cancer. On a yearly basis, your caregiver may provide home test kits to check for hidden blood in the stool. Use of a small camera at the end of a tube, to directly examine the colon (sigmoidoscopy or colonoscopy), can detect the earliest forms of colorectal cancer. Talk to your caregiver about this at age 31, when routine screening begins. Direct examination of the colon should be repeated every 5 to 10 years through age 46, unless early forms of pre-cancerous polyps or small growths are found.  Hepatitis C blood testing is recommended for all people born from [birth year] through 1967 and any  individual with known risks for hepatitis C.  Practice safe sex. Use condoms and avoid high-risk sexual practices to reduce the spread of sexually transmitted infections (STIs). Sexually active women aged 6 and younger should be checked for Chlamydia, which is a common sexually transmitted infection. Older women with new or multiple partners should also be tested for Chlamydia. Testing for other STIs is recommended if you are sexually active and at increased risk.  Osteoporosis is a disease in which the bones lose minerals and strength with aging. This can result in serious bone fractures. The risk of osteoporosis can be identified using a bone density scan. Women ages 75 and over and women at risk for fractures or osteoporosis should discuss screening with their caregivers. Ask your caregiver whether you should be taking a calcium supplement or vitamin D to reduce the rate of osteoporosis.  Menopause can be associated with physical symptoms and risks. Hormone replacement therapy is available to decrease symptoms and risks. You should talk to your caregiver about whether hormone replacement therapy is right for you.  Use sunscreen. Apply sunscreen liberally and repeatedly throughout the day. You should seek shade when your shadow is shorter than you. Protect yourself by wearing long sleeves, pants, a wide-brimmed hat, and sunglasses year round, whenever you are outdoors.  Notify your caregiver of new moles or changes in moles, especially if there is a change in shape or color. Also notify your caregiver if a mole is larger than the size of a pencil eraser.  Stay current with your immunizations. Document Released: 04/21/2011 Document Revised: 01/31/2013 Document Reviewed: 04/21/2011 [facility] Patient Information 2014 [place].

## 2014-09-08 NOTE — Progress Notes (Signed)
Meagan Key [birth year] [id number]        68 y.o.  G2P2002 for follow up exam  Past medical history,surgical history, problem list, medications, allergies, family history and social history were all reviewed and documented as reviewed in the EPIC chart.  ROS:  12 system ROS performed with pertinent positives and negatives included in the history, assessment and plan.   Additional significant findings :  none   Exam: [name] Vitals:   09/08/14 1126  BP: 130/80  Height: 5\' 2"  (1.575 m)  Weight: 148 lb (67.132 kg)   General appearance:  Normal affect, orientation and appearance. Skin: Grossly normal HEENT: Without gross lesions.  No cervical or supraclavicular adenopathy. Thyroid normal.  Lungs:  Clear without wheezing, rales or rhonchi Cardiac: RR, without RMG Abdominal:  Soft, nontender, without masses, guarding, rebound, organomegaly or hernia Breasts:  Examined lying and sitting without masses, retractions, discharge or axillary adenopathy. Pelvic:  Ext/BUS/vagina with generalized atrophic changes  Cervix with atrophic changes  Uterus anteverted, normal size, shape and contour, midline and mobile nontender   Adnexa  Without masses or tenderness    Anus and perineum  Normal   Rectovaginal  Normal sphincter tone without palpated masses or tenderness.    Assessment/Plan:  68 y.o. [id number] female for follow up exam.   1. Postmenopausal/atrophic genital changes. Patient was on Osphena but wants to stop due to the price. She wants to try over-the-counter moisturizers. Is not sexually active and that is not an issue. Not having significant hot flashes or night sweats. Past history of endometriosis. Doing well without discomfort or other symptoms. No vaginal bleeding. Will continue to monitor. Report any vaginal bleeding or issues with vaginal dryness that she wants to address with medication. 2. Osteopenia. DEXA 2014 T score -2.3 FRAX 13%/2.3%. Reports having a normal  vitamin D level within the past year. Increased calcium and vitamin D recommendations reviewed. 3. Mammography 10/2013. Repeat this coming January. SBE monthly reviewed. 4. Colonoscopy 2012. Repeat at their recommended interval. 5. Pap smear 2013. No Pap smear done today. No history of significant abnormal Pap smears. Options to stop screening altogether versus less frequent screening intervals reviewed that she is over the age of 56. Will readdress on an annual basis. 6. Health maintenance. No routine blood work done as she reports this done through her primary physician's office. Follow up 1 year, sooner as needed.     [name] MD, 11:53 AM 09/08/2014

## 2014-09-09 LAB — URINALYSIS W MICROSCOPIC + REFLEX CULTURE
Bacteria, UA: NONE SEEN
Bilirubin Urine: NEGATIVE
CRYSTALS: NONE SEEN
Casts: NONE SEEN
Glucose, UA: NEGATIVE mg/dL
HGB URINE DIPSTICK: NEGATIVE
KETONES UR: NEGATIVE mg/dL
Leukocytes, UA: NEGATIVE
Nitrite: NEGATIVE
Protein, ur: 30 mg/dL — AB
Specific Gravity, Urine: 1.025 (ref 1.005–1.030)
UROBILINOGEN UA: 0.2 mg/dL (ref 0.0–1.0)
pH: 5 (ref 5.0–8.0)

## 2014-09-11 DIAGNOSIS — E785 Hyperlipidemia, unspecified: Secondary | ICD-10-CM | POA: Diagnosis not present

## 2014-09-11 DIAGNOSIS — Z6826 Body mass index (BMI) 26.0-26.9, adult: Secondary | ICD-10-CM | POA: Diagnosis not present

## 2014-09-11 DIAGNOSIS — Z23 Encounter for immunization: Secondary | ICD-10-CM | POA: Diagnosis not present

## 2014-09-11 DIAGNOSIS — I10 Essential (primary) hypertension: Secondary | ICD-10-CM | POA: Diagnosis not present

## 2014-10-03 DIAGNOSIS — H40033 Anatomical narrow angle, bilateral: Secondary | ICD-10-CM | POA: Diagnosis not present

## 2014-10-03 DIAGNOSIS — H2513 Age-related nuclear cataract, bilateral: Secondary | ICD-10-CM | POA: Diagnosis not present

## 2014-10-03 DIAGNOSIS — H25013 Cortical age-related cataract, bilateral: Secondary | ICD-10-CM | POA: Diagnosis not present

## 2014-10-03 DIAGNOSIS — D3132 Benign neoplasm of left choroid: Secondary | ICD-10-CM | POA: Diagnosis not present

## 2014-10-31 ENCOUNTER — Other Ambulatory Visit: Payer: Self-pay

## 2014-10-31 DIAGNOSIS — Z1231 Encounter for screening mammogram for malignant neoplasm of breast: Secondary | ICD-10-CM

## 2014-11-09 ENCOUNTER — Ambulatory Visit
Admission: RE | Admit: 2014-11-09 | Discharge: 2014-11-09 | Disposition: A | Discharge status: Home / Self Care | Payer: Medicare Other | Source: Ambulatory Visit

## 2014-11-09 DIAGNOSIS — Z1231 Encounter for screening mammogram for malignant neoplasm of breast: Secondary | ICD-10-CM | POA: Diagnosis not present

## 2015-03-14 DIAGNOSIS — N390 Urinary tract infection, site not specified: Secondary | ICD-10-CM | POA: Diagnosis not present

## 2015-03-14 DIAGNOSIS — E049 Nontoxic goiter, unspecified: Secondary | ICD-10-CM | POA: Diagnosis not present

## 2015-03-14 DIAGNOSIS — E785 Hyperlipidemia, unspecified: Secondary | ICD-10-CM | POA: Diagnosis not present

## 2015-03-14 DIAGNOSIS — I10 Essential (primary) hypertension: Secondary | ICD-10-CM | POA: Diagnosis not present

## 2015-03-14 DIAGNOSIS — Z0000 Encounter for general adult medical examination without abnormal findings: Secondary | ICD-10-CM | POA: Diagnosis not present

## 2015-03-14 DIAGNOSIS — M859 Disorder of bone density and structure, unspecified: Secondary | ICD-10-CM | POA: Diagnosis not present

## 2015-03-23 DIAGNOSIS — N952 Postmenopausal atrophic vaginitis: Secondary | ICD-10-CM | POA: Diagnosis not present

## 2015-03-23 DIAGNOSIS — Z1389 Encounter for screening for other disorder: Secondary | ICD-10-CM | POA: Diagnosis not present

## 2015-03-23 DIAGNOSIS — Z0000 Encounter for general adult medical examination without abnormal findings: Secondary | ICD-10-CM | POA: Diagnosis not present

## 2015-03-23 DIAGNOSIS — R1310 Dysphagia, unspecified: Secondary | ICD-10-CM | POA: Diagnosis not present

## 2015-03-23 DIAGNOSIS — K219 Gastro-esophageal reflux disease without esophagitis: Secondary | ICD-10-CM | POA: Diagnosis not present

## 2015-03-23 DIAGNOSIS — Z800 Family history of malignant neoplasm of digestive organs: Secondary | ICD-10-CM | POA: Diagnosis not present

## 2015-03-23 DIAGNOSIS — E049 Nontoxic goiter, unspecified: Secondary | ICD-10-CM | POA: Diagnosis not present

## 2015-03-23 DIAGNOSIS — E559 Vitamin D deficiency, unspecified: Secondary | ICD-10-CM | POA: Diagnosis not present

## 2015-03-23 DIAGNOSIS — G4733 Obstructive sleep apnea (adult) (pediatric): Secondary | ICD-10-CM | POA: Diagnosis not present

## 2015-03-23 DIAGNOSIS — Z6826 Body mass index (BMI) 26.0-26.9, adult: Secondary | ICD-10-CM | POA: Diagnosis not present

## 2015-03-23 DIAGNOSIS — J45909 Unspecified asthma, uncomplicated: Secondary | ICD-10-CM | POA: Diagnosis not present

## 2015-03-23 DIAGNOSIS — H409 Unspecified glaucoma: Secondary | ICD-10-CM | POA: Diagnosis not present

## 2015-04-18 DIAGNOSIS — L602 Onychogryphosis: Secondary | ICD-10-CM | POA: Diagnosis not present

## 2015-04-18 DIAGNOSIS — M2041 Other hammer toe(s) (acquired), right foot: Secondary | ICD-10-CM | POA: Diagnosis not present

## 2015-04-18 DIAGNOSIS — L600 Ingrowing nail: Secondary | ICD-10-CM | POA: Diagnosis not present

## 2015-05-28 DIAGNOSIS — Z1212 Encounter for screening for malignant neoplasm of rectum: Secondary | ICD-10-CM | POA: Diagnosis not present

## 2015-07-02 DIAGNOSIS — M859 Disorder of bone density and structure, unspecified: Secondary | ICD-10-CM | POA: Diagnosis not present

## 2015-08-18 DIAGNOSIS — Z23 Encounter for immunization: Secondary | ICD-10-CM | POA: Diagnosis not present

## 2015-08-30 DIAGNOSIS — M8580 Other specified disorders of bone density and structure, unspecified site: Secondary | ICD-10-CM | POA: Diagnosis not present

## 2015-08-30 DIAGNOSIS — E785 Hyperlipidemia, unspecified: Secondary | ICD-10-CM | POA: Diagnosis not present

## 2015-08-30 DIAGNOSIS — K219 Gastro-esophageal reflux disease without esophagitis: Secondary | ICD-10-CM | POA: Diagnosis not present

## 2015-08-30 DIAGNOSIS — I10 Essential (primary) hypertension: Secondary | ICD-10-CM | POA: Diagnosis not present

## 2015-08-30 DIAGNOSIS — G4733 Obstructive sleep apnea (adult) (pediatric): Secondary | ICD-10-CM | POA: Diagnosis not present

## 2015-08-30 DIAGNOSIS — J45909 Unspecified asthma, uncomplicated: Secondary | ICD-10-CM | POA: Diagnosis not present

## 2015-08-30 DIAGNOSIS — Z6827 Body mass index (BMI) 27.0-27.9, adult: Secondary | ICD-10-CM | POA: Diagnosis not present

## 2015-09-12 ENCOUNTER — Ambulatory Visit (INDEPENDENT_AMBULATORY_CARE_PROVIDER_SITE_OTHER): Payer: Medicare Other | Admitting: Gynecology

## 2015-09-12 ENCOUNTER — Encounter: Payer: Self-pay | Admitting: Gynecology

## 2015-09-12 ENCOUNTER — Other Ambulatory Visit (HOSPITAL_COMMUNITY)
Admission: RE | Admit: 2015-09-12 | Discharge: 2015-09-12 | Disposition: A | Discharge status: Home / Self Care | Payer: Medicare Other | Source: Ambulatory Visit | Attending: Gynecology | Admitting: Gynecology

## 2015-09-12 VITALS — BP 140/86 | Ht 62.0 in | Wt 148.0 lb

## 2015-09-12 DIAGNOSIS — N952 Postmenopausal atrophic vaginitis: Secondary | ICD-10-CM

## 2015-09-12 DIAGNOSIS — Z124 Encounter for screening for malignant neoplasm of cervix: Secondary | ICD-10-CM

## 2015-09-12 DIAGNOSIS — M8580 Other specified disorders of bone density and structure, unspecified site: Secondary | ICD-10-CM | POA: Diagnosis not present

## 2015-09-12 DIAGNOSIS — Z01419 Encounter for gynecological examination (general) (routine) without abnormal findings: Secondary | ICD-10-CM

## 2015-09-12 NOTE — Addendum Note (Signed)
Addended by: Nelva Nay on: 09/12/2015 12:28 PM   Modules accepted: Orders

## 2015-09-12 NOTE — Patient Instructions (Signed)

## 2015-09-12 NOTE — Progress Notes (Signed)
Meagan Key 02/14/1946 [id number]        69 y.o.  G2P2002  For breast and pelvic exam.  Past medical history,surgical history, problem list, medications, allergies, family history and social history were all reviewed and documented as reviewed in the EPIC chart.  ROS:  Performed with pertinent positives and negatives included in the history, assessment and plan.   Additional significant findings :  none   Exam: [name] Vitals:   09/12/15 1138  BP: 140/86  Height: 5\' 2"  (1.575 m)  Weight: 148 lb (67.132 kg)   General appearance:  Normal affect, orientation and appearance. Skin: Grossly normal HEENT: Without gross lesions.  No cervical or supraclavicular adenopathy. Thyroid normal.  Lungs:  Clear without wheezing, rales or rhonchi Cardiac: RR, without RMG Abdominal:  Soft, nontender, without masses, guarding, rebound, organomegaly or hernia Breasts:  Examined lying and sitting without masses, retractions, discharge or axillary adenopathy. Pelvic:  Ext/BUS/vagina with atrophic changes  Cervix with atrophic changes. Pap smear done  Uterus anteverted, normal size, shape and contour, midline and mobile nontender   Adnexa  Without masses or tenderness    Anus and perineum  Normal   Rectovaginal  Normal sphincter tone without palpated masses or tenderness.    Assessment/Plan:  69 y.o. [id number] female for breast and pelvic exam.   1. Postmenopausal/atrophic genital changes. Had been on Osphena discontinued. Does not seem to be a big issue at this point. I again discussed options and she's not interested in doing anything for now. No vaginal bleeding or significant hot flashes or night sweats. Continue to monitor and report any issues or bleeding. 2. Osteopenia. Reports recent DEXA at Dr. Keane Police office. They're continuing to follow her without medication. She does supplement vitamin D and calcium. History of Fosamax use previously by Dr. Cherylann Banas but discontinued for several  years. She will continue to follow up with Dr. Virgina Jock in reference to this as he has the reports and is doing the bone densities. 3. Mammography 10/2014. Continue with annual mammography when due. SBE monthly reviewed. 4. Colonoscopy 2012. Repeat at their recommended interval. 5. Pap smear 2013. Pap smear done today. No history of significant abnormal Pap smears. Options to stop screening per current screening guidelines based on age reviewed. Will readdress on annual basis. 6. Health maintenance. No routine lab work done as this is done at her primary physician's office. Follow up in one year, sooner as needed.   [name] MD, 12:11 PM 09/12/2015

## 2015-09-19 LAB — CYTOLOGY - PAP

## 2015-10-09 DIAGNOSIS — H2513 Age-related nuclear cataract, bilateral: Secondary | ICD-10-CM | POA: Diagnosis not present

## 2015-10-09 DIAGNOSIS — H52203 Unspecified astigmatism, bilateral: Secondary | ICD-10-CM | POA: Diagnosis not present

## 2015-10-09 DIAGNOSIS — H25013 Cortical age-related cataract, bilateral: Secondary | ICD-10-CM | POA: Diagnosis not present

## 2015-10-09 DIAGNOSIS — H40033 Anatomical narrow angle, bilateral: Secondary | ICD-10-CM | POA: Diagnosis not present

## 2015-10-26 ENCOUNTER — Other Ambulatory Visit: Payer: Self-pay

## 2015-10-26 DIAGNOSIS — Z1231 Encounter for screening mammogram for malignant neoplasm of breast: Secondary | ICD-10-CM

## 2015-11-14 ENCOUNTER — Ambulatory Visit
Admission: RE | Admit: 2015-11-14 | Discharge: 2015-11-14 | Disposition: A | Discharge status: Home / Self Care | Payer: Medicare Other | Source: Ambulatory Visit

## 2015-11-14 ENCOUNTER — Other Ambulatory Visit: Payer: Self-pay | Admitting: Internal Medicine

## 2015-11-14 DIAGNOSIS — Z1231 Encounter for screening mammogram for malignant neoplasm of breast: Secondary | ICD-10-CM | POA: Diagnosis not present

## 2015-11-14 DIAGNOSIS — R928 Other abnormal and inconclusive findings on diagnostic imaging of breast: Secondary | ICD-10-CM

## 2015-11-20 ENCOUNTER — Ambulatory Visit
Admission: RE | Admit: 2015-11-20 | Discharge: 2015-11-20 | Disposition: A | Discharge status: Home / Self Care | Payer: Medicare Other | Source: Ambulatory Visit | Attending: Internal Medicine | Admitting: Internal Medicine

## 2015-11-20 DIAGNOSIS — N63 Unspecified lump in breast: Secondary | ICD-10-CM | POA: Diagnosis not present

## 2015-11-20 DIAGNOSIS — R928 Other abnormal and inconclusive findings on diagnostic imaging of breast: Secondary | ICD-10-CM

## 2015-11-20 DIAGNOSIS — N6001 Solitary cyst of right breast: Secondary | ICD-10-CM | POA: Diagnosis not present

## 2015-11-22 DIAGNOSIS — H2589 Other age-related cataract: Secondary | ICD-10-CM | POA: Diagnosis not present

## 2015-11-22 DIAGNOSIS — H2513 Age-related nuclear cataract, bilateral: Secondary | ICD-10-CM | POA: Diagnosis not present

## 2015-11-22 DIAGNOSIS — H25013 Cortical age-related cataract, bilateral: Secondary | ICD-10-CM | POA: Diagnosis not present

## 2016-01-21 DIAGNOSIS — I10 Essential (primary) hypertension: Secondary | ICD-10-CM | POA: Diagnosis not present

## 2016-01-21 DIAGNOSIS — M859 Disorder of bone density and structure, unspecified: Secondary | ICD-10-CM | POA: Diagnosis not present

## 2016-01-21 DIAGNOSIS — Z6827 Body mass index (BMI) 27.0-27.9, adult: Secondary | ICD-10-CM | POA: Diagnosis not present

## 2016-01-21 DIAGNOSIS — M542 Cervicalgia: Secondary | ICD-10-CM | POA: Diagnosis not present

## 2016-01-21 DIAGNOSIS — M5412 Radiculopathy, cervical region: Secondary | ICD-10-CM | POA: Diagnosis not present

## 2016-01-21 DIAGNOSIS — M5032 Other cervical disc degeneration, mid-cervical region, unspecified level: Secondary | ICD-10-CM | POA: Diagnosis not present

## 2016-02-04 DIAGNOSIS — H40033 Anatomical narrow angle, bilateral: Secondary | ICD-10-CM | POA: Insufficient documentation

## 2016-02-04 DIAGNOSIS — H268 Other specified cataract: Secondary | ICD-10-CM | POA: Insufficient documentation

## 2016-02-06 DIAGNOSIS — Z961 Presence of intraocular lens: Secondary | ICD-10-CM | POA: Insufficient documentation

## 2016-02-06 DIAGNOSIS — H2512 Age-related nuclear cataract, left eye: Secondary | ICD-10-CM | POA: Diagnosis not present

## 2016-02-06 DIAGNOSIS — H52222 Regular astigmatism, left eye: Secondary | ICD-10-CM | POA: Diagnosis not present

## 2016-02-27 DIAGNOSIS — H52221 Regular astigmatism, right eye: Secondary | ICD-10-CM | POA: Diagnosis not present

## 2016-02-27 DIAGNOSIS — Z961 Presence of intraocular lens: Secondary | ICD-10-CM | POA: Insufficient documentation

## 2016-02-27 DIAGNOSIS — H2511 Age-related nuclear cataract, right eye: Secondary | ICD-10-CM | POA: Diagnosis not present

## 2016-03-25 DIAGNOSIS — E048 Other specified nontoxic goiter: Secondary | ICD-10-CM | POA: Diagnosis not present

## 2016-03-25 DIAGNOSIS — N390 Urinary tract infection, site not specified: Secondary | ICD-10-CM | POA: Diagnosis not present

## 2016-03-25 DIAGNOSIS — I10 Essential (primary) hypertension: Secondary | ICD-10-CM | POA: Diagnosis not present

## 2016-03-25 DIAGNOSIS — E784 Other hyperlipidemia: Secondary | ICD-10-CM | POA: Diagnosis not present

## 2016-03-25 DIAGNOSIS — R8290 Unspecified abnormal findings in urine: Secondary | ICD-10-CM | POA: Diagnosis not present

## 2016-03-25 DIAGNOSIS — M859 Disorder of bone density and structure, unspecified: Secondary | ICD-10-CM | POA: Diagnosis not present

## 2016-04-01 DIAGNOSIS — Z0000 Encounter for general adult medical examination without abnormal findings: Secondary | ICD-10-CM | POA: Diagnosis not present

## 2016-04-01 DIAGNOSIS — R3129 Other microscopic hematuria: Secondary | ICD-10-CM | POA: Diagnosis not present

## 2016-04-01 DIAGNOSIS — E784 Other hyperlipidemia: Secondary | ICD-10-CM | POA: Diagnosis not present

## 2016-04-01 DIAGNOSIS — N3941 Urge incontinence: Secondary | ICD-10-CM | POA: Insufficient documentation

## 2016-04-01 DIAGNOSIS — G4733 Obstructive sleep apnea (adult) (pediatric): Secondary | ICD-10-CM | POA: Diagnosis not present

## 2016-04-01 DIAGNOSIS — E048 Other specified nontoxic goiter: Secondary | ICD-10-CM | POA: Diagnosis not present

## 2016-04-01 DIAGNOSIS — Z6827 Body mass index (BMI) 27.0-27.9, adult: Secondary | ICD-10-CM | POA: Diagnosis not present

## 2016-04-01 DIAGNOSIS — J45998 Other asthma: Secondary | ICD-10-CM | POA: Diagnosis not present

## 2016-04-01 DIAGNOSIS — I10 Essential (primary) hypertension: Secondary | ICD-10-CM | POA: Diagnosis not present

## 2016-04-01 DIAGNOSIS — E559 Vitamin D deficiency, unspecified: Secondary | ICD-10-CM | POA: Diagnosis not present

## 2016-04-01 DIAGNOSIS — M1990 Unspecified osteoarthritis, unspecified site: Secondary | ICD-10-CM | POA: Insufficient documentation

## 2016-04-01 DIAGNOSIS — H4089 Other specified glaucoma: Secondary | ICD-10-CM | POA: Diagnosis not present

## 2016-04-01 DIAGNOSIS — Z1389 Encounter for screening for other disorder: Secondary | ICD-10-CM | POA: Diagnosis not present

## 2016-04-04 DIAGNOSIS — L720 Epidermal cyst: Secondary | ICD-10-CM | POA: Diagnosis not present

## 2016-04-04 DIAGNOSIS — L723 Sebaceous cyst: Secondary | ICD-10-CM | POA: Diagnosis not present

## 2016-04-04 DIAGNOSIS — D225 Melanocytic nevi of trunk: Secondary | ICD-10-CM | POA: Diagnosis not present

## 2016-04-04 DIAGNOSIS — L821 Other seborrheic keratosis: Secondary | ICD-10-CM | POA: Diagnosis not present

## 2016-04-04 DIAGNOSIS — D1801 Hemangioma of skin and subcutaneous tissue: Secondary | ICD-10-CM | POA: Diagnosis not present

## 2016-04-05 DIAGNOSIS — M1990 Unspecified osteoarthritis, unspecified site: Secondary | ICD-10-CM | POA: Diagnosis not present

## 2016-04-05 DIAGNOSIS — I10 Essential (primary) hypertension: Secondary | ICD-10-CM | POA: Diagnosis not present

## 2016-04-05 DIAGNOSIS — H4089 Other specified glaucoma: Secondary | ICD-10-CM | POA: Diagnosis not present

## 2016-04-05 DIAGNOSIS — E559 Vitamin D deficiency, unspecified: Secondary | ICD-10-CM | POA: Diagnosis not present

## 2016-04-05 DIAGNOSIS — Z0000 Encounter for general adult medical examination without abnormal findings: Secondary | ICD-10-CM | POA: Diagnosis not present

## 2016-04-05 DIAGNOSIS — Z6827 Body mass index (BMI) 27.0-27.9, adult: Secondary | ICD-10-CM | POA: Diagnosis not present

## 2016-04-05 DIAGNOSIS — J45998 Other asthma: Secondary | ICD-10-CM | POA: Diagnosis not present

## 2016-04-05 DIAGNOSIS — G4733 Obstructive sleep apnea (adult) (pediatric): Secondary | ICD-10-CM | POA: Diagnosis not present

## 2016-04-05 DIAGNOSIS — R3129 Other microscopic hematuria: Secondary | ICD-10-CM | POA: Diagnosis not present

## 2016-04-05 DIAGNOSIS — Z1389 Encounter for screening for other disorder: Secondary | ICD-10-CM | POA: Diagnosis not present

## 2016-04-05 DIAGNOSIS — E048 Other specified nontoxic goiter: Secondary | ICD-10-CM | POA: Diagnosis not present

## 2016-04-05 DIAGNOSIS — E784 Other hyperlipidemia: Secondary | ICD-10-CM | POA: Diagnosis not present

## 2016-05-01 DIAGNOSIS — Z1212 Encounter for screening for malignant neoplasm of rectum: Secondary | ICD-10-CM | POA: Diagnosis not present

## 2016-07-30 DIAGNOSIS — Z23 Encounter for immunization: Secondary | ICD-10-CM | POA: Diagnosis not present

## 2016-08-25 ENCOUNTER — Telehealth: Payer: Self-pay | Admitting: Gastroenterology

## 2016-08-25 NOTE — Telephone Encounter (Signed)
Happy to accept her as a patient I have emailed her regarding her family history and will use this information to determine when to repeat screening. Her last 2 colonoscopies have been complete and normal. Her last colonoscopy was 2012 Will await to hear response from her

## 2016-08-25 NOTE — Telephone Encounter (Signed)
I conversed with the patient via email Her mother had colon cancer and died from the same at age 72 Her father also had precancerous polyps and likely colon cancer I recommend repeat screening colonoscopy at this time Please contact her to arrange previsit and colonoscopy

## 2016-08-27 ENCOUNTER — Encounter: Payer: Self-pay | Admitting: Internal Medicine

## 2016-09-15 ENCOUNTER — Ambulatory Visit (INDEPENDENT_AMBULATORY_CARE_PROVIDER_SITE_OTHER): Payer: Medicare Other | Admitting: Gynecology

## 2016-09-15 ENCOUNTER — Encounter: Payer: Self-pay | Admitting: Gynecology

## 2016-09-15 VITALS — BP 120/76 | Ht 62.0 in | Wt 148.0 lb

## 2016-09-15 DIAGNOSIS — Z01411 Encounter for gynecological examination (general) (routine) with abnormal findings: Secondary | ICD-10-CM | POA: Diagnosis not present

## 2016-09-15 DIAGNOSIS — N952 Postmenopausal atrophic vaginitis: Secondary | ICD-10-CM

## 2016-09-15 DIAGNOSIS — M8580 Other specified disorders of bone density and structure, unspecified site: Secondary | ICD-10-CM

## 2016-09-15 DIAGNOSIS — Z124 Encounter for screening for malignant neoplasm of cervix: Secondary | ICD-10-CM | POA: Diagnosis not present

## 2016-09-15 NOTE — Patient Instructions (Signed)

## 2016-09-15 NOTE — Progress Notes (Signed)
    MADIGAN HELLUMS Apr 25, 1946 [id number]        70 y.o.  [id number]  for breast and pelvic exam. Patient is also complaining of vaginal dryness with irritation which seems to be worsening over the past several months. Does have a history of being on HRT in the past as well as Osphena trial.  Past medical history,surgical history, problem list, medications, allergies, family history and social history were all reviewed and documented as reviewed in the EPIC chart.  ROS:  Performed with pertinent positives and negatives included in the history, assessment and plan.   Additional significant findings :  None   Exam: [name] assistant Vitals:   09/15/16 1128  BP: 120/76  Weight: 148 lb (67.1 kg)  Height: 5\' 2"  (1.575 m)   Body mass index is 27.07 kg/m.  General appearance:  Normal affect, orientation and appearance. Skin: Grossly normal HEENT: Without gross lesions.  No cervical or supraclavicular adenopathy. Thyroid normal.  Lungs:  Clear without wheezing, rales or rhonchi Cardiac: RR, without RMG Abdominal:  Soft, nontender, without masses, guarding, rebound, organomegaly or hernia Breasts:  Examined lying and sitting without masses, retractions, discharge or axillary adenopathy. Pelvic:  Ext, BUS, Vagina with atrophic changes  Cervix with atrophic changes  Uterus anteverted, normal size, shape and contour, midline and mobile nontender   Adnexa without masses or tenderness    Anus and perineum normal   Rectovaginal normal sphincter tone without palpated masses or tenderness.    Assessment/Plan:  70 y.o. [id number] female for breast and pelvic exam.   1. Vaginal atrophy. Not having other symptoms such as night sweats or hot flushes. No vaginal bleeding. Reviewed options for her vaginal atrophy to include OTC products such as Replens. Vaginal estrogen such as cream, Vagifem, ring reviewed. Lastly reviewed Osphena. The risks/benefits of each choice was discussed to include absorption  with systemic effects such as stroke heart attack DVT and breast cancer issues. At this point the patient wants to try OTC products such as Replens. She will call me if she wants to try the vaginal estrogen. 2. Mammography 10/2015. Repeat when due this coming year. SBE monthly reviewed. 3. Pap smear 2016. Pap smear done today. No history of significant abnormal Pap smears previously. Options to stop screening based on age was discussed and patients uncomfortable with this. 4. Colonoscopy scheduled this coming January. 5. Osteopenia. Being followed by Dr. Virgina Jock with reported DEXA last year. We'll continue to follow up with them in reference to this. 6. Health maintenance. No routine lab work done as she does this elsewhere. Follow up in one year, sooner as needed.   Additional time in excess of her breast and pelvic exam was spent in direct face to face counseling and coordination of care in regards to her vaginal atrophy and treatment options.    [name] MD, 11:58 AM 09/15/2016

## 2016-09-15 NOTE — Addendum Note (Signed)
Addended by: Nelva Nay on: 09/15/2016 12:18 PM   Modules accepted: Orders

## 2016-09-18 LAB — PAP IG W/ RFLX HPV ASCU

## 2016-10-03 DIAGNOSIS — Z6827 Body mass index (BMI) 27.0-27.9, adult: Secondary | ICD-10-CM | POA: Diagnosis not present

## 2016-10-03 DIAGNOSIS — N952 Postmenopausal atrophic vaginitis: Secondary | ICD-10-CM | POA: Diagnosis not present

## 2016-10-03 DIAGNOSIS — H4089 Other specified glaucoma: Secondary | ICD-10-CM | POA: Diagnosis not present

## 2016-10-03 DIAGNOSIS — I10 Essential (primary) hypertension: Secondary | ICD-10-CM | POA: Diagnosis not present

## 2016-10-03 DIAGNOSIS — G4733 Obstructive sleep apnea (adult) (pediatric): Secondary | ICD-10-CM | POA: Diagnosis not present

## 2016-10-03 DIAGNOSIS — E784 Other hyperlipidemia: Secondary | ICD-10-CM | POA: Diagnosis not present

## 2016-10-03 DIAGNOSIS — N3941 Urge incontinence: Secondary | ICD-10-CM | POA: Diagnosis not present

## 2016-10-03 DIAGNOSIS — M1990 Unspecified osteoarthritis, unspecified site: Secondary | ICD-10-CM | POA: Diagnosis not present

## 2016-10-03 DIAGNOSIS — K219 Gastro-esophageal reflux disease without esophagitis: Secondary | ICD-10-CM | POA: Diagnosis not present

## 2016-10-06 DIAGNOSIS — H40023 Open angle with borderline findings, high risk, bilateral: Secondary | ICD-10-CM | POA: Diagnosis not present

## 2016-10-27 ENCOUNTER — Ambulatory Visit (AMBULATORY_SURGERY_CENTER): Payer: Self-pay | Admitting: *Deleted

## 2016-10-27 VITALS — Ht 62.0 in | Wt 150.0 lb

## 2016-10-27 DIAGNOSIS — Z800 Family history of malignant neoplasm of digestive organs: Secondary | ICD-10-CM

## 2016-10-27 MED ORDER — NA SULFATE-K SULFATE-MG SULF 17.5-3.13-1.6 GM/177ML PO SOLN: ORAL | 0 refills | Status: DC

## 2016-10-27 NOTE — Progress Notes (Signed)
Patient denies any allergies to eggs or soy. Patient denies any problems with anesthesia/sedation. Patient denies any oxygen use at home and does not take any diet/weight loss medications. Patient declined EMMI education video.   

## 2016-10-31 ENCOUNTER — Other Ambulatory Visit: Payer: Self-pay | Admitting: Internal Medicine

## 2016-10-31 DIAGNOSIS — Z1231 Encounter for screening mammogram for malignant neoplasm of breast: Secondary | ICD-10-CM

## 2016-11-10 ENCOUNTER — Encounter: Payer: Self-pay | Admitting: Internal Medicine

## 2016-11-10 ENCOUNTER — Ambulatory Visit (AMBULATORY_SURGERY_CENTER): Payer: Medicare Other | Admitting: Internal Medicine

## 2016-11-10 VITALS — BP 155/76 | HR 60 | Temp 95.1°F | Resp 16 | Ht 62.0 in | Wt 150.0 lb

## 2016-11-10 DIAGNOSIS — I10 Essential (primary) hypertension: Secondary | ICD-10-CM | POA: Diagnosis not present

## 2016-11-10 DIAGNOSIS — D122 Benign neoplasm of ascending colon: Secondary | ICD-10-CM | POA: Diagnosis not present

## 2016-11-10 DIAGNOSIS — K648 Other hemorrhoids: Secondary | ICD-10-CM | POA: Diagnosis not present

## 2016-11-10 DIAGNOSIS — Z800 Family history of malignant neoplasm of digestive organs: Secondary | ICD-10-CM

## 2016-11-10 DIAGNOSIS — Z1212 Encounter for screening for malignant neoplasm of rectum: Secondary | ICD-10-CM | POA: Diagnosis not present

## 2016-11-10 DIAGNOSIS — D123 Benign neoplasm of transverse colon: Secondary | ICD-10-CM

## 2016-11-10 DIAGNOSIS — Z1211 Encounter for screening for malignant neoplasm of colon: Secondary | ICD-10-CM | POA: Diagnosis not present

## 2016-11-10 DIAGNOSIS — K635 Polyp of colon: Secondary | ICD-10-CM

## 2016-11-10 DIAGNOSIS — G4733 Obstructive sleep apnea (adult) (pediatric): Secondary | ICD-10-CM | POA: Diagnosis not present

## 2016-11-10 MED ORDER — SODIUM CHLORIDE 0.9 % IV SOLN: 500.0000 mL | INTRAVENOUS | Status: DC

## 2016-11-10 NOTE — Op Note (Signed)
[facility] Patient Name: Meagan Key Procedure Date: 11/10/2016 9:51 AM MRN: [id number] Endoscopist: Jerene Bears , MD Age: 71 Referring MD:  Date of Birth: 1946/04/12 Gender: Female Account #: [id number] Procedure:                Colonoscopy Indications:              Screening patient at increased risk: Family history                            of colorectal cancer in multiple 1st-degree                            relatives, Last colonoscopy 5 years ago Medicines:                Monitored Anesthesia Care Procedure:                Pre-Anesthesia Assessment:                           - Prior to the procedure, a History and Physical                            was performed, and patient medications and                            allergies were reviewed. The patient's tolerance of                            previous anesthesia was also reviewed. The risks                            and benefits of the procedure and the sedation                            options and risks were discussed with the patient.                            All questions were answered, and informed consent                            was obtained. Prior Anticoagulants: The patient has                            taken no previous anticoagulant or antiplatelet                            agents. ASA Grade Assessment: III - A patient with                            severe systemic disease. After reviewing the risks                            and benefits, the patient was deemed in  satisfactory condition to undergo the procedure.                           After obtaining informed consent, the colonoscope                            was passed under direct vision. Throughout the                            procedure, the patient's blood pressure, pulse, and                            oxygen saturations were monitored continuously. The                            Model PCF-H190L  [id number]) scope was introduced                            through the anus and advanced to the the cecum,                            identified by appendiceal orifice and ileocecal                            valve. The colonoscopy was performed without                            difficulty. The patient tolerated the procedure                            well. The quality of the bowel preparation was                            good. The ileocecal valve, appendiceal orifice, and                            rectum were photographed. Scope In: 10:18:34 AM Scope Out: 10:40:46 AM Scope Withdrawal Time: 0 hours 13 minutes 59 seconds  Total Procedure Duration: 0 hours 22 minutes 12 seconds  Findings:                 The perianal and digital rectal examinations were                            normal.                           A 6 mm polyp was found in the ascending colon. The                            polyp was sessile. The polyp was removed with a                            cold snare. Resection and retrieval were complete.  Two sessile polyps were found in the hepatic                            flexure. The polyps were 3 to 5 mm in size. These                            polyps were removed with a cold snare. Resection                            and retrieval were complete.                           A 5 mm polyp was found in the transverse colon. The                            polyp was sessile. The polyp was removed with a                            cold snare. Resection and retrieval were complete.                           Internal hemorrhoids were found during                            retroflexion. The hemorrhoids were small. Complications:            No immediate complications. Estimated Blood Loss:     Estimated blood loss was minimal. Impression:               - One 6 mm polyp in the ascending colon, removed                            with a cold snare.  Resected and retrieved.                           - Two 3 to 5 mm polyps at the hepatic flexure,                            removed with a cold snare. Resected and retrieved.                           - One 5 mm polyp in the transverse colon, removed                            with a cold snare. Resected and retrieved.                           - Small internal hemorrhoids. Recommendation:           - Patient has a contact number available for                            emergencies. The signs and symptoms of potential  delayed complications were discussed with the                            patient. Return to normal activities tomorrow.                            Written discharge instructions were provided to the                            patient.                           - Resume previous diet.                           - Continue present medications.                           - Await pathology results.                           - Repeat colonoscopy is recommended. The                            colonoscopy date will be determined after pathology                            results from today's exam become available for                            review. Jerene Bears, MD 11/10/2016 10:43:06 AM This report has been signed electronically.

## 2016-11-10 NOTE — Patient Instructions (Signed)
YOU HAD AN ENDOSCOPIC PROCEDURE TODAY AT [facility]:   Refer to the procedure report that was given to you for any specific questions about what was found during the examination.  If the procedure report does not answer your questions, please call your gastroenterologist to clarify.  If you requested that your care partner not be given the details of your procedure findings, then the procedure report has been included in a sealed envelope for you to review at your convenience later.  YOU SHOULD EXPECT: Some feelings of bloating in the abdomen. Passage of more gas than usual.  Walking can help get rid of the air that was put into your GI tract during the procedure and reduce the bloating. If you had a lower endoscopy (such as a colonoscopy or flexible sigmoidoscopy) you may notice spotting of blood in your stool or on the toilet paper. If you underwent a bowel prep for your procedure, you may not have a normal bowel movement for a few days.  Please Note:  You might notice some irritation and congestion in your nose or some drainage.  This is from the oxygen used during your procedure.  There is no need for concern and it should clear up in a day or so.  SYMPTOMS TO REPORT IMMEDIATELY:   Following lower endoscopy (colonoscopy or flexible sigmoidoscopy):  Excessive amounts of blood in the stool  Significant tenderness or worsening of abdominal pains  Swelling of the abdomen that is new, acute  Fever of 100F or higher    For urgent or emergent issues, a gastroenterologist can be reached at any hour by calling [phone].   DIET:  We do recommend a small meal at first, but then you may proceed to your regular diet.  Drink plenty of fluids but you should avoid alcoholic beverages for 24 hours.  ACTIVITY:  You should plan to take it easy for the rest of today and you should NOT DRIVE or use heavy machinery until tomorrow (because of the sedation medicines used during the test).     FOLLOW UP: Our staff will call the number listed on your records the next business day following your procedure to check on you and address any questions or concerns that you may have regarding the information given to you following your procedure. If we do not reach you, we will leave a message.  However, if you are feeling well and you are not experiencing any problems, there is no need to return our call.  We will assume that you have returned to your regular daily activities without incident.  If any biopsies were taken you will be contacted by phone or by letter within the next 1-3 weeks.  Please call us at [phone] if you have not heard about the biopsies in 3 weeks.    SIGNATURES/CONFIDENTIALITY: You and/or your care partner have signed paperwork which will be entered into your electronic medical record.  These signatures attest to the fact that that the information above on your After Visit Summary has been reviewed and is understood.  Full responsibility of the confidentiality of this discharge information lies with you and/or your care-partner.   INFORMATION ON POLYPS AND HEMORRHOIDS GIVEN TO YOU TODAY  AWAIT PATHOLOGY RESULTS ON POLYPS REMOVED  RESUME PREVIOUS DIET AND MEDICATIONS

## 2016-11-10 NOTE — Progress Notes (Signed)
Called to room to assist during endoscopic procedure.  Patient ID and intended procedure confirmed with present staff. Received instructions for my participation in the procedure from the performing physician.  

## 2016-11-10 NOTE — Progress Notes (Signed)
To recovery, report to [place], RN, VSS 

## 2016-11-11 ENCOUNTER — Telehealth: Payer: Self-pay

## 2016-11-11 NOTE — Telephone Encounter (Signed)
  Follow up Call-  Call back number 11/10/2016  Post procedure Call Back phone  # [phone]  Permission to leave phone message Yes  Some recent data might be hidden     Patient questions:  Do you have a fever, pain , or abdominal swelling? No. Pain Score  0 *  Have you tolerated food without any problems? Yes.    Have you been able to return to your normal activities? Yes.    Do you have any questions about your discharge instructions: Diet   Yes.   Medications  No. Follow up visit  No.  Do you have questions or concerns about your Care? No.  Actions: * If pain score is 4 or above: No action needed, pain <4.

## 2016-11-14 ENCOUNTER — Encounter: Payer: Self-pay | Admitting: Internal Medicine

## 2016-11-21 ENCOUNTER — Ambulatory Visit
Admission: RE | Admit: 2016-11-21 | Discharge: 2016-11-21 | Disposition: A | Discharge status: Home / Self Care | Payer: Medicare Other | Source: Ambulatory Visit | Attending: Internal Medicine | Admitting: Internal Medicine

## 2016-11-21 DIAGNOSIS — Z1231 Encounter for screening mammogram for malignant neoplasm of breast: Secondary | ICD-10-CM

## 2016-12-18 ENCOUNTER — Ambulatory Visit (INDEPENDENT_AMBULATORY_CARE_PROVIDER_SITE_OTHER): Payer: Medicare Other | Admitting: Podiatry

## 2016-12-18 ENCOUNTER — Encounter: Payer: Self-pay | Admitting: Podiatry

## 2016-12-18 VITALS — BP 123/72 | HR 69 | Resp 16 | Ht 62.0 in | Wt 148.0 lb

## 2016-12-18 DIAGNOSIS — L600 Ingrowing nail: Secondary | ICD-10-CM

## 2016-12-18 NOTE — Progress Notes (Signed)
   Subjective:    Patient ID: Meagan Key, female    DOB: 1946-02-22, 71 y.o.   MRN: [id number]  HPI Chief Complaint  Patient presents with  . Nail Problem    Left foot; great toe; pt stated, "has an ingrown toenail - needs to be checked"      Review of Systems  All other systems reviewed and are negative.      Objective:   Physical Exam        Assessment & Plan:

## 2016-12-18 NOTE — Patient Instructions (Signed)

## 2016-12-19 NOTE — Progress Notes (Signed)
Subjective:     Patient ID: IVONA COOKSEY, female   DOB: 1946/08/16, 71 y.o.   MRN: [id number]  HPI patient presents with chronic ingrown toenail the left hallux and states that she's tried to trim it and soak it without relief and it's been going on for several months   Review of Systems  All other systems reviewed and are negative.      Objective:   Physical Exam  Constitutional: She is oriented to person, place, and time.  Cardiovascular: Intact distal pulses.   Musculoskeletal: Normal range of motion.  Neurological: She is oriented to person, place, and time.  Skin: Skin is warm.  Nursing note and vitals reviewed.  neurovascular status intact muscle strength was adequate with incurvated left hallux border lateral that is painful when pressed with distal redness but no active drainage noted. Patient's found have good digital perfusion and is well oriented 3     Assessment:     Ingrown toenail deformity left hallux border    Plan:     H&P condition reviewed and recommended correction of deformity. Explained procedure and risk and today I infiltrated the left hallux 60 mg like Marcaine mixture remove the border exposed matrix and applied phenol 3 applications 30 seconds followed by alcohol lavage and sterile dressing. Gave instructions on soaks and reappoint

## 2016-12-23 DIAGNOSIS — E559 Vitamin D deficiency, unspecified: Secondary | ICD-10-CM | POA: Diagnosis not present

## 2016-12-23 DIAGNOSIS — R8299 Other abnormal findings in urine: Secondary | ICD-10-CM | POA: Diagnosis not present

## 2016-12-23 DIAGNOSIS — R809 Proteinuria, unspecified: Secondary | ICD-10-CM | POA: Insufficient documentation

## 2016-12-23 DIAGNOSIS — N3941 Urge incontinence: Secondary | ICD-10-CM | POA: Diagnosis not present

## 2016-12-30 DIAGNOSIS — D472 Monoclonal gammopathy: Secondary | ICD-10-CM | POA: Insufficient documentation

## 2017-01-01 DIAGNOSIS — R8299 Other abnormal findings in urine: Secondary | ICD-10-CM | POA: Diagnosis not present

## 2017-01-01 DIAGNOSIS — N3941 Urge incontinence: Secondary | ICD-10-CM | POA: Diagnosis not present

## 2017-01-29 DIAGNOSIS — R300 Dysuria: Secondary | ICD-10-CM | POA: Diagnosis not present

## 2017-01-29 DIAGNOSIS — R8299 Other abnormal findings in urine: Secondary | ICD-10-CM | POA: Diagnosis not present

## 2017-03-31 DIAGNOSIS — E048 Other specified nontoxic goiter: Secondary | ICD-10-CM | POA: Diagnosis not present

## 2017-03-31 DIAGNOSIS — I10 Essential (primary) hypertension: Secondary | ICD-10-CM | POA: Diagnosis not present

## 2017-03-31 DIAGNOSIS — E559 Vitamin D deficiency, unspecified: Secondary | ICD-10-CM | POA: Diagnosis not present

## 2017-03-31 DIAGNOSIS — E784 Other hyperlipidemia: Secondary | ICD-10-CM | POA: Diagnosis not present

## 2017-03-31 DIAGNOSIS — R358 Other polyuria: Secondary | ICD-10-CM | POA: Diagnosis not present

## 2017-04-06 DIAGNOSIS — H268 Other specified cataract: Secondary | ICD-10-CM | POA: Diagnosis not present

## 2017-04-06 DIAGNOSIS — H40023 Open angle with borderline findings, high risk, bilateral: Secondary | ICD-10-CM | POA: Diagnosis not present

## 2017-04-07 DIAGNOSIS — H401490 Capsular glaucoma with pseudoexfoliation of lens, unspecified eye, stage unspecified: Secondary | ICD-10-CM | POA: Diagnosis not present

## 2017-04-07 DIAGNOSIS — G4733 Obstructive sleep apnea (adult) (pediatric): Secondary | ICD-10-CM | POA: Diagnosis not present

## 2017-04-07 DIAGNOSIS — N3941 Urge incontinence: Secondary | ICD-10-CM | POA: Diagnosis not present

## 2017-04-07 DIAGNOSIS — D472 Monoclonal gammopathy: Secondary | ICD-10-CM | POA: Diagnosis not present

## 2017-04-07 DIAGNOSIS — Z0000 Encounter for general adult medical examination without abnormal findings: Secondary | ICD-10-CM | POA: Diagnosis not present

## 2017-04-07 DIAGNOSIS — R300 Dysuria: Secondary | ICD-10-CM | POA: Diagnosis not present

## 2017-04-07 DIAGNOSIS — Z1389 Encounter for screening for other disorder: Secondary | ICD-10-CM | POA: Diagnosis not present

## 2017-04-07 DIAGNOSIS — M1990 Unspecified osteoarthritis, unspecified site: Secondary | ICD-10-CM | POA: Diagnosis not present

## 2017-04-07 DIAGNOSIS — R808 Other proteinuria: Secondary | ICD-10-CM | POA: Diagnosis not present

## 2017-04-07 DIAGNOSIS — Z6827 Body mass index (BMI) 27.0-27.9, adult: Secondary | ICD-10-CM | POA: Diagnosis not present

## 2017-04-07 DIAGNOSIS — E559 Vitamin D deficiency, unspecified: Secondary | ICD-10-CM | POA: Diagnosis not present

## 2017-04-07 DIAGNOSIS — K219 Gastro-esophageal reflux disease without esophagitis: Secondary | ICD-10-CM | POA: Diagnosis not present

## 2017-04-08 DIAGNOSIS — Z1212 Encounter for screening for malignant neoplasm of rectum: Secondary | ICD-10-CM | POA: Diagnosis not present

## 2017-04-28 ENCOUNTER — Telehealth: Payer: Self-pay | Admitting: *Deleted

## 2017-04-28 NOTE — Telephone Encounter (Signed)
Pt called c/o what she thought would be vaginal bleeding, on 04/26/17,no bleeding now, per patient. I advised pt to schedule OV with provider for exam, this message was left on pt voicemail

## 2017-04-29 ENCOUNTER — Encounter: Payer: Self-pay | Admitting: Gynecology

## 2017-04-29 ENCOUNTER — Ambulatory Visit (INDEPENDENT_AMBULATORY_CARE_PROVIDER_SITE_OTHER): Payer: Medicare Other | Admitting: Gynecology

## 2017-04-29 VITALS — BP 122/76

## 2017-04-29 DIAGNOSIS — N950 Postmenopausal bleeding: Secondary | ICD-10-CM | POA: Diagnosis not present

## 2017-04-29 NOTE — Progress Notes (Signed)
    Meagan Key 1946-04-08 [id number]        71 y.o.  G2P2002 presents reporting several episodes of bleeding of the past year. These all seem to of followed some form of activity such as exercise. She's noticed bleeding on her clothes. Having a little bit of cramping. Reports recent negative colonoscopy this year. Past history of endometrial polyp status post resection 2015.  Past medical history,surgical history, problem list, medications, allergies, family history and social history were all reviewed and documented in the EPIC chart.  Directed ROS with pertinent positives and negatives documented in the history of present illness/assessment and plan.  Exam: [name] assistant Vitals:   04/29/17 0933  BP: 122/76   General appearance:  Normal Abdomen soft nontender without masses guarding rebound. Pelvic external BUS vagina with atrophic changes. Cervix with atrophic changes. Uterus normal size midline mobile nontender. Adnexa without masses or tenderness. Rectal exam is normal. Several small old external hemorrhoids noted  Assessment/Plan:  71 y.o. G2P2002 with several episodes over the past year of staining her clothes with blood. Recent colonoscopy normal. Recent stool check at her primary physician's negative for blood. History of endometrial polyps in the past. Exam today is normal. Recommend sonohysterogram for endometrial assessment. Differential to include atrophic changes, endometrial polyps or other cavitary abnormalities, endometrial cancer all reviewed. Patient will schedule and follow up for the sonohysterogram.    [name] MD, 9:54 AM 04/29/2017

## 2017-04-29 NOTE — Patient Instructions (Signed)
Follow up for ultrasound as scheduled 

## 2017-05-13 ENCOUNTER — Other Ambulatory Visit: Payer: Self-pay | Admitting: Gynecology

## 2017-05-13 DIAGNOSIS — N950 Postmenopausal bleeding: Secondary | ICD-10-CM

## 2017-05-25 ENCOUNTER — Encounter: Payer: Self-pay | Admitting: Gynecology

## 2017-05-25 ENCOUNTER — Ambulatory Visit (INDEPENDENT_AMBULATORY_CARE_PROVIDER_SITE_OTHER): Payer: Medicare Other

## 2017-05-25 ENCOUNTER — Ambulatory Visit (INDEPENDENT_AMBULATORY_CARE_PROVIDER_SITE_OTHER): Payer: Medicare Other | Admitting: Gynecology

## 2017-05-25 VITALS — BP 120/76

## 2017-05-25 DIAGNOSIS — N858 Other specified noninflammatory disorders of uterus: Secondary | ICD-10-CM | POA: Diagnosis not present

## 2017-05-25 DIAGNOSIS — N950 Postmenopausal bleeding: Secondary | ICD-10-CM

## 2017-05-25 NOTE — Progress Notes (Signed)
    Meagan Key 1946-06-30 [id number]        71 y.o.  G2P2002 presents for sonohysterogram. Patient has had several episodes of vaginal bleeding of the past year related to exercise. Had a little bit of cramping with this but no significant pain. Recent negative colonoscopy. Does have a history of endometrial polyps resected 2015.  Past medical history,surgical history, problem list, medications, allergies, family history and social history were all reviewed and documented in the EPIC chart.  Directed ROS with pertinent positives and negatives documented in the history of present illness/assessment and plan.  Exam: BP: 120/76 [place] assistant General appearance:  Normal Abdomen soft nontender without masses guarding rebound Pelvic external BUS vagina with atrophic changes. Cervix with atrophic changes. Uterus normal size midline mobile nontender. Adnexa without masses or tenderness.  Ultrasound:  Transvaginal and transabdominal. Uterus normal size and echotexture. Endometrial echo 3.3 mm. Left ovary normal. Right ovary absent consistent with history of RSO. Right adnexa negative. Cul-de-sac negative.  Sonohysterogram performed, sterile technique, easy catheter introduction, good distention with no abnormalities seen. Endometrial sample taken with scant return. Patient tolerated well  Assessment/Plan:  71 y.o. [id number] with history of intermittent staining after exercise. Ultrasound shows thin endometrium. Sonohysterogram without evidence of polyps or other pathology. Scant return noted on biopsy consistent with thin endometrium. Reviewed with the patient and her husband biopsy results may be inadequate which in her particular case would be appropriate given the atrophic appearance. At this point we'll plan expectant management. If spotting would continue I reviewed possible long-term options.    [name] MD, 10:06 AM 05/25/2017

## 2017-05-25 NOTE — Patient Instructions (Signed)
Office will call you with biopsy results 

## 2017-06-05 DIAGNOSIS — R05 Cough: Secondary | ICD-10-CM | POA: Diagnosis not present

## 2017-06-05 DIAGNOSIS — Z6826 Body mass index (BMI) 26.0-26.9, adult: Secondary | ICD-10-CM | POA: Diagnosis not present

## 2017-06-05 DIAGNOSIS — J069 Acute upper respiratory infection, unspecified: Secondary | ICD-10-CM | POA: Diagnosis not present

## 2017-06-05 DIAGNOSIS — J029 Acute pharyngitis, unspecified: Secondary | ICD-10-CM | POA: Diagnosis not present

## 2017-06-08 DIAGNOSIS — R05 Cough: Secondary | ICD-10-CM | POA: Diagnosis not present

## 2017-07-21 DIAGNOSIS — M859 Disorder of bone density and structure, unspecified: Secondary | ICD-10-CM | POA: Diagnosis not present

## 2017-07-27 DIAGNOSIS — R42 Dizziness and giddiness: Secondary | ICD-10-CM | POA: Insufficient documentation

## 2017-07-27 DIAGNOSIS — H6120 Impacted cerumen, unspecified ear: Secondary | ICD-10-CM | POA: Insufficient documentation

## 2017-07-27 DIAGNOSIS — H6591 Unspecified nonsuppurative otitis media, right ear: Secondary | ICD-10-CM | POA: Diagnosis not present

## 2017-07-27 DIAGNOSIS — Z6827 Body mass index (BMI) 27.0-27.9, adult: Secondary | ICD-10-CM | POA: Diagnosis not present

## 2017-07-27 DIAGNOSIS — H6121 Impacted cerumen, right ear: Secondary | ICD-10-CM | POA: Diagnosis not present

## 2017-09-08 DIAGNOSIS — R300 Dysuria: Secondary | ICD-10-CM | POA: Diagnosis not present

## 2017-09-08 DIAGNOSIS — R8290 Unspecified abnormal findings in urine: Secondary | ICD-10-CM | POA: Diagnosis not present

## 2017-09-08 DIAGNOSIS — N390 Urinary tract infection, site not specified: Secondary | ICD-10-CM | POA: Diagnosis not present

## 2017-09-16 ENCOUNTER — Encounter: Payer: Self-pay | Admitting: Gynecology

## 2017-09-16 ENCOUNTER — Ambulatory Visit (INDEPENDENT_AMBULATORY_CARE_PROVIDER_SITE_OTHER): Payer: Medicare Other | Admitting: Gynecology

## 2017-09-16 ENCOUNTER — Telehealth: Payer: Self-pay | Admitting: *Deleted

## 2017-09-16 VITALS — BP 120/78 | Ht 62.0 in | Wt 148.0 lb

## 2017-09-16 DIAGNOSIS — Z01411 Encounter for gynecological examination (general) (routine) with abnormal findings: Secondary | ICD-10-CM | POA: Diagnosis not present

## 2017-09-16 DIAGNOSIS — R32 Unspecified urinary incontinence: Secondary | ICD-10-CM

## 2017-09-16 DIAGNOSIS — N950 Postmenopausal bleeding: Secondary | ICD-10-CM

## 2017-09-16 DIAGNOSIS — Z23 Encounter for immunization: Secondary | ICD-10-CM

## 2017-09-16 DIAGNOSIS — M8589 Other specified disorders of bone density and structure, multiple sites: Secondary | ICD-10-CM | POA: Diagnosis not present

## 2017-09-16 DIAGNOSIS — N952 Postmenopausal atrophic vaginitis: Secondary | ICD-10-CM | POA: Diagnosis not present

## 2017-09-16 DIAGNOSIS — M8580 Other specified disorders of bone density and structure, unspecified site: Secondary | ICD-10-CM

## 2017-09-16 MED ORDER — NONFORMULARY OR COMPOUNDED ITEM: 3 refills | Status: DC

## 2017-09-16 NOTE — Telephone Encounter (Signed)
-----   Message from [name], MD sent at 09/16/2017 12:02 PM EST ----- [facility] vaginal estradiol cream syringes twice weekly 27-month supply refill times 1 year

## 2017-09-16 NOTE — Progress Notes (Signed)
Meagan Key [birth year] [id number]        71 y.o.  [id number] for breast and pelvic exam.  Patient also complaining of significant vaginal dryness and discomfort.  Has tried Replens in the past but does not seem to be helping..  Also had an episode of bleeding several weeks ago after exercising.  Recently had sonohysterogram in August due to bleeding after exercise which showed a thin endometrial echo at 3 mm and a biopsy showing atrophic strips of endometrial tissue.  Past medical history,surgical history, problem list, medications, allergies, family history and social history were all reviewed and documented as reviewed in the EPIC chart.  ROS:  Performed with pertinent positives and negatives included in the history, assessment and plan.   Additional significant findings : None   Exam: [name] assistant Vitals:   09/16/17 1133  BP: 120/78  Weight: 148 lb (67.1 kg)  Height: 5\' 2"  (1.575 m)   Body mass index is 27.07 kg/m.  General appearance:  Normal affect, orientation and appearance. Skin: Grossly normal HEENT: Without gross lesions.  No cervical or supraclavicular adenopathy. Thyroid normal.  Lungs:  Clear without wheezing, rales or rhonchi Cardiac: RR, without RMG Abdominal:  Soft, nontender, without masses, guarding, rebound, organomegaly or hernia Breasts:  Examined lying and sitting without masses, retractions, discharge or axillary adenopathy. Pelvic:  Ext, BUS, Vagina: With atrophic changes  Cervix: With atrophic changes  Uterus: Anteverted, normal size, shape and contour, midline and mobile nontender   Adnexa: Without masses or tenderness    Anus and perineum: Normal   Rectovaginal: Normal sphincter tone without palpated masses or tenderness.    Assessment/Plan:  71 y.o. [id number] female for breast and pelvic exam.   1. Postmenopausal/atrophic genital changes.  Patient having significant atrophic symptoms to include irritation and dyspareunia.  Tried Replens  but does not seem to be helping.  Had used vaginal estrogen years ago.  I again reviewed the options to include vaginal estrogen to include cream, Vagifem, rings, or Osphena.  Issues of absorption with systemic effects to include thrombosis, endometrial stimulation in the breast issues discussed.  At this point the patient wants to go ahead and start on vaginal estrogen.  We will go with formulated estradiol vaginal cream twice weekly.  Patient will call if she has any issues or any bleeding. 2. Urinary incontinence.  Patient having some issues with urgency type incontinence feeling like she has to go and then cannot make it to the bathroom.  Seems worse after coffee.  No stress symptoms with coughing laughing sneezing.  Currently on ciprofloxacin for UTI.  Does note several episodes of UTI this past year treated elsewhere.  Discussed various options to include OAB medications as well as starting on her vaginal estrogen and see if this does not help with her symptoms.  Ultimate referral to urology also possible.  At this point she will start the estrogen and will see how she does.  We will follow-up with her primary physician that she has an appointment scheduled to recheck her urine analysis. 3. Mammography 11/2016.  Continue with annual mammography when due.  Breast exam normal today. 4. DEXA 2018.  Being followed by Dr. Virgina Jock for her osteopenia.  She will continue to follow-up with him in reference to bone health. 5. Colonoscopy 2018.  Repeat at their recommended interval. 6. Pap smear 2017.  No Pap smear done today.  No history of significant abnormal Pap smears.  Options to stop screening per current  screening guidelines and age reviewed versus continue at every 3-year interval discussed.  Will readdress on an annual basis. 7. Health maintenance.  No routine lab work done as patient does this elsewhere.  Follow-up if any issues after starting vaginal estrogen.  Follow-up in 1 year for annual  exam.  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

## 2017-09-16 NOTE — Patient Instructions (Signed)
Chart on the vaginal estrogen twice weekly as we discussed.  Call if you have any issues.  Follow-up in 1 year for annual exam.

## 2017-09-16 NOTE — Telephone Encounter (Signed)
[facility] 

## 2017-09-24 ENCOUNTER — Other Ambulatory Visit: Payer: Self-pay | Admitting: Internal Medicine

## 2017-09-24 ENCOUNTER — Ambulatory Visit
Admission: RE | Admit: 2017-09-24 | Discharge: 2017-09-24 | Disposition: A | Discharge status: Home / Self Care | Payer: Medicare Other | Source: Ambulatory Visit | Attending: Internal Medicine | Admitting: Internal Medicine

## 2017-09-24 DIAGNOSIS — R42 Dizziness and giddiness: Secondary | ICD-10-CM

## 2017-09-24 DIAGNOSIS — I77819 Aortic ectasia, unspecified site: Secondary | ICD-10-CM | POA: Insufficient documentation

## 2017-09-24 DIAGNOSIS — H401490 Capsular glaucoma with pseudoexfoliation of lens, unspecified eye, stage unspecified: Secondary | ICD-10-CM | POA: Diagnosis not present

## 2017-09-24 DIAGNOSIS — N952 Postmenopausal atrophic vaginitis: Secondary | ICD-10-CM | POA: Diagnosis not present

## 2017-09-24 DIAGNOSIS — Z6827 Body mass index (BMI) 27.0-27.9, adult: Secondary | ICD-10-CM | POA: Diagnosis not present

## 2017-09-24 DIAGNOSIS — M859 Disorder of bone density and structure, unspecified: Secondary | ICD-10-CM | POA: Diagnosis not present

## 2017-09-24 DIAGNOSIS — D472 Monoclonal gammopathy: Secondary | ICD-10-CM | POA: Diagnosis not present

## 2017-09-24 DIAGNOSIS — I10 Essential (primary) hypertension: Secondary | ICD-10-CM | POA: Diagnosis not present

## 2017-09-29 ENCOUNTER — Other Ambulatory Visit: Payer: Self-pay | Admitting: Internal Medicine

## 2017-09-29 DIAGNOSIS — R42 Dizziness and giddiness: Secondary | ICD-10-CM

## 2017-10-05 DIAGNOSIS — Z6827 Body mass index (BMI) 27.0-27.9, adult: Secondary | ICD-10-CM | POA: Diagnosis not present

## 2017-10-05 DIAGNOSIS — H40023 Open angle with borderline findings, high risk, bilateral: Secondary | ICD-10-CM | POA: Diagnosis not present

## 2017-10-05 DIAGNOSIS — I10 Essential (primary) hypertension: Secondary | ICD-10-CM | POA: Diagnosis not present

## 2017-10-05 DIAGNOSIS — H268 Other specified cataract: Secondary | ICD-10-CM | POA: Diagnosis not present

## 2017-10-05 DIAGNOSIS — N952 Postmenopausal atrophic vaginitis: Secondary | ICD-10-CM | POA: Diagnosis not present

## 2017-10-05 DIAGNOSIS — H401490 Capsular glaucoma with pseudoexfoliation of lens, unspecified eye, stage unspecified: Secondary | ICD-10-CM | POA: Diagnosis not present

## 2017-10-05 DIAGNOSIS — D472 Monoclonal gammopathy: Secondary | ICD-10-CM | POA: Diagnosis not present

## 2017-10-05 DIAGNOSIS — M859 Disorder of bone density and structure, unspecified: Secondary | ICD-10-CM | POA: Diagnosis not present

## 2017-10-05 DIAGNOSIS — R42 Dizziness and giddiness: Secondary | ICD-10-CM | POA: Diagnosis not present

## 2017-10-05 DIAGNOSIS — I77819 Aortic ectasia, unspecified site: Secondary | ICD-10-CM | POA: Diagnosis not present

## 2017-10-19 ENCOUNTER — Other Ambulatory Visit: Payer: Self-pay | Admitting: Internal Medicine

## 2017-10-19 DIAGNOSIS — Z139 Encounter for screening, unspecified: Secondary | ICD-10-CM

## 2017-11-23 ENCOUNTER — Ambulatory Visit
Admission: RE | Admit: 2017-11-23 | Discharge: 2017-11-23 | Disposition: A | Discharge status: Home / Self Care | Payer: Medicare Other | Source: Ambulatory Visit | Attending: Internal Medicine | Admitting: Internal Medicine

## 2017-11-23 DIAGNOSIS — Z1231 Encounter for screening mammogram for malignant neoplasm of breast: Secondary | ICD-10-CM | POA: Diagnosis not present

## 2017-11-23 DIAGNOSIS — Z139 Encounter for screening, unspecified: Secondary | ICD-10-CM

## 2018-06-11 DIAGNOSIS — R300 Dysuria: Secondary | ICD-10-CM | POA: Diagnosis not present

## 2018-06-28 DIAGNOSIS — M5412 Radiculopathy, cervical region: Secondary | ICD-10-CM | POA: Diagnosis not present

## 2018-06-28 DIAGNOSIS — I10 Essential (primary) hypertension: Secondary | ICD-10-CM | POA: Diagnosis not present

## 2018-06-28 DIAGNOSIS — M25511 Pain in right shoulder: Secondary | ICD-10-CM | POA: Diagnosis not present

## 2018-06-28 DIAGNOSIS — M542 Cervicalgia: Secondary | ICD-10-CM | POA: Diagnosis not present

## 2018-06-28 DIAGNOSIS — W19XXXA Unspecified fall, initial encounter: Secondary | ICD-10-CM | POA: Diagnosis not present

## 2018-06-28 DIAGNOSIS — Z6827 Body mass index (BMI) 27.0-27.9, adult: Secondary | ICD-10-CM | POA: Diagnosis not present

## 2018-06-29 ENCOUNTER — Other Ambulatory Visit: Payer: Self-pay

## 2018-06-29 ENCOUNTER — Emergency Department (HOSPITAL_COMMUNITY): Payer: Medicare Other

## 2018-06-29 ENCOUNTER — Emergency Department (HOSPITAL_COMMUNITY)
Admission: EM | Admit: 2018-06-29 | Discharge: 2018-06-30 | Disposition: A | Discharge status: Home / Self Care | Payer: Medicare Other | Attending: Emergency Medicine | Admitting: Emergency Medicine

## 2018-06-29 ENCOUNTER — Encounter (HOSPITAL_COMMUNITY): Payer: Self-pay | Admitting: Emergency Medicine

## 2018-06-29 DIAGNOSIS — E079 Disorder of thyroid, unspecified: Secondary | ICD-10-CM | POA: Diagnosis not present

## 2018-06-29 DIAGNOSIS — J45909 Unspecified asthma, uncomplicated: Secondary | ICD-10-CM | POA: Diagnosis not present

## 2018-06-29 DIAGNOSIS — Z79899 Other long term (current) drug therapy: Secondary | ICD-10-CM | POA: Diagnosis not present

## 2018-06-29 DIAGNOSIS — Z87891 Personal history of nicotine dependence: Secondary | ICD-10-CM | POA: Diagnosis not present

## 2018-06-29 DIAGNOSIS — R079 Chest pain, unspecified: Secondary | ICD-10-CM | POA: Diagnosis not present

## 2018-06-29 DIAGNOSIS — I10 Essential (primary) hypertension: Secondary | ICD-10-CM | POA: Diagnosis not present

## 2018-06-29 DIAGNOSIS — I480 Paroxysmal atrial fibrillation: Secondary | ICD-10-CM | POA: Diagnosis not present

## 2018-06-29 LAB — BASIC METABOLIC PANEL
Anion gap: 11 (ref 5–15)
BUN: 25 mg/dL — ABNORMAL HIGH (ref 8–23)
CHLORIDE: 104 mmol/L (ref 98–111)
CO2: 23 mmol/L (ref 22–32)
Calcium: 9.5 mg/dL (ref 8.9–10.3)
Creatinine, Ser: 1.1 mg/dL — ABNORMAL HIGH (ref 0.44–1.00)
GFR calc non Af Amer: 48 mL/min — ABNORMAL LOW (ref >60)
GFR, EST AFRICAN AMERICAN: 56 mL/min — AB (ref >60)
Glucose, Bld: 205 mg/dL — ABNORMAL HIGH (ref 70–99)
Potassium: 4.3 mmol/L (ref 3.5–5.1)
SODIUM: 138 mmol/L (ref 135–145)

## 2018-06-29 LAB — CBC
HEMATOCRIT: 39.8 % (ref 36.0–46.0)
Hemoglobin: 13.4 g/dL (ref 12.0–15.0)
MCH: 29.7 pg (ref 26.0–34.0)
MCHC: 33.7 g/dL (ref 30.0–36.0)
MCV: 88.2 fL (ref 78.0–100.0)
Platelets: 251 10*3/uL (ref 150–400)
RBC: 4.51 MIL/uL (ref 3.87–5.11)
RDW: 14 % (ref 11.5–15.5)
WBC: 11 10*3/uL — AB (ref 4.0–10.5)

## 2018-06-29 LAB — TSH: TSH: 1.257 u[IU]/mL (ref 0.350–4.500)

## 2018-06-29 LAB — I-STAT TROPONIN, ED: Troponin i, poc: 0.01 ng/mL (ref 0.00–0.08)

## 2018-06-29 NOTE — ED Provider Notes (Signed)
I saw and evaluated the patient, reviewed the resident's note and I agree with the findings and plan.  EKG: None    ED ECG REPORT   Date: 06/29/2018  Rate: 149  Rhythm: atrial fibrillation  QRS Axis: normal  Intervals: normal  ST/T Wave abnormalities: nonspecific ST changes  Conduction Disutrbances:none  Narrative Interpretation:   Old EKG Reviewed: none available  I have personally reviewed the EKG tracing and agree with the computerized printout as noted. 71 year old female with acute onset of palpitations and chest discomfort which occurred today at rest.  Patient felt her heart rate was irregular and fast.  Pain was not associated with dyspnea or diaphoresis.  Patient has spontaneous resolve of her A. fib.  She is currently pain-free at this time.  Will consult cardiology   Lacretia Leigh, MD 06/29/18 2334

## 2018-06-29 NOTE — ED Triage Notes (Signed)
Pt reports generalized cp that radiates to her neck that started around 2040 tonight. Pt reports that she felt her pulse and it seemed to be jumpy and fast. Denies hx of Afib.

## 2018-06-29 NOTE — ED Provider Notes (Signed)
[facility] EMERGENCY DEPARTMENT Provider Note   CSN: [id number] Arrival date & time: 06/29/18  2117     History   Chief Complaint Chief Complaint  Patient presents with  . Chest Pain    HPI Meagan Key is a 72 y.o. female.  The history is provided by the patient.  Chest Pain   This is a new problem. The current episode started 1 to 2 hours ago. The problem occurs constantly. The problem has been resolved. The pain is associated with rest. The pain is present in the substernal region. The pain is severe. The quality of the pain is described as pressure-like. The pain radiates to the left jaw and right jaw. Associated symptoms include nausea. Pertinent negatives include no abdominal pain, no back pain, no cough, no diaphoresis, no fever, no palpitations, no shortness of breath and no vomiting. She has tried nothing for the symptoms.  Her past medical history is significant for hyperlipidemia, hypertension and thyroid problem.  Pertinent negatives for past medical history include no CAD, no COPD, no diabetes and no MI.  Procedure history is negative for cardiac catheterization.    Past Medical History:  Diagnosis Date  . Allergy   . Arthritis    hands  . Asthma    rarely uses inhaler  . Cataracts, bilateral   . Chicken pox   . Endometriosis   . GERD (gastroesophageal reflux disease)   . Glaucoma    surg for preventive for NARROW ANGLE  . Hematuria   . Hx of colonic polyp 06-12-2003   Colonoscopy Dr Amedeo Plenty  . Hyperlipemia   . Hypertension   . Hypertension   . Osteopenia 05/2013   T score -2.3 FRAX 13%/2.7%  . Sleep apnea   . Thyroid disease    nodule on thyroid    Patient Active Problem List   Diagnosis Date Noted  . Pseudophakia of right eye 02/27/2016  . Pseudophakia of left eye 02/06/2016  . 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 Obstructive sleep apnea 11/19/2012  . Dysphonia  11/19/2012  . Thyroid nodule 09/06/2012  . Osteopenia   . Hypertension   . Glaucoma   . HYPERLIPIDEMIA 12/09/2009  . HYPERTENSION 12/09/2009  . Unspecified asthma(493.90) 11/30/2009    Past Surgical History:  Procedure Laterality Date  . APPENDECTOMY    . BREAST CYST EXCISION  AGE 72, 23, 75   bilateral  . BREAST EXCISIONAL BIOPSY Bilateral   . CATARACT EXTRACTION    . CATARACT EXTRACTION     x2  . COLONOSCOPY    . DILATATION & CURETTAGE/HYSTEROSCOPY WITH TRUECLEAR N/A 02/22/2014   Procedure: DILATATION & CURETTAGE/HYSTEROSCOPY WITH TRUCLEAR;  Surgeon: [name], MD;  Location: [facility];  Service: Gynecology;  Laterality: N/A;  . ESOPHAGEAL MANOMETRY N/A 11/29/2012   Procedure: ESOPHAGEAL MANOMETRY (EM);  Surgeon: Sable Feil, MD;  Location: [facility];  Service: Endoscopy;  Laterality: N/A;  . 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 LAPAROSCOPIC ENDOMETRIOSIS FULGURATION  1972  . OOPHORECTOMY  1969   RSO  . POLYPECTOMY    . THYROIDECTOMY, PARTIAL  AGE 4  . TONSILLECTOMY    . UPPER GASTROINTESTINAL ENDOSCOPY  2013     OB History    Gravida  2   Para  2   Term  2   Preterm      AB  Living  2     SAB      TAB      Ectopic      Multiple      Live Births               Home Medications    Prior to Admission medications   Medication Sig Start Date End Date Taking? Authorizing Provider  acetaminophen (TYLENOL) 325 MG tablet Take 650 mg by mouth every 6 (six) hours as needed.    [provider]  Albuterol Sulfate (PROAIR HFA IN) Inhale into the lungs.    [provider]  AMLODIPINE BESYLATE PO Take 5 mg by mouth daily.     [provider]  Calcium Citrate (CITRACAL PO) Take 2 tablets by mouth daily. 500 mg Calcium & 1000 units of Vitamin D    [provider]  chlorpheniramine (CHLOR-TRIMETON) 4 MG tablet Take 4 mg by mouth every 6 (six) hours as needed for allergies.     [provider]  esomeprazole (NEXIUM) 20 MG capsule Take 20 mg by mouth daily at 12 noon. 20 mg in am, 20-40 mg in pm if needed    [provider]  ibuprofen (ADVIL,MOTRIN) 200 MG tablet Take 200 mg by mouth every 6 (six) hours as needed for mild pain.    [provider]  metoprolol succinate (TOPROL-XL) 50 MG 24 hr tablet Take 50 mg by mouth daily. 03/10/11   Romeo Apple, MD  NONFORMULARY OR COMPOUNDED ITEM Estradiol vaginal cream 0.02% insert twice weekly 09/16/17   Fontaine, Belinda Block, MD  pravastatin (PRAVACHOL) 10 MG tablet Take 10 mg by mouth daily.    [provider]  Calcium-Vitamin D-Vitamin K (VIACTIV PO) Take by mouth daily.    12/27/11  [provider]    Family History Family History  Problem Relation Age of Onset  . Colon cancer Mother 64  . Alcohol abuse Mother   . Mental illness Mother   . Lung cancer Father        smoker  . Alcohol abuse Father   . Hypertension Father   . Colon cancer Father 70  . Breast cancer Maternal Aunt        Age 107  . Heart attack Paternal Grandmother   . Other Cousin        mother side-vocal cord cancer    Social History Social History   Tobacco Use  . Smoking status: Former Smoker    Packs/day: 0.30    Years: 25.00    Pack years: 7.50    Types: Cigarettes    Last attempt to quit: 09/14/1984    Years since quitting: 33.8  . Smokeless tobacco: Never Used  Substance Use Topics  . Alcohol use: Yes    Alcohol/week: 4.0 standard drinks    Types: 1 Glasses of wine, 3 Standard drinks or equivalent per week    Comment: few drinks/wk  . Drug use: No     Allergies   Nitrofurantoin and Lisinopril   Review of Systems Review of Systems  Constitutional: Negative for diaphoresis and fever.  HENT: Negative for sore throat.   Eyes: Negative for pain.  Respiratory: Negative for cough and shortness of breath.   Cardiovascular: Positive for chest pain. Negative for palpitations.    Gastrointestinal: Positive for nausea. Negative for abdominal pain and vomiting.  Genitourinary: Negative for dysuria.  Musculoskeletal: Negative for back pain.  Skin: Negative for rash.  Neurological: Negative for syncope.  All other  systems reviewed and are negative.    Physical Exam Updated Vital Signs BP (!) 113/52   Pulse (!) 59   Temp 97.6 F (36.4 C) (Oral)   Resp (!) 22   Ht 5\' 2"  (1.575 m)   Wt 68 kg   SpO2 98%   BMI 27.44 kg/m   Physical Exam  Constitutional: She appears well-developed and well-nourished. No distress.  HENT:  Head: Normocephalic and atraumatic.  Eyes: Conjunctivae are normal.  Neck: Neck supple.  Cardiovascular: Normal rate and regular rhythm.  No murmur heard. Pulmonary/Chest: Effort normal and breath sounds normal. No respiratory distress.  Abdominal: Soft. There is no tenderness.  Musculoskeletal: She exhibits no edema.  Neurological: She is alert.  Skin: Skin is warm and dry.  Psychiatric: She has a normal mood and affect.  Nursing note and vitals reviewed.    ED Treatments / Results  Labs (all labs ordered are listed, but only abnormal results are displayed) Labs Reviewed  BASIC METABOLIC PANEL - Abnormal; Notable for the following components:      Result Value   Glucose, Bld 205 (*)    BUN 25 (*)    Creatinine, Ser 1.10 (*)    GFR calc non Af Amer 48 (*)    GFR calc Af Amer 56 (*)    All other components within normal limits  CBC - Abnormal; Notable for the following components:   WBC 11.0 (*)    All other components within normal limits  TSH  I-STAT TROPONIN, ED    EKG None  Radiology Dg Chest Portable 1 View  Result Date: 06/29/2018 CLINICAL DATA:  Chest pain EXAM: PORTABLE CHEST 1 VIEW COMPARISON:  None. FINDINGS: Lungs are clear. Heart size and pulmonary vascularity are normal. No adenopathy. No pneumothorax. No bone lesions. IMPRESSION: No edema or consolidation. Electronically Signed   By: Lowella Grip III  M.D.   On: 06/29/2018 21:52    Procedures Procedures (including critical care time)  Medications Ordered in ED Medications - No data to display   Initial Impression / Assessment and Plan / ED Course  I have reviewed the triage vital signs and the nursing notes.  Pertinent labs & imaging results that were available during my care of the patient were reviewed by me and considered in my medical decision making (see chart for details).     Patient is a 72 year old female with past medical history of hypertension, hyperlipidemia, and partial thyroid lobectomy who presents in the emergency department with chest pain that was sudden onset and severe.  The patient was concerned that she was having heart attack because she had chest pain that radiated up to her jaw and her back.  She was found to have A. fib with RVR and a rate of 150.  The patient spontaneously converted to normal sinus rhythm and now has a normal EKG without any signs of ST elevation, LVH, or arrhythmias.  Patient reports that her pain also resolved after she converted out of A. fib.  The patient denies any shortness of breath, nausea, vomiting, diarrhea, fevers, dysuria.  No signs of infection on physical exam, no leukocytosis on CBC, no signs of pneumonia on chest x-ray.  TSH within normal limits.  Troponin negative.  MP shows no electrolyte abnormalities, but does show a creatinine 1.10. Cardiology consulted as patient has new onset A. fib.  CHA2DS2-VASc score is 3.  Cardiology recommends PRN dosing as patient's already prescribed metoprolol for episodes where she is in A. fib  with RVR.  They recommend follow-up with the patient's cardiologist next week for echocardiogram and possible initiation of blood thinners.  They do not recommend blood thinners at this time.  Patient reports understanding of and agreement with her discharge instructions and return precautions.  Patient care supervised by Dr. Zenia Resides.  Irven Baltimore,  MD  Final Clinical Impressions(s) / ED Diagnoses   Final diagnoses:  Chest pain, unspecified type  Paroxysmal atrial fibrillation [facility])    ED Discharge Orders    None       Irven Baltimore, MD 06/30/18 1405    Lacretia Leigh, MD 06/30/18 1639

## 2018-07-12 ENCOUNTER — Encounter (HOSPITAL_COMMUNITY): Payer: Self-pay | Admitting: Nurse Practitioner

## 2018-07-12 ENCOUNTER — Ambulatory Visit (HOSPITAL_COMMUNITY)
Admission: RE | Admit: 2018-07-12 | Discharge: 2018-07-12 | Disposition: A | Discharge status: Home / Self Care | Payer: Medicare Other | Source: Ambulatory Visit | Attending: Nurse Practitioner | Admitting: Nurse Practitioner

## 2018-07-12 VITALS — BP 136/84 | HR 72 | Ht 62.0 in | Wt 148.0 lb

## 2018-07-12 DIAGNOSIS — M8580 Other specified disorders of bone density and structure, unspecified site: Secondary | ICD-10-CM | POA: Diagnosis not present

## 2018-07-12 DIAGNOSIS — Z9889 Other specified postprocedural states: Secondary | ICD-10-CM | POA: Insufficient documentation

## 2018-07-12 DIAGNOSIS — Z7901 Long term (current) use of anticoagulants: Secondary | ICD-10-CM | POA: Diagnosis not present

## 2018-07-12 DIAGNOSIS — E079 Disorder of thyroid, unspecified: Secondary | ICD-10-CM | POA: Diagnosis not present

## 2018-07-12 DIAGNOSIS — G4730 Sleep apnea, unspecified: Secondary | ICD-10-CM | POA: Insufficient documentation

## 2018-07-12 DIAGNOSIS — R079 Chest pain, unspecified: Secondary | ICD-10-CM | POA: Insufficient documentation

## 2018-07-12 DIAGNOSIS — Z800 Family history of malignant neoplasm of digestive organs: Secondary | ICD-10-CM | POA: Diagnosis not present

## 2018-07-12 DIAGNOSIS — H409 Unspecified glaucoma: Secondary | ICD-10-CM | POA: Insufficient documentation

## 2018-07-12 DIAGNOSIS — Z9842 Cataract extraction status, left eye: Secondary | ICD-10-CM | POA: Diagnosis not present

## 2018-07-12 DIAGNOSIS — E785 Hyperlipidemia, unspecified: Secondary | ICD-10-CM | POA: Insufficient documentation

## 2018-07-12 DIAGNOSIS — Z803 Family history of malignant neoplasm of breast: Secondary | ICD-10-CM | POA: Insufficient documentation

## 2018-07-12 DIAGNOSIS — Z791 Long term (current) use of non-steroidal anti-inflammatories (NSAID): Secondary | ICD-10-CM | POA: Insufficient documentation

## 2018-07-12 DIAGNOSIS — Z87891 Personal history of nicotine dependence: Secondary | ICD-10-CM | POA: Insufficient documentation

## 2018-07-12 DIAGNOSIS — Z79899 Other long term (current) drug therapy: Secondary | ICD-10-CM | POA: Insufficient documentation

## 2018-07-12 DIAGNOSIS — Z801 Family history of malignant neoplasm of trachea, bronchus and lung: Secondary | ICD-10-CM | POA: Insufficient documentation

## 2018-07-12 DIAGNOSIS — I10 Essential (primary) hypertension: Secondary | ICD-10-CM | POA: Diagnosis not present

## 2018-07-12 DIAGNOSIS — Z86010 Personal history of colonic polyps: Secondary | ICD-10-CM | POA: Diagnosis not present

## 2018-07-12 DIAGNOSIS — Z888 Allergy status to other drugs, medicaments and biological substances status: Secondary | ICD-10-CM | POA: Diagnosis not present

## 2018-07-12 DIAGNOSIS — I4891 Unspecified atrial fibrillation: Secondary | ICD-10-CM | POA: Diagnosis not present

## 2018-07-12 DIAGNOSIS — M1990 Unspecified osteoarthritis, unspecified site: Secondary | ICD-10-CM | POA: Diagnosis not present

## 2018-07-12 DIAGNOSIS — K219 Gastro-esophageal reflux disease without esophagitis: Secondary | ICD-10-CM | POA: Insufficient documentation

## 2018-07-12 DIAGNOSIS — Z881 Allergy status to other antibiotic agents status: Secondary | ICD-10-CM | POA: Diagnosis not present

## 2018-07-12 DIAGNOSIS — Z9841 Cataract extraction status, right eye: Secondary | ICD-10-CM | POA: Diagnosis not present

## 2018-07-12 DIAGNOSIS — Z811 Family history of alcohol abuse and dependence: Secondary | ICD-10-CM | POA: Insufficient documentation

## 2018-07-12 DIAGNOSIS — J45909 Unspecified asthma, uncomplicated: Secondary | ICD-10-CM | POA: Insufficient documentation

## 2018-07-12 DIAGNOSIS — I480 Paroxysmal atrial fibrillation: Secondary | ICD-10-CM

## 2018-07-12 DIAGNOSIS — Z8249 Family history of ischemic heart disease and other diseases of the circulatory system: Secondary | ICD-10-CM | POA: Insufficient documentation

## 2018-07-12 MED ORDER — APIXABAN 5 MG PO TABS: 5.0000 mg | ORAL_TABLET | Freq: Two times a day (BID) | ORAL | 0 refills | Status: DC

## 2018-07-12 MED ORDER — APIXABAN 5 MG PO TABS: 5.0000 mg | ORAL_TABLET | Freq: Two times a day (BID) | ORAL | 3 refills | Status: DC

## 2018-07-12 NOTE — Progress Notes (Addendum)
Primary Care Physician: [name], MD Referring Physician: [facility] ER f/u   Meagan Key is a 72 y.o. female with a h/o HTN, hyperlipidemia, and partial thyroid lobectomy, new onset afib with RVR at 150 bpm, associated with severe upper chest neck and jaw pain that was seen in [facility] ER, 9/10. This was associated with a prednisone taper for neck pain from a mechanical fall. She spontaneously converted in the ER and was d/c home. Troponin was negative. She has a chadsvasc score of 2,3 within the year as she turns 44. She was not started on anticoagulation as she was leaving for [place] the next day.   She is now in the afib clinic for f/u. She has not noted any further afib. She is off prednisone. She is limiting caffeine, minimum alcohol, does snore but she states that she had a borderline sleep study 4-5 years ago. She goes to  the gym 3-4 days a week.   Today, she denies symptoms of palpitations, chest pain, shortness of breath, orthopnea, PND, lower extremity edema, dizziness, presyncope, syncope, or neurologic sequela. The patient is tolerating medications without difficulties and is otherwise without complaint today.   Past Medical History:  Diagnosis Date  . Allergy   . Arthritis    hands  . Asthma    rarely uses inhaler  . Cataracts, bilateral   . Chicken pox   . Endometriosis   . GERD (gastroesophageal reflux disease)   . Glaucoma    surg for preventive for NARROW ANGLE  . Hematuria   . Hx of colonic polyp 06-12-2003   Colonoscopy Dr Amedeo Plenty  . Hyperlipemia   . Hypertension   . Hypertension   . Osteopenia 05/2013   T score -2.3 FRAX 13%/2.7%  . Sleep apnea   . Thyroid disease    nodule on thyroid   Past Surgical History:  Procedure Laterality Date  . APPENDECTOMY    . BREAST CYST EXCISION  AGE 23, 23, 8   bilateral  . BREAST EXCISIONAL BIOPSY Bilateral   . CATARACT EXTRACTION    . CATARACT EXTRACTION     x2  . COLONOSCOPY    . DILATATION & CURETTAGE/HYSTEROSCOPY  WITH TRUECLEAR N/A 02/22/2014   Procedure: DILATATION & CURETTAGE/HYSTEROSCOPY WITH TRUCLEAR;  Surgeon: [name], MD;  Location: [facility];  Service: Gynecology;  Laterality: N/A;  . ESOPHAGEAL MANOMETRY N/A 11/29/2012   Procedure: ESOPHAGEAL MANOMETRY (EM);  Surgeon: Sable Feil, MD;  Location: [facility];  Service: Endoscopy;  Laterality: N/A;  . 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 LAPAROSCOPIC ENDOMETRIOSIS FULGURATION  1972  . OOPHORECTOMY  1969   RSO  . POLYPECTOMY    . THYROIDECTOMY, PARTIAL  AGE 26  . TONSILLECTOMY    . UPPER GASTROINTESTINAL ENDOSCOPY  2013    Current Outpatient Medications  Medication Sig Dispense Refill  . acetaminophen (TYLENOL) 325 MG tablet Take 650 mg by mouth every 6 (six) hours as needed.    . Albuterol Sulfate (PROAIR HFA IN) Inhale into the lungs.    . AMLODIPINE BESYLATE PO Take 5 mg by mouth daily.     . Calcium Citrate (CITRACAL PO) Take 2 tablets by mouth daily. 500 mg Calcium & 1000 units of Vitamin D    . chlorpheniramine (CHLOR-TRIMETON) 4 MG tablet Take 4 mg by mouth every 6 (six) hours as needed for allergies.    Marland Kitchen esomeprazole (NEXIUM) 20 MG capsule Take 20 mg by  mouth 2 (two) times daily before a meal. 20 mg in am, 20-40 mg in pm if needed    . ibuprofen (ADVIL,MOTRIN) 200 MG tablet Take 200 mg by mouth every 6 (six) hours as needed for mild pain.    . metoprolol succinate (TOPROL-XL) 50 MG 24 hr tablet Take 50 mg by mouth daily.    . NONFORMULARY OR COMPOUNDED ITEM Estradiol vaginal cream 0.02% insert twice weekly 24 each 3  . pravastatin (PRAVACHOL) 10 MG tablet Take 10 mg by mouth daily.    Marland Kitchen apixaban (ELIQUIS) 5 MG TABS tablet Take 1 tablet (5 mg total) by mouth 2 (two) times daily. 60 tablet 0   Current Facility-Administered Medications  Medication Dose Route Frequency Provider Last Rate Last Dose  . 0.9 %  sodium chloride infusion  500 mL Intravenous Continuous Pyrtle, Lajuan Lines, MD         Allergies  Allergen Reactions  . Nitrofurantoin Shortness Of Breath and Other (See Comments)    Itching in throat   . Lisinopril Itching and Other (See Comments)    SOB and itching is in throat    Social History   Socioeconomic History  . Marital status: Married    Spouse name: Not on file  . Number of children: 2  . Years of education: Not on file  . Highest education level: Not on file  Occupational History  . Occupation: attorney    Employer: [employer]  . Financial resource strain: Not on file  . Food insecurity:    Worry: Not on file    Inability: Not on file  . Transportation needs:    Medical: Not on file    Non-medical: Not on file  Tobacco Use  . Smoking status: Former Smoker    Packs/day: 0.30    Years: 25.00    Pack years: 7.50    Types: Cigarettes    Last attempt to quit: 09/14/1984    Years since quitting: 33.8  . Smokeless tobacco: Never Used  Substance and Sexual Activity  . Alcohol use: Yes    Alcohol/week: 4.0 standard drinks    Types: 1 Glasses of wine, 3 Standard drinks or equivalent per week    Comment: few drinks/wk  . Drug use: No  . Sexual activity: Never    Birth control/protection: Post-menopausal    Comment: 1st intercourse 72 yo-Fewer than 5 partners  Lifestyle  . Physical activity:    Days per week: Not on file    Minutes per session: Not on file  . Stress: Not on file  Relationships  . Social connections:    Talks on phone: Not on file    Gets together: Not on file    Attends religious service: Not on file    Active member of club or organization: Not on file    Attends meetings of clubs or organizations: Not on file    Relationship status: Not on file  . Intimate partner violence:    Fear of current or ex partner: Not on file    Emotionally abused: Not on file    Physically abused: Not on file    Forced sexual activity: Not on file  Other Topics Concern  . Not on file  Social History Narrative    MARRIED; 2 CHILDREN   LAW DEGREE; ATTORNEY    Family History  Problem Relation Age of Onset  . Colon cancer Mother 42  . Alcohol abuse Mother   . Mental  illness Mother   . Lung cancer Father        smoker  . Alcohol abuse Father   . Hypertension Father   . Colon cancer Father 43  . Breast cancer Maternal Aunt        Age 69  . Heart attack Paternal Grandmother   . Other Cousin        mother side-vocal cord cancer    ROS- All systems are reviewed and negative except as per the HPI above  Physical Exam: Vitals:   07/12/18 0849  BP: 136/84  Pulse: 72  Weight: 67.1 kg  Height: 5\' 2"  (1.575 m)   Wt Readings from Last 3 Encounters:  07/12/18 67.1 kg  06/29/18 68 kg  09/16/17 67.1 kg    Labs: Lab Results  Component Value Date   NA 138 06/29/2018   K 4.3 06/29/2018   CL 104 06/29/2018   CO2 23 06/29/2018   GLUCOSE 205 (H) 06/29/2018   BUN 25 (H) 06/29/2018   CREATININE 1.10 (H) 06/29/2018   CALCIUM 9.5 06/29/2018   No results found for: INR Lab Results  Component Value Date   CHOL 168 01/28/2011   HDL 91.90 01/28/2011   LDLCALC 68 01/28/2011   TRIG 41.0 01/28/2011     GEN- The patient is well appearing, alert and oriented x 3 today.   Head- normocephalic, atraumatic Eyes-  Sclera clear, conjunctiva pink Ears- hearing intact Oropharynx- clear Neck- supple, no JVP Lymph- no cervical lymphadenopathy Lungs- Clear to ausculation bilaterally, normal work of breathing Heart- Regular rate and rhythm, no murmurs, rubs or gallops, PMI not laterally displaced GI- soft, NT, ND, + BS Extremities- no clubbing, cyanosis, or edema MS- no significant deformity or atrophy Skin- no rash or lesion Psych- euthymic mood, full affect Neuro- strength and sensation are intact  EKG-NSR at 72 bpm, pr int 126 ms, qrs int 76 ms, qtc 394 ms Epic records reviewed    Assessment and Plan: 1. New onset afib General education re afib Continue metoprolol without change Echo    2. Chadsvasc score 3 with next birthday in June as she will be 89 Discussed anticoagulation with pt Her husband has afib and is on eliquis  She would like to start eliquis 5 mg bid She denies a bleeding history  Bleeding precautions discussed  3. Severe chest pain with RVR May benefit form a stress test but denies any exertional symptoms She would like to establish with Dr. Percival Spanish as he is her husband's cardiologist   Requested  appointment with Dr. Percival Spanish in the next 8-12 weeks She will be notified of results of echo  9/27- Pt called the office to state that she is having bleeding gums since starting eliquis. 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 I talked to pharmD and at this point, since she had a chadsvasc score of 2(will be 3 next June, with change in anticoaguatlion guidelines with chadsvasc score of 3 now for females, and an isolated afib event, will stop eloquis at this time. Pt will keep on hand in  case she does have recurrent afib.   Geroge Baseman Shanisha Lech, [facility] [address] [place], [place] [address] [phone]

## 2018-07-12 NOTE — Patient Instructions (Signed)
Start Eliquis 5mg twice a day 

## 2018-07-16 ENCOUNTER — Telehealth: Payer: Self-pay | Admitting: *Deleted

## 2018-07-16 NOTE — Telephone Encounter (Signed)
Patient uses compound estradiol vaginal cream 0.02% twice weekly, doing well, patient reports when trying to insert the applicator its painful externally, no itching, no discharge, no odor,no blood and this only occurs when inserting the applicator. She wasn't sure if cream could be prescribed to use externally? She is also worried about this area when it time for annual exam. Any recommendations would be helpful. Please advise

## 2018-07-16 NOTE — Telephone Encounter (Signed)
Patient informed. 

## 2018-07-16 NOTE — Addendum Note (Signed)
Encounter addended by: Sherran Needs, NP on: 07/16/2018 8:50 AM  Actions taken: Sign clinical note, LOS modified

## 2018-07-16 NOTE — Telephone Encounter (Signed)
She can use a little bit of the cream from the vaginal applicator to apply externally around the irritated areas.  She can do this several times weekly and this may help strengthen the tissues.Marland Kitchen

## 2018-07-23 ENCOUNTER — Ambulatory Visit (HOSPITAL_COMMUNITY)
Admission: RE | Admit: 2018-07-23 | Discharge: 2018-07-23 | Disposition: A | Discharge status: Home / Self Care | Payer: Medicare Other | Source: Ambulatory Visit | Attending: Nurse Practitioner | Admitting: Nurse Practitioner

## 2018-07-23 ENCOUNTER — Other Ambulatory Visit (HOSPITAL_COMMUNITY): Payer: Medicare Other

## 2018-07-23 DIAGNOSIS — I081 Rheumatic disorders of both mitral and tricuspid valves: Secondary | ICD-10-CM | POA: Diagnosis not present

## 2018-07-23 DIAGNOSIS — I10 Essential (primary) hypertension: Secondary | ICD-10-CM | POA: Insufficient documentation

## 2018-07-23 DIAGNOSIS — E785 Hyperlipidemia, unspecified: Secondary | ICD-10-CM | POA: Diagnosis not present

## 2018-07-23 DIAGNOSIS — I480 Paroxysmal atrial fibrillation: Secondary | ICD-10-CM | POA: Diagnosis not present

## 2018-07-23 NOTE — Progress Notes (Signed)
  Echocardiogram 2D Echocardiogram has been performed.  Jennette Dubin 07/23/2018, 8:52 AM

## 2018-07-26 DIAGNOSIS — Z23 Encounter for immunization: Secondary | ICD-10-CM | POA: Diagnosis not present

## 2018-08-02 DIAGNOSIS — I10 Essential (primary) hypertension: Secondary | ICD-10-CM | POA: Diagnosis not present

## 2018-08-02 DIAGNOSIS — R82998 Other abnormal findings in urine: Secondary | ICD-10-CM | POA: Diagnosis not present

## 2018-08-02 DIAGNOSIS — M859 Disorder of bone density and structure, unspecified: Secondary | ICD-10-CM | POA: Diagnosis not present

## 2018-08-02 DIAGNOSIS — E7849 Other hyperlipidemia: Secondary | ICD-10-CM | POA: Diagnosis not present

## 2018-08-02 DIAGNOSIS — E048 Other specified nontoxic goiter: Secondary | ICD-10-CM | POA: Diagnosis not present

## 2018-08-05 NOTE — Progress Notes (Signed)
Cardiology Office Note   Date:  08/06/2018   ID:  Meagan Key, DOB 01/03/1946, MRN [id number]  PCP:  [name], MD  Cardiologist:   No primary care provider on file.   Chief Complaint  Patient presents with  . Atrial Fibrillation      History of Present Illness: Meagan Key is a 71 y.o. female who presents for followup of atrial fib.  She had atrial fib with RVR in Sept and was in the ED.  She converted spontaneously.  She was followed in the atrial fib clinic.    She was started on Eliquis.  She did have some bleeding gums.  Of note she did have chest pain during her RVR.   Since I last saw her she has done well.  The patient denies any new symptoms such as chest discomfort, neck or arm discomfort. There has been no new shortness of breath, PND or orthopnea. There have been no reported palpitations, presyncope or syncope.  She did a lot of w alking when she was in [place].  This happened when she walked two flights of stairs she had some chest discomfort and she had to rest.  She otherwise was without symptoms.   Past Medical History:  Diagnosis Date  . Allergy   . Arthritis    hands  . Asthma    rarely uses inhaler  . Cataracts, bilateral   . Chicken pox   . Endometriosis   . GERD (gastroesophageal reflux disease)   . Glaucoma    surg for preventive for NARROW ANGLE  . Hematuria   . Hx of colonic polyp 06-12-2003   Colonoscopy Dr Amedeo Plenty  . Hyperlipemia   . Hypertension   . Osteopenia 05/2013   T score -2.3 FRAX 13%/2.7%  . Sleep apnea    Borderline.   Did not require sleeping.    . Thyroid disease    nodule on thyroid    Past Surgical History:  Procedure Laterality Date  . APPENDECTOMY    . BREAST CYST EXCISION  AGE 71, 23, 2   bilateral  . BREAST EXCISIONAL BIOPSY Bilateral   . CATARACT EXTRACTION    . CATARACT EXTRACTION     x2  . COLONOSCOPY    . DILATATION & CURETTAGE/HYSTEROSCOPY WITH TRUECLEAR N/A 02/22/2014   Procedure: DILATATION &  CURETTAGE/HYSTEROSCOPY WITH TRUCLEAR;  Surgeon: [name], MD;  Location: [facility];  Service: Gynecology;  Laterality: N/A;  . ESOPHAGEAL MANOMETRY N/A 11/29/2012   Procedure: ESOPHAGEAL MANOMETRY (EM);  Surgeon: Sable Feil, MD;  Location: [facility];  Service: Endoscopy;  Laterality: N/A;  . 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 LAPAROSCOPIC ENDOMETRIOSIS FULGURATION  1972  . OOPHORECTOMY  1969   RSO  . POLYPECTOMY    . THYROIDECTOMY, PARTIAL  AGE 71  . TONSILLECTOMY    . UPPER GASTROINTESTINAL ENDOSCOPY  2013     Current Outpatient Medications  Medication Sig Dispense Refill  . acetaminophen (TYLENOL) 500 MG tablet Take 500 mg by mouth every 6 (six) hours as needed.     . Albuterol Sulfate (PROAIR HFA IN) Inhale into the lungs.    . AMLODIPINE BESYLATE PO Take 5 mg by mouth daily.     Marland Kitchen apixaban (ELIQUIS) 5 MG TABS tablet Take 1 tablet (5 mg total) by mouth 2 (two) times daily. 60 tablet 3  . Calcium Citrate (CITRACAL PO) Take 2 tablets by mouth daily. [place]  mg Calcium & 1000 units of Vitamin D    . chlorpheniramine (CHLOR-TRIMETON) 4 MG tablet Take 4 mg by mouth every 6 (six) hours as needed for allergies.    Marland Kitchen esomeprazole (NEXIUM) 20 MG capsule Take 20 mg by mouth 2 (two) times daily before a meal. 20 mg in am, 20-40 mg in pm if needed    . metoprolol succinate (TOPROL-XL) 50 MG 24 hr tablet Take 50 mg by mouth daily.    . NONFORMULARY OR COMPOUNDED ITEM Estradiol vaginal cream 0.02% insert twice weekly 24 each 3  . pravastatin (PRAVACHOL) 10 MG tablet Take 10 mg by mouth daily.     Current Facility-Administered Medications  Medication Dose Route Frequency Provider Last Rate Last Dose  . 0.9 %  sodium chloride infusion  500 mL Intravenous Continuous Pyrtle, Lajuan Lines, MD        Allergies:   Nitrofurantoin and Lisinopril    ROS:  Please see the history of present illness.   Otherwise, review of systems are positive for none.   All  other systems are reviewed and negative.    PHYSICAL EXAM: VS:  BP (!) 146/85   Pulse 71   Ht 5\' 2"  (1.575 m)   Wt 148 lb 12.8 oz (67.5 kg)   SpO2 100%   BMI 27.22 kg/m  , BMI Body mass index is 27.22 kg/m. GENERAL:  Well appearing NECK:  No jugular venous distention, waveform within normal limits, carotid upstroke brisk and symmetric, no bruits, no thyromegaly LUNGS:  Clear to auscultation bilaterally CHEST:  Unremarkable HEART:  PMI not displaced or sustained,S1 and S2 within normal limits, no S3, no S4, no clicks, no rubs, no murmurs ABD:  Flat, positive bowel sounds normal in frequency in pitch, no bruits, no rebound, no guarding, no midline pulsatile mass, no hepatomegaly, no splenomegaly EXT:  2 plus pulses throughout, no edema, no cyanosis no clubbing   EKG:  EKG is not ordered today.   Recent Labs: 06/29/2018: BUN 25; Creatinine, Ser 1.10; Hemoglobin 13.4; Platelets 251; Potassium 4.3; Sodium 138; TSH 1.257    Lipid Panel    Component Value Date/Time   CHOL 168 01/28/2011 0855   TRIG 41.0 01/28/2011 0855   HDL 91.90 01/28/2011 0855   CHOLHDL 2 01/28/2011 0855   VLDL 8.2 01/28/2011 0855   LDLCALC 68 01/28/2011 0855      Wt Readings from Last 3 Encounters:  08/06/18 148 lb 12.8 oz (67.5 kg)  07/12/18 148 lb (67.1 kg)  06/29/18 150 lb (68 kg)      Other studies Reviewed: Additional studies/ records that were reviewed today include: Hospital records. Review of the above records demonstrates:  Please see elsewhere in the note.     ASSESSMENT AND PLAN:  ATRIAL FIB:  Meagan Key has a CHA2DS2 - VASc score of 2.   She is had no further symptomatic episodes.  She tolerates anticoagulation.  No change in therapy.  CHEST PAIN:  I will bring the patient back for a POET (Plain Old Exercise Test). This will allow me to screen for obstructive coronary disease, risk stratify and very importantly provide a prescription for exercise.    Current medicines are  reviewed at length with the patient today.  The patient does not have concerns regarding medicines.  The following changes have been made:  no change  Labs/ tests ordered today include:   Orders Placed This Encounter  Procedures  . EXERCISE TOLERANCE TEST (ETT)  Disposition:   FU with me in 4 months.     Signed, Minus Breeding, MD  08/06/2018 10:13 AM    [place]

## 2018-08-06 ENCOUNTER — Encounter: Payer: Self-pay | Admitting: Cardiology

## 2018-08-06 ENCOUNTER — Ambulatory Visit (INDEPENDENT_AMBULATORY_CARE_PROVIDER_SITE_OTHER): Payer: Medicare Other | Admitting: Cardiology

## 2018-08-06 VITALS — BP 146/85 | HR 71 | Ht 62.0 in | Wt 148.8 lb

## 2018-08-06 DIAGNOSIS — I480 Paroxysmal atrial fibrillation: Secondary | ICD-10-CM | POA: Diagnosis not present

## 2018-08-06 DIAGNOSIS — R079 Chest pain, unspecified: Secondary | ICD-10-CM | POA: Diagnosis not present

## 2018-08-06 DIAGNOSIS — I482 Chronic atrial fibrillation, unspecified: Secondary | ICD-10-CM | POA: Insufficient documentation

## 2018-08-06 NOTE — Patient Instructions (Signed)
Medication Instructions:  Continue current medications  If you need a refill on your cardiac medications before your next appointment, please call your pharmacy.  Labwork: None Ordered   If you have labs (blood work) drawn today and your tests are completely normal, you will receive your results only by: Marland Kitchen MyChart Message (if you have MyChart) OR . A paper copy in the mail If you have any lab test that is abnormal or we need to change your treatment, we will call you to review the results.  Testing/Procedures: Your physician has requested that you have an exercise tolerance test. For further information please visit [email or web address]. Please also follow instruction sheet, as given.  Follow-Up: You will need a follow up appointment in 4 Months. You may see  DR Percival Spanish or one of the following Advanced Practice Providers on your designated Care Team:   . Jory Sims, DNP, ANP . Rhonda Barrett, PA-C .  Marland Kitchen Kerin Ransom, PA-C . Daleen Snook Kroeger, PA-C . Sande Rives, PA-C .  Marland Kitchen Almyra Deforest, PA-C . Fabian Sharp, PA-C  At [facility], you and your health needs are our priority.  As part of our continuing mission to provide you with exceptional heart care, we have created designated Provider Care Teams.  These Care Teams include your primary Cardiologist (physician) and Advanced Practice Providers (APPs -  Physician Assistants and Nurse Practitioners) who all work together to provide you with the care you need, when you need it.   Thank you for choosing [facility] at [facility]!!

## 2018-08-08 ENCOUNTER — Other Ambulatory Visit (HOSPITAL_COMMUNITY): Payer: Self-pay | Admitting: Nurse Practitioner

## 2018-08-09 DIAGNOSIS — R808 Other proteinuria: Secondary | ICD-10-CM | POA: Diagnosis not present

## 2018-08-09 DIAGNOSIS — I2721 Secondary pulmonary arterial hypertension: Secondary | ICD-10-CM | POA: Diagnosis not present

## 2018-08-09 DIAGNOSIS — I77819 Aortic ectasia, unspecified site: Secondary | ICD-10-CM | POA: Diagnosis not present

## 2018-08-09 DIAGNOSIS — Z1389 Encounter for screening for other disorder: Secondary | ICD-10-CM | POA: Diagnosis not present

## 2018-08-09 DIAGNOSIS — M1990 Unspecified osteoarthritis, unspecified site: Secondary | ICD-10-CM | POA: Diagnosis not present

## 2018-08-09 DIAGNOSIS — Z0000 Encounter for general adult medical examination without abnormal findings: Secondary | ICD-10-CM | POA: Diagnosis not present

## 2018-08-09 DIAGNOSIS — G4733 Obstructive sleep apnea (adult) (pediatric): Secondary | ICD-10-CM | POA: Diagnosis not present

## 2018-08-09 DIAGNOSIS — E559 Vitamin D deficiency, unspecified: Secondary | ICD-10-CM | POA: Diagnosis not present

## 2018-08-09 DIAGNOSIS — I480 Paroxysmal atrial fibrillation: Secondary | ICD-10-CM | POA: Diagnosis not present

## 2018-08-09 DIAGNOSIS — D472 Monoclonal gammopathy: Secondary | ICD-10-CM | POA: Diagnosis not present

## 2018-08-09 DIAGNOSIS — H401490 Capsular glaucoma with pseudoexfoliation of lens, unspecified eye, stage unspecified: Secondary | ICD-10-CM | POA: Diagnosis not present

## 2018-08-09 DIAGNOSIS — Z6827 Body mass index (BMI) 27.0-27.9, adult: Secondary | ICD-10-CM | POA: Diagnosis not present

## 2018-08-09 DIAGNOSIS — Z7901 Long term (current) use of anticoagulants: Secondary | ICD-10-CM | POA: Insufficient documentation

## 2018-08-10 ENCOUNTER — Telehealth (HOSPITAL_COMMUNITY): Payer: Self-pay

## 2018-08-10 NOTE — Telephone Encounter (Signed)
Encounter complete. 

## 2018-08-11 ENCOUNTER — Encounter (HOSPITAL_COMMUNITY): Payer: Medicare Other

## 2018-08-12 ENCOUNTER — Ambulatory Visit (HOSPITAL_COMMUNITY)
Admission: RE | Admit: 2018-08-12 | Discharge: 2018-08-12 | Disposition: A | Discharge status: Home / Self Care | Payer: Medicare Other | Source: Ambulatory Visit | Attending: Cardiovascular Disease | Admitting: Cardiovascular Disease

## 2018-08-12 DIAGNOSIS — R079 Chest pain, unspecified: Secondary | ICD-10-CM | POA: Insufficient documentation

## 2018-08-12 LAB — EXERCISE TOLERANCE TEST
CHL CUP MPHR: 146 {beats}/min
CHL RATE OF PERCEIVED EXERTION: 19
CSEPEDS: 1 s
CSEPHR: 109 %
Estimated workload: 10.1 METS
Exercise duration (min): 8 min
Peak HR: 160 {beats}/min
Rest HR: 71 {beats}/min

## 2018-08-19 ENCOUNTER — Telehealth: Payer: Self-pay | Admitting: Cardiology

## 2018-08-19 NOTE — Telephone Encounter (Signed)
Pt aware of gxt results with verbal understanding.

## 2018-08-19 NOTE — Telephone Encounter (Signed)
New Message:   Patient calling about stress test results.

## 2018-08-24 DIAGNOSIS — Z1212 Encounter for screening for malignant neoplasm of rectum: Secondary | ICD-10-CM | POA: Diagnosis not present

## 2018-09-23 ENCOUNTER — Ambulatory Visit (INDEPENDENT_AMBULATORY_CARE_PROVIDER_SITE_OTHER): Payer: Medicare Other | Admitting: Gynecology

## 2018-09-23 ENCOUNTER — Encounter: Payer: Self-pay | Admitting: Gynecology

## 2018-09-23 VITALS — BP 124/80 | Ht 62.0 in | Wt 149.0 lb

## 2018-09-23 DIAGNOSIS — Z124 Encounter for screening for malignant neoplasm of cervix: Secondary | ICD-10-CM

## 2018-09-23 DIAGNOSIS — Z01419 Encounter for gynecological examination (general) (routine) without abnormal findings: Secondary | ICD-10-CM | POA: Diagnosis not present

## 2018-09-23 DIAGNOSIS — N952 Postmenopausal atrophic vaginitis: Secondary | ICD-10-CM

## 2018-09-23 DIAGNOSIS — M8580 Other specified disorders of bone density and structure, unspecified site: Secondary | ICD-10-CM

## 2018-09-23 NOTE — Progress Notes (Signed)
    Meagan Key 22-Jul-1946 [id number]        72 y.o.  [id number] for breast and pelvic exam  Past medical history,surgical history, problem list, medications, allergies, family history and social history were all reviewed and documented as reviewed in the EPIC chart.  ROS:  Performed with pertinent positives and negatives included in the history, assessment and plan.   Additional significant findings : None   Exam: Meagan Key assistant Vitals:   09/23/18 1208  BP: 124/80  Weight: 149 lb (67.6 kg)  Height: 5\' 2"  (1.575 m)   Body mass index is 27.25 kg/m.  General appearance:  Normal affect, orientation and appearance. Skin: Grossly normal HEENT: Without gross lesions.  No cervical or supraclavicular adenopathy. Thyroid normal.  Lungs:  Clear without wheezing, rales or rhonchi Cardiac: RR, without RMG Abdominal:  Soft, nontender, without masses, guarding, rebound, organomegaly or hernia Breasts:  Examined lying and sitting without masses, retractions, discharge or axillary adenopathy. Pelvic:  Ext, BUS, Vagina: With atrophic changes  Cervix: With atrophic changes.  Pap smear done  Uterus: Anteverted, normal size, shape and contour, midline and mobile nontender   Adnexa: Without masses or tenderness    Anus and perineum: Normal   Rectovaginal: Normal sphincter tone without palpated masses or tenderness.    Assessment/Plan:  72 y.o. G65P2002 female for annual gynecologic exam.   1. Postmenopausal/atrophic genital changes.  Started on vaginal estradiol cream twice weekly through [place] last year.  Notes that her vaginal dryness and discomfort has resolved.  Also notes her urinary symptoms are better as far as her urgency/frequency and incontinence.  Wants to continue and she has a supply at home but will call when needs more.  We have discussed the issues of absorption with systemic effects/risks. 2. Mammography due this coming February and I reminded her to  follow-up for this.  Breast exam normal today. 3. Colonoscopy 2018.  Repeat at their recommended interval. 4. Osteopenia.  Being followed by Dr. Virgina Jock.  Last DEXA reported 2018. 5. Pap smear 2017.  Reviewed current screening guidelines and options to stop screening based on age and no history of abnormal Pap smears previously reviewed.  Patient uncomfortable with stop screening guidelines.  Pap smear done today. 6. Health maintenance.  No routine lab work done as patient does this elsewhere.  Follow-up 1 year, sooner as needed.   Meagan Auerbach MD, 12:48 PM 09/23/2018

## 2018-09-23 NOTE — Patient Instructions (Signed)
Follow-up in 1 year for annual exam, sooner if any issues. 

## 2018-09-28 LAB — PAP IG W/ RFLX HPV ASCU

## 2018-10-04 DIAGNOSIS — H268 Other specified cataract: Secondary | ICD-10-CM | POA: Diagnosis not present

## 2018-10-04 DIAGNOSIS — H40023 Open angle with borderline findings, high risk, bilateral: Secondary | ICD-10-CM | POA: Diagnosis not present

## 2018-10-14 ENCOUNTER — Other Ambulatory Visit: Payer: Self-pay | Admitting: *Deleted

## 2018-10-14 MED ORDER — NONFORMULARY OR COMPOUNDED ITEM: 3 refills | Status: DC

## 2018-10-21 ENCOUNTER — Telehealth: Payer: Self-pay | Admitting: Cardiology

## 2018-10-21 NOTE — Telephone Encounter (Signed)
LVM for patient to call office to r/s appt as there has been a change in provider's (Dr. Percival Spanish) schedule on 2/25. LVM left on cell phone  LVM was also left on 12/20 on home number.

## 2018-10-22 ENCOUNTER — Other Ambulatory Visit: Payer: Self-pay | Admitting: Internal Medicine

## 2018-10-22 DIAGNOSIS — Z1231 Encounter for screening mammogram for malignant neoplasm of breast: Secondary | ICD-10-CM

## 2018-11-26 ENCOUNTER — Ambulatory Visit: Payer: Medicare Other

## 2018-11-30 ENCOUNTER — Ambulatory Visit
Admission: RE | Admit: 2018-11-30 | Discharge: 2018-11-30 | Disposition: A | Discharge status: Home / Self Care | Payer: Medicare Other | Source: Ambulatory Visit | Attending: Internal Medicine | Admitting: Internal Medicine

## 2018-11-30 DIAGNOSIS — Z1231 Encounter for screening mammogram for malignant neoplasm of breast: Secondary | ICD-10-CM

## 2018-12-01 ENCOUNTER — Encounter: Payer: Self-pay | Admitting: Cardiology

## 2018-12-13 ENCOUNTER — Ambulatory Visit: Payer: Medicare Other | Admitting: Cardiology

## 2018-12-14 ENCOUNTER — Ambulatory Visit: Payer: Medicare Other | Admitting: Cardiology

## 2018-12-14 NOTE — Progress Notes (Signed)
Cardiology Office Note   Date:  12/16/2018   ID:  Meagan Key, DOB [birth year], MRN [id number]  PCP:  Meagan Baton, MD  Cardiologist:   No primary care provider on file.   Chief Complaint  Patient presents with  . Atrial Fibrillation      History of Present Illness: Meagan Key is a 73 y.o. female who presents for followup of atrial fib.  She had atrial fib with RVR in Sept and was in the ED.  She converted spontaneously.  She was followed in the atrial fib clinic.    She was started on Eliquis.  She did have some bleeding gums.  Of note she did have chest pain during her RVR.  At the last visit she had a low risk POET (Plain Old Exercise Treadmill).    Since I last saw her she is done well.  She is not had any symptomatic tachypalpitations. The patient denies any new symptoms such as chest discomfort, neck or arm discomfort. There has been no new shortness of breath, PND or orthopnea. There have been no reported palpitations, presyncope or syncope.  She is active.  She is working quite a bit.  She does golfing.  Past Medical History:  Diagnosis Date  . Allergy   . Arthritis    hands  . Asthma    rarely uses inhaler  . Atrial fibrillation ([place])   . Cataracts, bilateral   . Chicken pox   . Endometriosis   . GERD (gastroesophageal reflux disease)   . Glaucoma    surg for preventive for NARROW ANGLE  . Hematuria   . Hx of colonic polyp 06-12-2003   Colonoscopy Dr Amedeo Plenty  . Hyperlipemia   . Hypertension   . Osteopenia 05/2013   T score -2.3 FRAX 13%/2.7%  . Sleep apnea    Borderline.   Did not require sleeping.    . Thyroid disease    nodule on thyroid    Past Surgical History:  Procedure Laterality Date  . APPENDECTOMY    . BREAST CYST EXCISION  AGE 24, 23, 7   bilateral  . BREAST EXCISIONAL BIOPSY Bilateral   . CATARACT EXTRACTION    . CATARACT EXTRACTION     x2  . COLONOSCOPY    . DILATATION & CURETTAGE/HYSTEROSCOPY WITH TRUECLEAR N/A 02/22/2014   Procedure: DILATATION & CURETTAGE/HYSTEROSCOPY WITH TRUCLEAR;  Surgeon: Meagan Auerbach, MD;  Location: [facility];  Service: Gynecology;  Laterality: N/A;  . ESOPHAGEAL MANOMETRY N/A 11/29/2012   Procedure: ESOPHAGEAL MANOMETRY (EM);  Surgeon: Meagan Feil, MD;  Location: [facility];  Service: Endoscopy;  Laterality: N/A;  . 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 LAPAROSCOPIC ENDOMETRIOSIS FULGURATION  1972  . OOPHORECTOMY  1969   RSO  . POLYPECTOMY    . THYROIDECTOMY, PARTIAL  AGE 88  . TONSILLECTOMY    . UPPER GASTROINTESTINAL ENDOSCOPY  2013     Current Outpatient Medications  Medication Sig Dispense Refill  . acetaminophen (TYLENOL) 500 MG tablet Take 500 mg by mouth every 6 (six) hours as needed.     . Albuterol Sulfate (PROAIR HFA IN) Inhale into the lungs.    Marland Kitchen amLODipine (NORVASC) 5 MG tablet Take 5 mg by mouth daily.     . Calcium Citrate (CITRACAL PO) Take 2 tablets by mouth daily. 500 mg Calcium & 1000 units of Vitamin D    . chlorpheniramine (CHLOR-TRIMETON) 4 MG tablet Take  4 mg by mouth every 6 (six) hours as needed for allergies.    Marland Kitchen ELIQUIS 5 MG TABS tablet TAKE 1 TABLET BY MOUTH TWICE DAILY. 60 tablet 6  . esomeprazole (NEXIUM) 20 MG capsule Take 20 mg by mouth 2 (two) times daily before a meal. 20 mg in am, 20-40 mg in pm if needed    . metoprolol succinate (TOPROL-XL) 50 MG 24 hr tablet Take 50 mg by mouth daily.    . NONFORMULARY OR COMPOUNDED ITEM Estradiol vaginal cream 1 applicator  4.00% insert twice weekly 24 each 3  . pravastatin (PRAVACHOL) 40 MG tablet Take 1 tablet (40 mg total) by mouth daily. 90 tablet 3   Current Facility-Administered Medications  Medication Dose Route Frequency Provider Last Rate Last Dose  . 0.9 %  sodium chloride infusion  500 mL Intravenous Continuous Meagan Key, Meagan Lines, MD        Allergies:   Nitrofurantoin and Lisinopril    ROS:  Please see the history of present illness.   Otherwise, review  of systems are positive for mild hemorrhoidal bleeding..   All other systems are reviewed and negative.    PHYSICAL EXAM: VS:  BP (!) 144/76   Pulse 74   Ht 5\' 2"  (1.575 m)   Wt 151 lb 6.4 oz (68.7 kg)   BMI 27.69 kg/m  , BMI Body mass index is 27.69 kg/m. GENERAL:  Well appearing NECK:  No jugular venous distention, waveform within normal limits, carotid upstroke brisk and symmetric, no bruits, no thyromegaly LUNGS:  Clear to auscultation bilaterally CHEST:  Unremarkable HEART:  PMI not displaced or sustained,S1 and S2 within normal limits, no S3, no S4, no clicks, no rubs, no murmurs ABD:  Flat, positive bowel sounds normal in frequency in pitch, no bruits, no rebound, no guarding, no midline pulsatile mass, no hepatomegaly, no splenomegaly EXT:  2 plus pulses throughout, no edema, no cyanosis no clubbing   EKG:  EKG is not ordered today.   Recent Labs: 06/29/2018: BUN 25; Creatinine, Ser 1.10; Hemoglobin 13.4; Platelets 251; Potassium 4.3; Sodium 138; TSH 1.257    Lipid Panel    Component Value Date/Time   CHOL 168 01/28/2011 0855   TRIG 41.0 01/28/2011 0855   HDL 91.90 01/28/2011 0855   CHOLHDL 2 01/28/2011 0855   VLDL 8.2 01/28/2011 0855   LDLCALC 68 01/28/2011 0855      Wt Readings from Last 3 Encounters:  12/16/18 151 lb 6.4 oz (68.7 kg)  09/23/18 149 lb (67.6 kg)  08/06/18 148 lb 12.8 oz (67.5 kg)      Other studies Reviewed: Additional studies/ records that were reviewed today include: Labs Review of the above records demonstrates:  See below   ASSESSMENT AND PLAN:  ATRIAL FIB:  Ms. Meagan Key has a CHA2DS2 - VASc score of 2.   We had discussion about the possibility of her having paroxysms of silent atrial fibrillation.  Given this possibility she prefers to continue anticoagulation rather than risk of stroke.  No change in therapy.  HTN: Blood pressure is slightly elevated but at other times it has not been looking over readings.  She is getting  keep a blood pressure diary.  She might need to have a slightly higher dose of Norvasc.  DYSLIPIDEMIA: Her LDL was at 134.  She did not tolerate Lipitor.  She does agree to try a higher dose of pravastatin to 40 mg daily.  .   Current medicines are reviewed at  length with the patient today.  The patient does not have concerns regarding medicines.  The following changes have been made:  As above  Labs/ tests ordered today include:  None  No orders of the defined types were placed in this encounter.    Disposition:   FU with me in 12 months.     Signed, Minus Breeding, MD  12/16/2018 9:59 AM    [facility]

## 2018-12-16 ENCOUNTER — Ambulatory Visit (INDEPENDENT_AMBULATORY_CARE_PROVIDER_SITE_OTHER): Payer: Medicare Other | Admitting: Cardiology

## 2018-12-16 ENCOUNTER — Encounter: Payer: Self-pay | Admitting: Cardiology

## 2018-12-16 VITALS — BP 144/76 | HR 74 | Ht 62.0 in | Wt 151.4 lb

## 2018-12-16 DIAGNOSIS — I480 Paroxysmal atrial fibrillation: Secondary | ICD-10-CM

## 2018-12-16 DIAGNOSIS — E785 Hyperlipidemia, unspecified: Secondary | ICD-10-CM

## 2018-12-16 DIAGNOSIS — I10 Essential (primary) hypertension: Secondary | ICD-10-CM | POA: Diagnosis not present

## 2018-12-16 MED ORDER — PRAVASTATIN SODIUM 40 MG PO TABS: 40.0000 mg | ORAL_TABLET | Freq: Every day | ORAL | 3 refills | Status: DC

## 2018-12-16 NOTE — Addendum Note (Signed)
Addended by: Vennie Homans on: 12/16/2018 03:58 PM   Modules accepted: Orders

## 2018-12-16 NOTE — Patient Instructions (Signed)
Medication Instructions:  INCREASE- Pravastatin 40 mg daily  If you need a refill on your cardiac medications before your next appointment, please call your pharmacy.  Labwork: None Ordered   Testing/Procedures: None Ordered   Follow-Up: You will need a follow up appointment in 1 Year.  Please call our office 2 months in advance to schedule this appointment.  You may see Dr Percival Spanish or one of the following Advanced Practice Providers on your designated Care Team:   Rosaria Ferries, PA-C . Jory Sims, DNP, ANP    At [facility], you and your health needs are our priority.  As part of our continuing mission to provide you with exceptional heart care, we have created designated Provider Care Teams.  These Care Teams include your primary Cardiologist (physician) and Advanced Practice Providers (APPs -  Physician Assistants and Nurse Practitioners) who all work together to provide you with the care you need, when you need it.  Thank you for choosing [facility] at [facility]!!

## 2019-02-01 ENCOUNTER — Telehealth: Payer: Self-pay | Admitting: Cardiology

## 2019-02-01 NOTE — Telephone Encounter (Signed)
New message   Pt c/o medication issue:  1. Name of Medication:amLODipine (NORVASC) 5 MG tablet  2. How are you currently taking this medication (dosage and times per day)? 1 time daily  3. Are you having a reaction (difficulty breathing--STAT)? No   4. What is your medication issue?Patient wants to know if she should increase this medication. Patient b/p is higher than normal. Please call to discuss.

## 2019-02-02 NOTE — Telephone Encounter (Signed)
SPOKE TO PATIENT. She was concerned about blood pressure if she need to  Increase  Medication. She states at her last office visit dr Percival Spanish had mention if blood pressure  Continue to to be increase  , he may  Increase the amlodipine dose.   patient states blood pressure has been arranging between 120's -130's at high 140 /80 -89.  It occasionally  Goes to 144-162/80- 89.  Patient states this has occurred not often since last visit.  RN  Informed to continue to monitor  For the present time continue with current dose amlodipine 5 mg.  Patient verbalized understanding.

## 2019-02-02 NOTE — Telephone Encounter (Signed)
LMTCB on cell provided by the pt.. no answer at her home number

## 2019-03-01 DIAGNOSIS — Z7901 Long term (current) use of anticoagulants: Secondary | ICD-10-CM | POA: Diagnosis not present

## 2019-03-01 DIAGNOSIS — H409 Unspecified glaucoma: Secondary | ICD-10-CM | POA: Diagnosis not present

## 2019-03-01 DIAGNOSIS — G4733 Obstructive sleep apnea (adult) (pediatric): Secondary | ICD-10-CM | POA: Diagnosis not present

## 2019-03-01 DIAGNOSIS — I10 Essential (primary) hypertension: Secondary | ICD-10-CM | POA: Diagnosis not present

## 2019-03-01 DIAGNOSIS — I2721 Secondary pulmonary arterial hypertension: Secondary | ICD-10-CM | POA: Diagnosis not present

## 2019-03-01 DIAGNOSIS — N3941 Urge incontinence: Secondary | ICD-10-CM | POA: Diagnosis not present

## 2019-03-01 DIAGNOSIS — I77819 Aortic ectasia, unspecified site: Secondary | ICD-10-CM | POA: Diagnosis not present

## 2019-03-01 DIAGNOSIS — D472 Monoclonal gammopathy: Secondary | ICD-10-CM | POA: Diagnosis not present

## 2019-03-01 DIAGNOSIS — K219 Gastro-esophageal reflux disease without esophagitis: Secondary | ICD-10-CM | POA: Diagnosis not present

## 2019-03-01 DIAGNOSIS — E785 Hyperlipidemia, unspecified: Secondary | ICD-10-CM | POA: Diagnosis not present

## 2019-03-01 DIAGNOSIS — I480 Paroxysmal atrial fibrillation: Secondary | ICD-10-CM | POA: Diagnosis not present

## 2019-03-07 ENCOUNTER — Other Ambulatory Visit (HOSPITAL_COMMUNITY): Payer: Self-pay | Admitting: Nurse Practitioner

## 2019-03-07 NOTE — Telephone Encounter (Signed)
73yo 68.7kg Scr= 1.10 on 06/29/2018 OV DR Percival Spanish 12/16/2018

## 2019-05-10 DIAGNOSIS — R300 Dysuria: Secondary | ICD-10-CM | POA: Insufficient documentation

## 2019-06-20 ENCOUNTER — Telehealth: Payer: Self-pay | Admitting: Internal Medicine

## 2019-06-20 NOTE — Telephone Encounter (Signed)
Pt states she has noticed a change in her bowel habits, reports stools are "ribbon" shaped. Also reports BRBPR. Pt concerned due to family history. Pt scheduled to see Dr. Hilarie Fredrickson 07/25/19@2 :10pm. Pt aware of appt.

## 2019-07-02 DIAGNOSIS — Z23 Encounter for immunization: Secondary | ICD-10-CM | POA: Diagnosis not present

## 2019-07-20 ENCOUNTER — Encounter: Payer: Self-pay | Admitting: *Deleted

## 2019-07-25 ENCOUNTER — Encounter: Payer: Self-pay | Admitting: Internal Medicine

## 2019-07-25 ENCOUNTER — Other Ambulatory Visit (INDEPENDENT_AMBULATORY_CARE_PROVIDER_SITE_OTHER): Payer: Medicare Other

## 2019-07-25 ENCOUNTER — Other Ambulatory Visit: Payer: Self-pay

## 2019-07-25 ENCOUNTER — Ambulatory Visit (INDEPENDENT_AMBULATORY_CARE_PROVIDER_SITE_OTHER): Payer: Medicare Other | Admitting: Internal Medicine

## 2019-07-25 ENCOUNTER — Telehealth: Payer: Self-pay | Admitting: *Deleted

## 2019-07-25 VITALS — BP 124/80 | HR 86 | Temp 97.4°F | Ht 62.0 in | Wt 151.4 lb

## 2019-07-25 DIAGNOSIS — R194 Change in bowel habit: Secondary | ICD-10-CM

## 2019-07-25 DIAGNOSIS — K625 Hemorrhage of anus and rectum: Secondary | ICD-10-CM

## 2019-07-25 DIAGNOSIS — Z86010 Personal history of colonic polyps: Secondary | ICD-10-CM

## 2019-07-25 DIAGNOSIS — Z800 Family history of malignant neoplasm of digestive organs: Secondary | ICD-10-CM

## 2019-07-25 DIAGNOSIS — Z7901 Long term (current) use of anticoagulants: Secondary | ICD-10-CM

## 2019-07-25 LAB — COMPREHENSIVE METABOLIC PANEL
ALT: 13 U/L (ref 0–35)
AST: 16 U/L (ref 0–37)
Albumin: 4.2 g/dL (ref 3.5–5.2)
Alkaline Phosphatase: 52 U/L (ref 39–117)
BUN: 23 mg/dL (ref 6–23)
CO2: 29 mEq/L (ref 19–32)
Calcium: 9 mg/dL (ref 8.4–10.5)
Chloride: 106 mEq/L (ref 96–112)
Creatinine, Ser: 0.83 mg/dL (ref 0.40–1.20)
GFR: 66.96 mL/min (ref >60.00)
Glucose, Bld: 127 mg/dL — ABNORMAL HIGH (ref 70–99)
Potassium: 3.9 mEq/L (ref 3.5–5.1)
Sodium: 141 mEq/L (ref 135–145)
Total Bilirubin: 0.3 mg/dL (ref 0.2–1.2)
Total Protein: 7 g/dL (ref 6.0–8.3)

## 2019-07-25 LAB — CBC WITH DIFFERENTIAL/PLATELET
Basophils Absolute: 0 10*3/uL (ref 0.0–0.1)
Basophils Relative: 0.5 % (ref 0.0–3.0)
Eosinophils Absolute: 0.1 10*3/uL (ref 0.0–0.7)
Eosinophils Relative: 1.7 % (ref 0.0–5.0)
HCT: 38.5 % (ref 36.0–46.0)
Hemoglobin: 12.9 g/dL (ref 12.0–15.0)
Lymphocytes Relative: 22.1 % (ref 12.0–46.0)
Lymphs Abs: 1.6 10*3/uL (ref 0.7–4.0)
MCHC: 33.6 g/dL (ref 30.0–36.0)
MCV: 88.8 fl (ref 78.0–100.0)
Monocytes Absolute: 0.4 10*3/uL (ref 0.1–1.0)
Monocytes Relative: 4.9 % (ref 3.0–12.0)
Neutro Abs: 5.1 10*3/uL (ref 1.4–7.7)
Neutrophils Relative %: 70.8 % (ref 43.0–77.0)
Platelets: 207 10*3/uL (ref 150.0–400.0)
RBC: 4.34 Mil/uL (ref 3.87–5.11)
RDW: 13.8 % (ref 11.5–15.5)
WBC: 7.2 10*3/uL (ref 4.0–10.5)

## 2019-07-25 MED ORDER — SUPREP BOWEL PREP KIT 17.5-3.13-1.6 GM/177ML PO SOLN: 1.0000 | ORAL | 0 refills | Status: DC

## 2019-07-25 NOTE — Telephone Encounter (Signed)
Request for surgical clearance:     Endoscopy Procedure  What type of surgery is being performed?     colon  When is this surgery scheduled?     08/11/2019  What type of clearance is required ?   Pharmacy  Are there any medications that need to be held prior to surgery and how long? Eliquis, 2 days  Practice name and name of physician performing surgery?      [facility]  What is your office phone and fax number?      Phone- [phone]  [phone]  Anesthesia type (None, local, MAC, general) ?       MAC

## 2019-07-25 NOTE — Telephone Encounter (Signed)
Patient with diagnosis of afib on Eliquis for anticoagulation.    Procedure: colonoscopy Date of procedure: 08/11/2019  CHADS2-VASc score of  5 (CHF, HTN, AGE, AGE, female)  CrCl 53 ml/min  Per office protocol, patient can hold Eliquis for 2 days prior to procedure.

## 2019-07-25 NOTE — Progress Notes (Signed)
   Subjective:    Patient ID: Meagan Key, female    DOB: [birth year], 73 y.o.   MRN: [id number]  HPI Marylene Bruyere is a 73 year old female with a past medical history of adenomatous colon polyps, small internal hemorrhoids, family history of colon cancer, atrial fibrillation on Eliquis, hypertension, hyperlipidemia, sleep apnea who is seen for follow-up to evaluate rectal bleeding and change in bowel habit.  She is known to me from colonoscopy performed in January 2018.  This revealed 4 subcentimeter polyps found to be adenomatous without high-grade dysplasia.  Internal hemorrhoids were seen on retroflexion.  She reports that just about 2 months ago she developed a change in bowel habit.  It started with narrow and thinner yellow stringy stools.  She reported this was starkly different than her prior bowel habits which was daily bowel movement but a normal size brown in color.  She is also had intermittent bleeding which she feels is likely hemorrhoidal.  She sees this particularly after exercising either walking or playing golf.  It does occur after bowel movement.  The blood is fresh and bright red.  It comes and goes.  She has had some mild lower abdominal discomfort also comes and goes.  No upper GI or hepatobiliary complaint.  She continues Eliquis under the direction of Dr. Percival Spanish   Review of Systems As per HPI, otherwise negative  Current Medications, Allergies, Past Medical History, Past Surgical History, Family History and Social History were reviewed in [facility] record.     Objective:   Physical Exam BP 124/80   Pulse 86   Temp (!) 97.4 F (36.3 C)   Ht 5\' 2"  (1.575 m)   Wt 151 lb 6 oz (68.7 kg)   BMI 27.69 kg/m  Gen: awake, alert, NAD HEENT: anicteric, op clear CV: RRR, no mrg Pulm: CTA b/l Abd: soft, NT/ND, +BS throughout Ext: no c/c/e Neuro: nonfocal      Assessment & Plan:  73 year old female with a past medical history of  adenomatous colon polyps, small internal hemorrhoids, family history of colon cancer, atrial fibrillation on Eliquis, hypertension, hyperlipidemia, sleep apnea who is seen for follow-up to evaluate rectal bleeding and change in bowel habit.  1.  Personal history of adenomatous colon polyp/family history of colon cancer/change in bowel habits/intermittent rectal bleeding --unclear what has caused her change in bowel habit though I have reassured her that I feel it is unlikely that she would have developed a colon cancer.  She is concerned about this with her family history.  Given that she is due soon for surveillance endoscopy combined with the change in bowel habit I recommended we proceed to colonoscopy at this time.  I will also check a CBC and CMP today.  Her rectal bleeding may be hemorrhoidal even her history of internal hemorrhoids but the colonoscopy will help determine etiology. --Colonoscopy --CBC CMP  Will hold Eliquis 2 days prior to endoscopic procedures - will instruct when and how to resume after procedure. Benefits and risks of procedure explained including risks of bleeding, perforation, infection, missed lesions, reactions to medications and possible need for hospitalization and surgery for complications. Additional rare but real risk of stroke or other vascular clotting events off Eliquis also explained and need to seek urgent help if any signs of these problems occur. Will communicate by phone or EMR with patient's  prescribing provider to confirm that holding Eliquis is reasonable in this case.

## 2019-07-25 NOTE — Patient Instructions (Addendum)
You have been scheduled for a colonoscopy. Please follow written instructions given to you at your visit today.  Please pick up your prep supplies at the pharmacy within the next 1-3 days. If you use inhalers (even only as needed), please bring them with you on the day of your procedure.  Your provider has requested that you go to the basement level for lab work before leaving today. Press "B" on the elevator. The lab is located at the first door on the left as you exit the elevator.  If you are age 2 or older, your body mass index should be between 23-30. Your Body mass index is 27.69 kg/m. If this is out of the aforementioned range listed, please consider follow up with your Primary Care Provider.  If you are age 26 or younger, your body mass index should be between 19-25. Your Body mass index is 27.69 kg/m. If this is out of the aformentioned range listed, please consider follow up with your Primary Care Provider.

## 2019-07-26 NOTE — Telephone Encounter (Signed)
   Primary Cardiologist: Minus Breeding, MD  Chart reviewed as part of pre-operative protocol coverage. Given past medical history and time since last visit, based on ACC/AHA guidelines, Meagan Key would be at acceptable risk for the planned procedure without further cardiovascular testing.   Patient with diagnosis of atrial fibrillation on Eliquis for anticoagulation with a CHADS2-VASc score of  5 (CHF, HTN, AGE, AGE, female) and a CrCl 53 ml/min  Per office protocol, patient can hold Eliquis for 2 days prior to procedure.    I will route this recommendation to the requesting party via Epic fax function and remove from pre-op pool.  Please call with questions.  [name], NP 07/26/2019, 8:07 AM

## 2019-07-26 NOTE — Telephone Encounter (Signed)
I have spoken to patient to advise that per Dr Hochrein's office, she may hold Eliquis 2 days prior to her upcoming procedure. Patient verbalizes understanding of this.

## 2019-07-28 ENCOUNTER — Encounter: Payer: Self-pay | Admitting: Gynecology

## 2019-08-05 ENCOUNTER — Telehealth: Payer: Self-pay | Admitting: Internal Medicine

## 2019-08-05 NOTE — Telephone Encounter (Signed)
Several things: If her asthma is truly flaring and she is having a difficult time breathing then the colonoscopy should be delayed anyway However if asthma is improving I would recommend that we do a Covid screen on Tuesday through [facility] pathology, our new algorithm, which should result on Wednesday.  On Wednesday if negative she can proceed with prep for procedure on Thursday. Again all assuming asthma under control Please let me know if she has questions

## 2019-08-05 NOTE — Telephone Encounter (Signed)
See note.  Okay to proceed with procedure next Thursday?  Should we insist on COVID screen?

## 2019-08-05 NOTE — Telephone Encounter (Signed)
Called patient and asked her to call us on Monday and let us know how her asthma is doing, and then we will make the decision weather to proceed with the COVID test on Tues and Colonscopy on Thurs.(per Dr.Pyrtle)

## 2019-08-08 ENCOUNTER — Telehealth: Payer: Self-pay | Admitting: Internal Medicine

## 2019-08-08 ENCOUNTER — Other Ambulatory Visit: Payer: Self-pay

## 2019-08-08 ENCOUNTER — Telehealth: Payer: Self-pay

## 2019-08-08 DIAGNOSIS — Z20822 Contact with and (suspected) exposure to covid-19: Secondary | ICD-10-CM

## 2019-08-08 NOTE — Telephone Encounter (Signed)
Patient called and states she is still having to use her Inhaler every 4 hours and can't do any exertion. She is going to call her PCP and try to see a Pulmonologist. Will cancel her colonoscopy in the [place] on 08/11/19 (per Dr. Vena Rua note)

## 2019-08-08 NOTE — Telephone Encounter (Signed)
Patient will call back and reschedule colonoscopy when she feels better

## 2019-08-08 NOTE — Telephone Encounter (Signed)
Agree, and thank you  Dottie, Let's keep an eye here and reschedule in a month or so Thanks JMP

## 2019-08-08 NOTE — Telephone Encounter (Signed)
Noted  

## 2019-08-09 DIAGNOSIS — R82998 Other abnormal findings in urine: Secondary | ICD-10-CM | POA: Diagnosis not present

## 2019-08-09 DIAGNOSIS — E049 Nontoxic goiter, unspecified: Secondary | ICD-10-CM | POA: Diagnosis not present

## 2019-08-09 DIAGNOSIS — E7849 Other hyperlipidemia: Secondary | ICD-10-CM | POA: Diagnosis not present

## 2019-08-09 DIAGNOSIS — M859 Disorder of bone density and structure, unspecified: Secondary | ICD-10-CM | POA: Diagnosis not present

## 2019-08-10 ENCOUNTER — Ambulatory Visit (INDEPENDENT_AMBULATORY_CARE_PROVIDER_SITE_OTHER): Payer: Medicare Other | Admitting: Pulmonary Disease

## 2019-08-10 ENCOUNTER — Other Ambulatory Visit: Payer: Self-pay

## 2019-08-10 ENCOUNTER — Encounter: Payer: Self-pay | Admitting: Pulmonary Disease

## 2019-08-10 ENCOUNTER — Institutional Professional Consult (permissible substitution): Payer: Medicare Other | Admitting: Pulmonary Disease

## 2019-08-10 VITALS — BP 128/88 | HR 77 | Temp 98.4°F | Ht 62.0 in | Wt 151.0 lb

## 2019-08-10 DIAGNOSIS — Z9109 Other allergy status, other than to drugs and biological substances: Secondary | ICD-10-CM

## 2019-08-10 DIAGNOSIS — J4521 Mild intermittent asthma with (acute) exacerbation: Secondary | ICD-10-CM | POA: Diagnosis not present

## 2019-08-10 LAB — NOVEL CORONAVIRUS, NAA: SARS-CoV-2, NAA: NOT DETECTED

## 2019-08-10 MED ORDER — BUDESONIDE-FORMOTEROL FUMARATE 160-4.5 MCG/ACT IN AERO: 2.0000 | INHALATION_SPRAY | Freq: Two times a day (BID) | RESPIRATORY_TRACT | 0 refills | Status: DC

## 2019-08-10 NOTE — Progress Notes (Signed)
Synopsis: Referred in Oct 2020 (self referral) for asthma symptoms by [name], MD  Subjective:   PATIENT ID: Meagan Key GENDER: female DOB: 1945/10/27, MRN: [id number]  Chief Complaint  Patient presents with  . Consult    Consult for SOB. Former patient of [facility]. She reports the SOB developed about a week ago. She reports a hx of asthma. She reports her usual trigger is a cold or being around smoke. Using albuterol every 4-6 hours everyday for past week.     PMH of allergies and asthma. Originally diagnosed in her 63s.  Patient had a colonoscopy scheduled for this week however it was canceled due to her ongoing asthma symptoms.  She was given prednisone by her primary care.  She does state that she is breathing better.  She was at the beach last week did not do a whole lot.  Was not physically exerting herself.  She does like to play golf when she gets a chance.  Her last bout with asthma symptoms to include trouble breathing wheezing chest tightness occurred approximately 2 years ago.  Prior to that was maybe around 5 years ago.  She was last seen by Dr. Annamaria Boots in 2015.  Been treated in the past with albuterol and steroids.  She has never been on a maintenance inhaler.  Never hospitalized.  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  When she moved to a new office here in [place] for nearly 15 years had no symptoms at all.  Recently she has been using her albuterol inhaler approximately every 6 hours.  She has some nocturnal respiratory complaints.  She also has noticed a significant change in breathing related to colder air.   Past Medical History:  Diagnosis Date  . Allergy   . Arthritis    hands  . Asthma    rarely uses inhaler  . Atrial fibrillation ([place])   . Cataracts, bilateral   . Chicken pox   . Endometriosis   . Esophageal dysfunction   . GERD (gastroesophageal reflux  disease)   . Glaucoma    surg for preventive for NARROW ANGLE  . Hematuria   . Hx of colonic polyp 06-12-2003   Colonoscopy Dr Amedeo Plenty  . Hyperlipemia   . Hypertension   . Osteopenia 05/2013   T score -2.3 FRAX 13%/2.7%  . Sleep apnea    Borderline.   Did not require sleeping.    . Thyroid disease    nodule on thyroid  . Tubular adenoma of colon      Family History  Problem Relation Age of Onset  . Colon cancer Mother 69  . Alcohol abuse Mother   . Mental illness Mother   . Lung cancer Father        smoker  . Alcohol abuse Father   . Hypertension Father   . Colon cancer Father 62  . Breast cancer Maternal Aunt        Age 31  . Heart attack Paternal Grandmother   . Other Cousin        mother side-vocal cord cancer     Past Surgical History:  Procedure Laterality Date  . APPENDECTOMY    . BREAST CYST EXCISION  AGE 36, 23, 47   bilateral  . BREAST EXCISIONAL BIOPSY Bilateral   . CATARACT EXTRACTION    . CATARACT EXTRACTION     x2  . COLONOSCOPY    .  DILATATION & CURETTAGE/HYSTEROSCOPY WITH TRUECLEAR N/A 02/22/2014   Procedure: DILATATION & CURETTAGE/HYSTEROSCOPY WITH TRUCLEAR;  Surgeon: [name], MD;  Location: [facility];  Service: Gynecology;  Laterality: N/A;  . ESOPHAGEAL MANOMETRY N/A 11/29/2012   Procedure: ESOPHAGEAL MANOMETRY (EM);  Surgeon: Sable Feil, MD;  Location: [facility];  Service: Endoscopy;  Laterality: N/A;  . 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 LAPAROSCOPIC ENDOMETRIOSIS FULGURATION  1972  . OOPHORECTOMY  1969   RSO  . POLYPECTOMY    . THYROIDECTOMY, PARTIAL  AGE 47  . TONSILLECTOMY    . UPPER GASTROINTESTINAL ENDOSCOPY  2013    Social History   Socioeconomic History  . Marital status: Married    Spouse name: Not on file  . Number of children: 2  . Years of education: Not on file  . Highest education level: Not on file  Occupational History  . Occupation: attorney    Employer: [employer]  . Financial resource strain: Not on file  . Food insecurity    Worry: Not on file    Inability: Not on file  . Transportation needs    Medical: Not on file    Non-medical: Not on file  Tobacco Use  . Smoking status: Former Smoker    Packs/day: 0.30    Years: 25.00    Pack years: 7.50    Types: Cigarettes    Quit date: 09/14/1984    Years since quitting: 34.9  . Smokeless tobacco: Never Used  Substance and Sexual Activity  . Alcohol use: Yes    Comment: once a week  . Drug use: No  . Sexual activity: Never    Birth control/protection: Post-menopausal    Comment: 1st intercourse 73 yo-Fewer than 5 partners  Lifestyle  . Physical activity    Days per week: Not on file    Minutes per session: Not on file  . Stress: Not on file  Relationships  . Social Herbalist on phone: Not on file    Gets together: Not on file    Attends religious service: Not on file    Active member of club or organization: Not on file    Attends meetings of clubs or organizations: Not on file    Relationship status: Not on file  . Intimate partner violence    Fear of current or ex partner: Not on file    Emotionally abused: Not on file    Physically abused: Not on file    Forced sexual activity: Not on file  Other Topics Concern  . Not on file  Social History Narrative   MARRIED; 2 CHILDREN   LAW DEGREE; ATTORNEY     Allergies  Allergen Reactions  . Nitrofurantoin Shortness Of Breath and Other (See Comments)    Itching in throat   . Lisinopril Itching and Other (See Comments)    SOB and itching is in throat     Outpatient Medications Prior to Visit  Medication Sig Dispense Refill  . acetaminophen (TYLENOL) 500 MG tablet Take 500 mg by mouth as needed (Takes it about 5 times a week).     . Albuterol Sulfate (PROAIR HFA IN) Inhale into the lungs.    Marland Kitchen amLODipine (NORVASC) 5 MG tablet Take 5 mg by mouth daily.     . Calcium Citrate (CITRACAL PO) Take 2  tablets by mouth daily. 500 mg Calcium & 1000 units of  Vitamin D    . chlorpheniramine (CHLOR-TRIMETON) 4 MG tablet Take 4 mg by mouth every 6 (six) hours as needed for allergies.    Marland Kitchen ELIQUIS 5 MG TABS tablet TAKE 1 TABLET BY MOUTH TWICE DAILY. 60 tablet 4  . esomeprazole (NEXIUM) 20 MG capsule Take 20 mg by mouth daily.     . metoprolol succinate (TOPROL-XL) 50 MG 24 hr tablet Take 50 mg by mouth daily.    . NONFORMULARY OR COMPOUNDED ITEM Estradiol vaginal cream 1 applicator  1.61% insert twice weekly 24 each 3  . pravastatin (PRAVACHOL) 40 MG tablet Take 1 tablet (40 mg total) by mouth daily. 90 tablet 3  . SUPREP BOWEL PREP KIT 17.5-3.13-1.6 GM/177ML SOLN Take 1 kit by mouth as directed. For colonoscopy prep 354 mL 0   No facility-administered medications prior to visit.     Review of Systems  Constitutional: Negative for chills, fever, malaise/fatigue and weight loss.  HENT: Negative for hearing loss, sore throat and tinnitus.   Eyes: Negative for blurred vision and double vision.  Respiratory: Positive for shortness of breath and wheezing. Negative for cough, hemoptysis, sputum production and stridor.   Cardiovascular: Negative for chest pain, palpitations, orthopnea, leg swelling and PND.  Gastrointestinal: Negative for abdominal pain, constipation, diarrhea, heartburn, nausea and vomiting.  Genitourinary: Negative for dysuria, hematuria and urgency.  Musculoskeletal: Negative for joint pain and myalgias.  Skin: Negative for itching and rash.  Neurological: Negative for dizziness, tingling, weakness and headaches.  Endo/Heme/Allergies: Negative for environmental allergies. Does not bruise/bleed easily.  Psychiatric/Behavioral: Negative for depression. The patient is not nervous/anxious and does not have insomnia.   All other systems reviewed and are negative.    Objective:  Physical Exam Vitals signs reviewed.  Constitutional:      General: She is not in acute distress.     Appearance: She is well-developed.  HENT:     Head: Normocephalic and atraumatic.  Eyes:     General: No scleral icterus.    Conjunctiva/sclera: Conjunctivae normal.     Pupils: Pupils are equal, round, and reactive to light.  Neck:     Musculoskeletal: Neck supple.     Vascular: No JVD.     Trachea: No tracheal deviation.  Cardiovascular:     Rate and Rhythm: Normal rate and regular rhythm.     Heart sounds: Normal heart sounds. No murmur.  Pulmonary:     Effort: Pulmonary effort is normal. No tachypnea, accessory muscle usage or respiratory distress.     Breath sounds: Normal breath sounds. No stridor. No wheezing, rhonchi or rales.  Abdominal:     General: Bowel sounds are normal. There is no distension.     Palpations: Abdomen is soft.     Tenderness: There is no abdominal tenderness.  Musculoskeletal:        General: No tenderness.  Lymphadenopathy:     Cervical: No cervical adenopathy.  Skin:    General: Skin is warm and dry.     Capillary Refill: Capillary refill takes less than 2 seconds.     Findings: No rash.  Neurological:     Mental Status: She is alert and oriented to person, place, and time.  Psychiatric:        Behavior: Behavior normal.      Vitals:   08/10/19 1536  BP: 128/88  Pulse: 77  Temp: 98.4 F (36.9 C)  TempSrc: Temporal  SpO2: 97%  Weight: 151 lb (68.5 kg)  Height: '5\' 2"'$  (1.575  m)   97% on RA BMI Readings from Last 3 Encounters:  08/10/19 27.62 kg/m  07/25/19 27.69 kg/m  12/16/18 27.69 kg/m   Wt Readings from Last 3 Encounters:  08/10/19 151 lb (68.5 kg)  07/25/19 151 lb 6 oz (68.7 kg)  12/16/18 151 lb 6.4 oz (68.7 kg)     CBC    Component Value Date/Time   WBC 7.2 07/25/2019 1512   RBC 4.34 07/25/2019 1512   HGB 12.9 07/25/2019 1512   HCT 38.5 07/25/2019 1512   PLT 207.0 07/25/2019 1512   MCV 88.8 07/25/2019 1512   MCH 29.7 06/29/2018 2130   MCHC 33.6 07/25/2019 1512   RDW 13.8 07/25/2019 1512   LYMPHSABS 1.6  07/25/2019 1512   MONOABS 0.4 07/25/2019 1512   EOSABS 0.1 07/25/2019 1512   BASOSABS 0.0 07/25/2019 1512    Chest Imaging: No recent chest imaging  Pulmonary Functions Testing Results: No flowsheet data found.  FeNO: None   Pathology: None   Echocardiogram: None   Heart Catheterization: None     Assessment & Plan:     ICD-10-CM   1. Mild intermittent asthma with acute exacerbation  J45.21   2. Environmental allergies  Z91.09     Discussion:  73 year old female with mild intermittent asthma, recent exacerbation.  Slowly recovering after completion of steroid dosing plus albuterol use.  Plan: Today we discussed risk benefits and alternatives of proceeding with a maintenance inhaler.  I believe that she may actually benefit from a ICS/LABA as needed use.  Symbicort has been approved for as needed use of mild intermittent asthma symptoms.  And there is at least 2 trials that have shown no difference in outcome regular maintenance daily use versus as needed use for mild disease.  We will give her samples today of Symbicort 160 to be used 2 puffs, twice daily. She is to let us know if her symptoms are better while she is on maintenance therapy or if they are about the same. she is slowly recovering and I think she will be back to normal by the next week. If her symptoms continue then I think that she would benefit from repeat pulmonary function test at some point to make sure there is not anything else going on.  Patient should let us know how her symptoms are.  Patient to return to clinic in 6 months or as needed depending on symptoms.  Greater than 50% of this patient's 30-minute office visit was spent face-to-face discussing the above recommendations and treatment plan.     Current Outpatient Medications:  .  acetaminophen (TYLENOL) 500 MG tablet, Take 500 mg by mouth as needed (Takes it about 5 times a week). , Disp: , Rfl:  .  Albuterol Sulfate (PROAIR HFA IN),  Inhale into the lungs., Disp: , Rfl:  .  amLODipine (NORVASC) 5 MG tablet, Take 5 mg by mouth daily. , Disp: , Rfl:  .  Calcium Citrate (CITRACAL PO), Take 2 tablets by mouth daily. 500 mg Calcium & 1000 units of Vitamin D, Disp: , Rfl:  .  chlorpheniramine (CHLOR-TRIMETON) 4 MG tablet, Take 4 mg by mouth every 6 (six) hours as needed for allergies., Disp: , Rfl:  .  ELIQUIS 5 MG TABS tablet, TAKE 1 TABLET BY MOUTH TWICE DAILY., Disp: 60 tablet, Rfl: 4 .  esomeprazole (NEXIUM) 20 MG capsule, Take 20 mg by mouth daily. , Disp: , Rfl:  .  metoprolol succinate (TOPROL-XL) 50 MG 24 hr tablet, Take 50  mg by mouth daily., Disp: , Rfl:  .  NONFORMULARY OR COMPOUNDED ITEM, Estradiol vaginal cream 1 applicator  3.14% insert twice weekly, Disp: 24 each, Rfl: 3 .  pravastatin (PRAVACHOL) 40 MG tablet, Take 1 tablet (40 mg total) by mouth daily., Disp: 90 tablet, Rfl: 3 .  SUPREP BOWEL PREP KIT 17.5-3.13-1.6 GM/177ML SOLN, Take 1 kit by mouth as directed. For colonoscopy prep, Disp: 354 mL, Rfl: 0 .  budesonide-formoterol (SYMBICORT) 160-4.5 MCG/ACT inhaler, Inhale 2 puffs into the lungs every 12 (twelve) hours., Disp: 1 Inhaler, Rfl: 0   Garner Nash, DO [facility] 08/10/2019 4:22 PM

## 2019-08-10 NOTE — Patient Instructions (Signed)
Thank you for visiting Dr. Valeta Harms at [facility]. Today we recommend the following:  Start samples of symbicort 160, 2 puffs twice daily  Return in about 6 months (around 02/08/2020).    Please do your part to reduce the spread of COVID-19.

## 2019-08-11 ENCOUNTER — Encounter: Payer: Medicare Other | Admitting: Internal Medicine

## 2019-08-15 DIAGNOSIS — Z1212 Encounter for screening for malignant neoplasm of rectum: Secondary | ICD-10-CM | POA: Diagnosis not present

## 2019-08-16 DIAGNOSIS — N952 Postmenopausal atrophic vaginitis: Secondary | ICD-10-CM | POA: Diagnosis not present

## 2019-08-16 DIAGNOSIS — Z0000 Encounter for general adult medical examination without abnormal findings: Secondary | ICD-10-CM | POA: Diagnosis not present

## 2019-08-16 DIAGNOSIS — Z7901 Long term (current) use of anticoagulants: Secondary | ICD-10-CM | POA: Diagnosis not present

## 2019-08-16 DIAGNOSIS — I2721 Secondary pulmonary arterial hypertension: Secondary | ICD-10-CM | POA: Diagnosis not present

## 2019-08-16 DIAGNOSIS — K219 Gastro-esophageal reflux disease without esophagitis: Secondary | ICD-10-CM | POA: Diagnosis not present

## 2019-08-16 DIAGNOSIS — H409 Unspecified glaucoma: Secondary | ICD-10-CM | POA: Diagnosis not present

## 2019-08-16 DIAGNOSIS — I480 Paroxysmal atrial fibrillation: Secondary | ICD-10-CM | POA: Diagnosis not present

## 2019-08-16 DIAGNOSIS — R194 Change in bowel habit: Secondary | ICD-10-CM | POA: Insufficient documentation

## 2019-08-16 DIAGNOSIS — H401490 Capsular glaucoma with pseudoexfoliation of lens, unspecified eye, stage unspecified: Secondary | ICD-10-CM | POA: Diagnosis not present

## 2019-08-16 DIAGNOSIS — I77819 Aortic ectasia, unspecified site: Secondary | ICD-10-CM | POA: Diagnosis not present

## 2019-08-16 DIAGNOSIS — J45909 Unspecified asthma, uncomplicated: Secondary | ICD-10-CM | POA: Diagnosis not present

## 2019-08-16 DIAGNOSIS — D472 Monoclonal gammopathy: Secondary | ICD-10-CM | POA: Diagnosis not present

## 2019-08-17 ENCOUNTER — Institutional Professional Consult (permissible substitution): Payer: Medicare Other | Admitting: Pulmonary Disease

## 2019-08-19 NOTE — Telephone Encounter (Signed)
Left message for patient to call back  

## 2019-08-22 ENCOUNTER — Telehealth: Payer: Self-pay | Admitting: Pulmonary Disease

## 2019-08-22 NOTE — Telephone Encounter (Signed)
Spoke with patient. She states she likes the symbicort. She was to call and let Dr. Valeta Harms know. Dr. Valeta Harms will be back in office Wednesday 08/24/19 patient will have enough for that morning . I instructed her if she doesn't hear back from [place] by lunch to call our office and we can address it at that time for a sample or an Rx.  Dr. Valeta Harms please advise.

## 2019-08-23 NOTE — Telephone Encounter (Signed)
Pt returned your call, pls [phone].

## 2019-08-24 ENCOUNTER — Telehealth: Payer: Self-pay | Admitting: Internal Medicine

## 2019-08-24 MED ORDER — BUDESONIDE-FORMOTEROL FUMARATE 160-4.5 MCG/ACT IN AERO: 2.0000 | INHALATION_SPRAY | Freq: Two times a day (BID) | RESPIRATORY_TRACT | 0 refills | Status: DC

## 2019-08-24 MED ORDER — BUDESONIDE-FORMOTEROL FUMARATE 160-4.5 MCG/ACT IN AERO: 2.0000 | INHALATION_SPRAY | Freq: Two times a day (BID) | RESPIRATORY_TRACT | 3 refills | Status: DC

## 2019-08-24 NOTE — Telephone Encounter (Signed)
PCCM: Yes ok for a new prescription sent to her Rx. Ok for any samples if we have any from the office if needed.  Garner Nash, DO [facility] 08/24/2019 8:00 AM

## 2019-08-24 NOTE — Telephone Encounter (Signed)
I called and spoke with patient and made her aware that we are going to send in her Symbicort since she is happy with it and its helping her. I also advised her that I will place a sample at the front for her to pick up at her convenience. She verbalized understanding and nothing further is needed at this time.

## 2019-08-25 NOTE — Telephone Encounter (Signed)
Okay to return for direct procedure when pulmonary issues resolve with prior permission to hold anticoagulation

## 2019-08-25 NOTE — Telephone Encounter (Signed)
See previous phone note.  

## 2019-08-25 NOTE — Telephone Encounter (Signed)
I have spoken to patient to advise of Dr Meagan Key recommendation and she verbalizes understanding. She will contact us when she is ready to reschedule.

## 2019-08-25 NOTE — Telephone Encounter (Signed)
Dr Hilarie Fredrickson, I spoke to patient who indicates she saw Dr Valeta Harms recently and while she is feeling much better than a couple weeks ago, she is still having to use an inhaler a couple times per day and doesn't feel she is quite ready to have anesthesia. She thinks maybe a couple more weeks and she would be ready to reschedule.   Do you think she needs OV prior to rescheduling procedure or can I reschedule her directly? Also, should the anticoag clearance still be okay from 07/25/19? She indicates she has NOT had any cardiac issues recently.Meagan KitchenMarland Key

## 2019-08-26 ENCOUNTER — Other Ambulatory Visit: Payer: Self-pay | Admitting: Cardiology

## 2019-09-06 ENCOUNTER — Telehealth: Payer: Self-pay | Admitting: Pulmonary Disease

## 2019-09-06 NOTE — Telephone Encounter (Signed)
Called and spoke with pt letting her know that Aaron Edelman said to stop the sym 160 and that we should get her in for an appt with [facility] to discuss inhaler change. Pt verbalized understanding. appt scheduled for pt with BI tomorrow, 11/18 at 11:30.  Also stated to pt that if the palpitations continued after stopping inhaler she needed to call either pcp or cards and pt verbalized understanding. Nothing further needed.

## 2019-09-06 NOTE — Telephone Encounter (Signed)
Primary Pulmonologist: BI Last office visit and with whom: 08/10/2019 with BI What do we see them for (pulmonary problems): asthma Last OV assessment/plan: Plan: Today we discussed risk benefits and alternatives of proceeding with a maintenance inhaler.  I believe that she may actually benefit from a ICS/LABA as needed use.  Symbicort has been approved for as needed use of mild intermittent asthma symptoms.  And there is at least 2 trials that have shown no difference in outcome regular maintenance daily use versus as needed use for mild disease.  We will give her samples today of Symbicort 160 to be used 2 puffs, twice daily. She is to let us know if her symptoms are better while she is on maintenance therapy or if they are about the same. she is slowly recovering and I think she will be back to normal by the next week. If her symptoms continue then I think that she would benefit from repeat pulmonary function test at some point to make sure there is not anything else going on.  Patient should let us know how her symptoms are.  Patient to return to clinic in 6 months or as needed depending on symptoms.  Greater than 50% of this patient's 30-minute office visit was spent face-to-face discussing the above recommendations and treatment plan.  Instructions    Return in about 6 months (around 02/08/2020). Thank you for visiting Dr. Valeta Harms at [facility]. Today we recommend the following:  Start samples of symbicort 160, 2 puffs twice daily  Return in about 6 months (around 02/08/2020).     Was appointment offered to patient (explain)?  Pt wants recommendations   Reason for call: Called and spoke with pt who stated she is on the symbicort inhaler which she believes is giving her an irregular heartbeat.  Pt said a year ago, she took cortisone and ended up in the ER with afib. Pt said after reading ingredients in the symbicort, she saw that there was cortisone in it which she  believes is what is causing her problems.  Pt said she had afib two nights ago as this was the first time she felt it and then since then she has had an irregular heartbeat. Pt said she has woken up in the middle of the night with a racing heartbeat.  Pt has not taken her symbicort today and is wanting to know what she should do.  Aaron Edelman, please advise on this for pt. Thanks!  (examples of things to ask: : When did symptoms start? Fever? Cough? Productive? Color to sputum? More sputum than usual? Wheezing? Have you needed increased oxygen? Are you taking your respiratory medications? What over the counter measures have you tried?)

## 2019-09-06 NOTE — Telephone Encounter (Signed)
09/06/2019 1000  Okay to stop Symbicort 160 inhaler at this time.    Please move up appointment to see Dr. Valeta Harms to next available to discuss.  I would recommend the patient follows up with primary care or cardiology if she continues to have ongoing palpitations despite stopping inhaler.  May need to consider trial of Symbicort 80 inhaler in the future.  Unfortunately we do not have samples of this.  Patient also likely needs to be scheduled for pulmonary function test.  This can be discussed at next office visit with Dr. Valeta Harms.  Wyn Quaker, FNP

## 2019-09-07 ENCOUNTER — Ambulatory Visit (INDEPENDENT_AMBULATORY_CARE_PROVIDER_SITE_OTHER): Payer: Medicare Other | Admitting: Pulmonary Disease

## 2019-09-07 ENCOUNTER — Other Ambulatory Visit: Payer: Self-pay

## 2019-09-07 ENCOUNTER — Encounter: Payer: Self-pay | Admitting: Pulmonary Disease

## 2019-09-07 VITALS — BP 126/88 | HR 69 | Temp 97.5°F | Ht 62.0 in | Wt 150.8 lb

## 2019-09-07 DIAGNOSIS — Z9109 Other allergy status, other than to drugs and biological substances: Secondary | ICD-10-CM

## 2019-09-07 DIAGNOSIS — J4521 Mild intermittent asthma with (acute) exacerbation: Secondary | ICD-10-CM

## 2019-09-07 DIAGNOSIS — R000 Tachycardia, unspecified: Secondary | ICD-10-CM | POA: Diagnosis not present

## 2019-09-07 NOTE — Patient Instructions (Signed)
Thank you for visiting Dr. Valeta Harms at [facility]. Today we recommend the following:  Continue to monitor symptoms. Let me know if they change.   Use albuterol as needed for SOB and wheezing.  Hold use of symbicort for now Can use Symbicort as needed, q12H 1-2 puffs if symptoms return.    Return in about 6 months (around 03/06/2020).    Please do your part to reduce the spread of COVID-19.

## 2019-09-07 NOTE — Progress Notes (Signed)
Synopsis: Referred in Oct 2020 (self referral) for asthma symptoms by [name], MD  Subjective:   PATIENT ID: Meagan Key GENDER: female DOB: 1945/11/22, MRN: [id number]  Chief Complaint  Patient presents with  . Follow-up    Symbicort may have caused A-Fib. Established with cardiology.     PMH of allergies and asthma. Originally diagnosed in her 72s.  Patient had a colonoscopy scheduled for this week however it was canceled due to her ongoing asthma symptoms.  She was given prednisone by her primary care.  She does state that she is breathing better.  She was at the beach last week did not do a whole lot.  Was not physically exerting herself.  She does like to play golf when she gets a chance.  Her last bout with asthma symptoms to include trouble breathing wheezing chest tightness occurred approximately 2 years ago.  Prior to that was maybe around 5 years ago.  She was last seen by Dr. Annamaria Boots in 2015.  Been treated in the past with albuterol and steroids.  She has never been on a maintenance inhaler.  Never hospitalized.  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  When she moved to a new office here in [place] for nearly 15 years had no symptoms at all.  Recently she has been using her albuterol inhaler approximately every 6 hours.  She has some nocturnal respiratory complaints.  She also has noticed a significant change in breathing related to colder air.  OV 09/07/2019: Patient doing well today.  Recently had episodes of tachycardia that she felt was related to increasing in her A. fib.  She does believe that start of the Symbicort improved her respiratory symptoms.  She is concerned that the Symbicort could be related to her increase in tachycardia and atrial fibrillation.  She has not been using her albuterol recently.  She is concerned about needing to stay on a maintenance therapy.  She  would like to go back to an albuterol as needed.  We discussed the risk benefits and alternatives of this today in the office.   Past Medical History:  Diagnosis Date  . Allergy   . Arthritis    hands  . Asthma    rarely uses inhaler  . Atrial fibrillation ([place])   . Cataracts, bilateral   . Chicken pox   . Endometriosis   . Esophageal dysfunction   . GERD (gastroesophageal reflux disease)   . Glaucoma    surg for preventive for NARROW ANGLE  . Hematuria   . Hx of colonic polyp 06-12-2003   Colonoscopy Dr Amedeo Plenty  . Hyperlipemia   . Hypertension   . Osteopenia 05/2013   T score -2.3 FRAX 13%/2.7%  . Sleep apnea    Borderline.   Did not require sleeping.    . Thyroid disease    nodule on thyroid  . Tubular adenoma of colon      Family History  Problem Relation Age of Onset  . Colon cancer Mother 50  . Alcohol abuse Mother   . Mental illness Mother   . Lung cancer Father        smoker  . Alcohol abuse Father   . Hypertension Father   . Colon cancer Father 82  . Breast cancer Maternal Aunt        Age 59  . Heart attack Paternal Grandmother   .  Other Cousin        mother side-vocal cord cancer     Past Surgical History:  Procedure Laterality Date  . APPENDECTOMY    . BREAST CYST EXCISION  AGE 20, 23, 56   bilateral  . BREAST EXCISIONAL BIOPSY Bilateral   . CATARACT EXTRACTION    . CATARACT EXTRACTION     x2  . COLONOSCOPY    . DILATATION & CURETTAGE/HYSTEROSCOPY WITH TRUECLEAR N/A 02/22/2014   Procedure: DILATATION & CURETTAGE/HYSTEROSCOPY WITH TRUCLEAR;  Surgeon: Timothy P Fontaine, MD;  Location: [facility];  Service: Gynecology;  Laterality: N/A;  . ESOPHAGEAL MANOMETRY N/A 11/29/2012   Procedure: ESOPHAGEAL MANOMETRY (EM);  Surgeon: David R Patterson, MD;  Location: [facility];  Service: Endoscopy;  Laterality: N/A;  . 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 LAPAROSCOPIC ENDOMETRIOSIS FULGURATION  1972  . OOPHORECTOMY  1969    RSO  . POLYPECTOMY    . THYROIDECTOMY, PARTIAL  AGE 22  . TONSILLECTOMY    . UPPER GASTROINTESTINAL ENDOSCOPY  2013    Social History   Socioeconomic History  . Marital status: Married    Spouse name: Not on file  . Number of children: 2  . Years of education: Not on file  . Highest education level: Not on file  Occupational History  . Occupation: attorney    Employer: [employer]  Social Needs  . Financial resource strain: Not on file  . Food insecurity    Worry: Not on file    Inability: Not on file  . Transportation needs    Medical: Not on file    Non-medical: Not on file  Tobacco Use  . Smoking status: Former Smoker    Packs/day: 0.30    Years: 25.00    Pack years: 7.50    Types: Cigarettes    Quit date: 09/14/1984    Years since quitting: 35.0  . Smokeless tobacco: Never Used  Substance and Sexual Activity  . Alcohol use: Yes    Comment: once a week  . Drug use: No  . Sexual activity: Never    Birth control/protection: Post-menopausal    Comment: 1st intercourse 73 yo-Fewer than 5 partners  Lifestyle  . Physical activity    Days per week: Not on file    Minutes per session: Not on file  . Stress: Not on file  Relationships  . Social connections    Talks on phone: Not on file    Gets together: Not on file    Attends religious service: Not on file    Active member of club or organization: Not on file    Attends meetings of clubs or organizations: Not on file    Relationship status: Not on file  . Intimate partner violence    Fear of current or ex partner: Not on file    Emotionally abused: Not on file    Physically abused: Not on file    Forced sexual activity: Not on file  Other Topics Concern  . Not on file  Social History Narrative   MARRIED; 2 CHILDREN   LAW DEGREE; ATTORNEY     Allergies  Allergen Reactions  . Nitrofurantoin Shortness Of Breath and Other (See Comments)    Itching in throat   . Cortisone     A-fib  . Lisinopril  Itching and Other (See Comments)    SOB and itching is in throat  . Symbicort [Budesonide-Formoterol Fumarate]       A-fib     Outpatient Medications Prior to Visit  Medication Sig Dispense Refill  . acetaminophen (TYLENOL) 500 MG tablet Take 500 mg by mouth as needed (Takes it about 5 times a week).     . Albuterol Sulfate (PROAIR HFA IN) Inhale into the lungs.    . amLODipine (NORVASC) 5 MG tablet Take 5 mg by mouth daily.     . Calcium Citrate (CITRACAL PO) Take 2 tablets by mouth daily. 500 mg Calcium & 1000 units of Vitamin D    . chlorpheniramine (CHLOR-TRIMETON) 4 MG tablet Take 4 mg by mouth every 6 (six) hours as needed for allergies.    . ELIQUIS 5 MG TABS tablet TAKE 1 TABLET BY MOUTH TWICE DAILY. 60 tablet 0  . esomeprazole (NEXIUM) 20 MG capsule Take 20 mg by mouth daily.     . metoprolol succinate (TOPROL-XL) 50 MG 24 hr tablet Take 50 mg by mouth daily.    . NONFORMULARY OR COMPOUNDED ITEM Estradiol vaginal cream 1 applicator  0.02% insert twice weekly 24 each 3  . pravastatin (PRAVACHOL) 40 MG tablet Take 1 tablet (40 mg total) by mouth daily. 90 tablet 3  . SUPREP BOWEL PREP KIT 17.5-3.13-1.6 GM/177ML SOLN Take 1 kit by mouth as directed. For colonoscopy prep 354 mL 0  . budesonide-formoterol (SYMBICORT) 160-4.5 MCG/ACT inhaler Inhale 2 puffs into the lungs every 12 (twelve) hours. (Patient not taking: Reported on 09/07/2019) 1 Inhaler 0  . budesonide-formoterol (SYMBICORT) 160-4.5 MCG/ACT inhaler Inhale 2 puffs into the lungs 2 (two) times daily. (Patient not taking: Reported on 09/07/2019) 1 Inhaler 0  . budesonide-formoterol (SYMBICORT) 160-4.5 MCG/ACT inhaler Inhale 2 puffs into the lungs 2 (two) times daily. (Patient not taking: Reported on 09/07/2019) 1 Inhaler 3   No facility-administered medications prior to visit.     Review of Systems  Constitutional: Negative for chills, fever, malaise/fatigue and weight loss.  HENT: Negative for hearing loss, sore throat and  tinnitus.   Eyes: Negative for blurred vision and double vision.  Respiratory: Negative for cough, hemoptysis, sputum production, shortness of breath, wheezing and stridor.   Cardiovascular: Positive for palpitations. Negative for chest pain, orthopnea, leg swelling and PND.  Gastrointestinal: Negative for abdominal pain, constipation, diarrhea, heartburn, nausea and vomiting.  Genitourinary: Negative for dysuria, hematuria and urgency.  Musculoskeletal: Negative for joint pain and myalgias.  Skin: Negative for itching and rash.  Neurological: Negative for dizziness, tingling, weakness and headaches.  Endo/Heme/Allergies: Negative for environmental allergies. Does not bruise/bleed easily.  Psychiatric/Behavioral: Negative for depression. The patient is not nervous/anxious and does not have insomnia.   All other systems reviewed and are negative.    Objective:  Physical Exam Vitals signs reviewed.  Constitutional:      General: She is not in acute distress.    Appearance: She is well-developed.  HENT:     Head: Normocephalic and atraumatic.  Eyes:     General: No scleral icterus.    Conjunctiva/sclera: Conjunctivae normal.     Pupils: Pupils are equal, round, and reactive to light.  Neck:     Musculoskeletal: Neck supple.     Vascular: No JVD.     Trachea: No tracheal deviation.  Cardiovascular:     Rate and Rhythm: Normal rate and regular rhythm.     Heart sounds: Normal heart sounds. No murmur.  Pulmonary:     Effort: Pulmonary effort is normal. No tachypnea, accessory muscle usage or respiratory distress.     Breath sounds: Normal   breath sounds. No stridor. No wheezing, rhonchi or rales.  Abdominal:     General: Bowel sounds are normal. There is no distension.     Palpations: Abdomen is soft.     Tenderness: There is no abdominal tenderness.  Musculoskeletal:        General: No tenderness.  Lymphadenopathy:     Cervical: No cervical adenopathy.  Skin:    General: Skin  is warm and dry.     Capillary Refill: Capillary refill takes less than 2 seconds.     Findings: No rash.  Neurological:     Mental Status: She is alert and oriented to person, place, and time.  Psychiatric:        Behavior: Behavior normal.      Vitals:   09/07/19 1150  BP: 126/88  Pulse: 69  Temp: (!) 97.5 F (36.4 C)  TempSrc: Temporal  SpO2: 97%  Weight: 150 lb 12.8 oz (68.4 kg)  Height: 5' 2" (1.575 m)   97% on RA BMI Readings from Last 3 Encounters:  09/07/19 27.58 kg/m  08/10/19 27.62 kg/m  07/25/19 27.69 kg/m   Wt Readings from Last 3 Encounters:  09/07/19 150 lb 12.8 oz (68.4 kg)  08/10/19 151 lb (68.5 kg)  07/25/19 151 lb 6 oz (68.7 kg)     CBC    Component Value Date/Time   WBC 7.2 07/25/2019 1512   RBC 4.34 07/25/2019 1512   HGB 12.9 07/25/2019 1512   HCT 38.5 07/25/2019 1512   PLT 207.0 07/25/2019 1512   MCV 88.8 07/25/2019 1512   MCH 29.7 06/29/2018 2130   MCHC 33.6 07/25/2019 1512   RDW 13.8 07/25/2019 1512   LYMPHSABS 1.6 07/25/2019 1512   MONOABS 0.4 07/25/2019 1512   EOSABS 0.1 07/25/2019 1512   BASOSABS 0.0 07/25/2019 1512    Chest Imaging: No recent chest imaging  Pulmonary Functions Testing Results: No flowsheet data found.  FeNO: None   Pathology: None   Echocardiogram: None   Heart Catheterization: None     Assessment & Plan:     ICD-10-CM   1. Mild intermittent asthma with acute exacerbation  J45.21   2. Environmental allergies  Z91.09   3. Tachycardia  R00.0     Discussion:  This is a 73 year old female, mild intermittent asthma.  Has been doing well on daily Symbicort.  However has noticed increased in cardiac symptoms, tachycardia and irregular heartbeat.  She does have documented atrial fibrillation is on Eliquis.  She does not like the symptoms of palpitations.  This is very worrisome to her. I believe this is likely related to the formoterol that is in the Symbicort.  She has not been using albuterol at all  recently.  Plan: I did explain today that albuterol use would be her higher risk for stimulating her atrial fibrillation. However if she is only using this periodically for symptom management it may be okay. She does have lots of hesitancy about being on a maintenance therapy. I recommended holding off on any use of the Symbicort. She can use the Symbicort if her symptoms return as needed, every 12 hours, 1 to 2 puffs daily If this continues to cause problems we can just de-escalate to a inhaled steroid alone which I think would have the lowest risk of developing atrial fibrillation symptoms. She can use her albuterol as needed for shortness of breath and wheezing.  Patient to return to clinic in 6 months or as needed  Greater than 50% of this  patient's 25-minute of visit was spent face-to-face discussing above recommendations and treatment plan.    Current Outpatient Medications:  .  acetaminophen (TYLENOL) 500 MG tablet, Take 500 mg by mouth as needed (Takes it about 5 times a week). , Disp: , Rfl:  .  Albuterol Sulfate (PROAIR HFA IN), Inhale into the lungs., Disp: , Rfl:  .  amLODipine (NORVASC) 5 MG tablet, Take 5 mg by mouth daily. , Disp: , Rfl:  .  Calcium Citrate (CITRACAL PO), Take 2 tablets by mouth daily. 500 mg Calcium & 1000 units of Vitamin D, Disp: , Rfl:  .  chlorpheniramine (CHLOR-TRIMETON) 4 MG tablet, Take 4 mg by mouth every 6 (six) hours as needed for allergies., Disp: , Rfl:  .  ELIQUIS 5 MG TABS tablet, TAKE 1 TABLET BY MOUTH TWICE DAILY., Disp: 60 tablet, Rfl: 0 .  esomeprazole (NEXIUM) 20 MG capsule, Take 20 mg by mouth daily. , Disp: , Rfl:  .  metoprolol succinate (TOPROL-XL) 50 MG 24 hr tablet, Take 50 mg by mouth daily., Disp: , Rfl:  .  NONFORMULARY OR COMPOUNDED ITEM, Estradiol vaginal cream 1 applicator  1.97% insert twice weekly, Disp: 24 each, Rfl: 3 .  pravastatin (PRAVACHOL) 40 MG tablet, Take 1 tablet (40 mg total) by mouth daily., Disp: 90 tablet,  Rfl: 3 .  SUPREP BOWEL PREP KIT 17.5-3.13-1.6 GM/177ML SOLN, Take 1 kit by mouth as directed. For colonoscopy prep, Disp: 354 mL, Rfl: 0   Garner Nash, DO [facility] 09/07/2019 12:00 PM

## 2019-09-12 ENCOUNTER — Telehealth: Payer: Self-pay | Admitting: Internal Medicine

## 2019-09-12 NOTE — Telephone Encounter (Signed)
Pls call pt, she stated that she needs to give you an update on her asthma.

## 2019-09-13 NOTE — Telephone Encounter (Signed)
I agree, can please Jan recall if you wish so that we can remind her/check on her if not scheduled.

## 2019-09-13 NOTE — Telephone Encounter (Signed)
I have spoken to patient. She indicates that although her asthma is under control, she is now having some issues with her atrial fibrillation due to steroids taken for the asthma. She just wanted to update [place].   Dr Hilarie Fredrickson, would you like me just to put in a recall for her? I told her just to give [place] a call when things got better for her. I think she will do that.

## 2019-09-13 NOTE — Telephone Encounter (Signed)
Done

## 2019-09-26 ENCOUNTER — Other Ambulatory Visit: Payer: Self-pay | Admitting: Cardiology

## 2019-09-29 ENCOUNTER — Other Ambulatory Visit: Payer: Self-pay

## 2019-09-29 ENCOUNTER — Encounter: Payer: Medicare Other | Admitting: Gynecology

## 2019-09-30 ENCOUNTER — Encounter: Payer: Self-pay | Admitting: Gynecology

## 2019-09-30 ENCOUNTER — Telehealth: Payer: Self-pay | Admitting: *Deleted

## 2019-09-30 ENCOUNTER — Ambulatory Visit (INDEPENDENT_AMBULATORY_CARE_PROVIDER_SITE_OTHER): Payer: Medicare Other | Admitting: Gynecology

## 2019-09-30 VITALS — BP 134/84 | Ht 61.5 in | Wt 154.0 lb

## 2019-09-30 DIAGNOSIS — Z01419 Encounter for gynecological examination (general) (routine) without abnormal findings: Secondary | ICD-10-CM | POA: Diagnosis not present

## 2019-09-30 DIAGNOSIS — N952 Postmenopausal atrophic vaginitis: Secondary | ICD-10-CM

## 2019-09-30 DIAGNOSIS — M8580 Other specified disorders of bone density and structure, unspecified site: Secondary | ICD-10-CM

## 2019-09-30 DIAGNOSIS — N950 Postmenopausal bleeding: Secondary | ICD-10-CM

## 2019-09-30 MED ORDER — NONFORMULARY OR COMPOUNDED ITEM: 4 refills | Status: DC

## 2019-09-30 NOTE — Patient Instructions (Signed)
Follow-up for the ultrasound as scheduled. 

## 2019-09-30 NOTE — Progress Notes (Signed)
    Meagan Key [birth year] [id number]        73 y.o.  [id number] for breast and pelvic exam.  Notes occasional spotting over the past year usually associated with activity.  No pain.  Was evaluated for bleeding 05/2017 with a negative sonohysterogram and negative endometrial biopsy.  Currently using estradiol vaginal cream twice weekly for recurrent UTIs and has done well with this.  Pap smear 2019 negative.  Past medical history,surgical history, problem list, medications, allergies, family history and social history were all reviewed and documented as reviewed in the EPIC chart.  ROS:  Performed with pertinent positives and negatives included in the history, assessment and plan.   Additional significant findings : None   Exam: [name] assistant Vitals:   09/30/19 1130  BP: 134/84  Weight: 154 lb (69.9 kg)  Height: 5' 1.5" (1.562 m)   Body mass index is 28.63 kg/m.  General appearance:  Normal affect, orientation and appearance. Skin: Grossly normal HEENT: Without gross lesions.  No cervical or supraclavicular adenopathy. Thyroid normal.  Lungs:  Clear without wheezing, rales or rhonchi Cardiac: RR, without RMG Abdominal:  Soft, nontender, without masses, guarding, rebound, organomegaly or hernia Breasts:  Examined lying and sitting without masses, retractions, discharge or axillary adenopathy. Pelvic:  Ext, BUS, Vagina: With atrophic changes  Cervix: With atrophic changes  Uterus: Grossly normal size midline mobile nontender  Adnexa: Without masses or tenderness    Anus and perineum: Normal   Rectovaginal: Normal sphincter tone without palpated masses or tenderness.    Assessment/Plan:  73 y.o. [id number] female for breast and pelvic exam   1. Postmenopausal spotting.  Last evaluation 2018 consistent with atrophic changes.  Recommend sonohysterogram now to relook at the endometrium and she agrees to arrange.  She understands that I am retiring and she may arrange it with my  partner if my schedule is not available.  Differential reviewed to include significant pathology. 2. Postmenopausal.  No significant menopausal symptoms.  Using estradiol cream twice weekly for recurrent UTIs and doing well with this.  We will continue for now.  We have discussed in the past the risks of absorption with systemic effects to include endometrial stimulation, thrombosis in the breast cancer issue.  Will refill when needs more. 3. Colonoscopy 2018.  Repeat at their recommended interval. 4. Mammography 11/2018.  Continue with annual mammography when due.  Breast exam normal today. 5. Osteopenia.  Being followed by Dr. Virgina Jock. 6. Pap smear 2019.  No Pap smear done today.  No history of significant abnormal Pap smears. 7. Health maintenance.  No routine lab work done as patient does this elsewhere.  Follow-up for sonohysterogram.  Follow-up in 1 year for annual exam   [name] MD, 12:09 PM 09/30/2019

## 2019-09-30 NOTE — Telephone Encounter (Signed)
Okay to refill estradiol x1 year

## 2019-09-30 NOTE — Telephone Encounter (Signed)
Rx called in 

## 2019-09-30 NOTE — Telephone Encounter (Signed)
Patient called requesting refill for estradiol vaginal cream 0.02% to [facility].  Refills x 1 year?

## 2019-10-03 DIAGNOSIS — H268 Other specified cataract: Secondary | ICD-10-CM | POA: Diagnosis not present

## 2019-10-03 DIAGNOSIS — H40023 Open angle with borderline findings, high risk, bilateral: Secondary | ICD-10-CM | POA: Diagnosis not present

## 2019-10-06 ENCOUNTER — Other Ambulatory Visit: Payer: Medicare Other

## 2019-10-06 ENCOUNTER — Ambulatory Visit: Payer: Medicare Other | Admitting: Gynecology

## 2019-10-17 ENCOUNTER — Encounter: Payer: Self-pay | Admitting: Internal Medicine

## 2019-10-17 ENCOUNTER — Telehealth: Payer: Self-pay | Admitting: Internal Medicine

## 2019-10-17 NOTE — Telephone Encounter (Signed)
Error

## 2019-10-18 ENCOUNTER — Other Ambulatory Visit: Payer: Self-pay

## 2019-10-19 ENCOUNTER — Ambulatory Visit (INDEPENDENT_AMBULATORY_CARE_PROVIDER_SITE_OTHER): Payer: Medicare Other | Admitting: Gynecology

## 2019-10-19 ENCOUNTER — Encounter: Payer: Self-pay | Admitting: Gynecology

## 2019-10-19 ENCOUNTER — Other Ambulatory Visit: Payer: Self-pay | Admitting: Gynecology

## 2019-10-19 ENCOUNTER — Ambulatory Visit (INDEPENDENT_AMBULATORY_CARE_PROVIDER_SITE_OTHER): Payer: Medicare Other

## 2019-10-19 VITALS — BP 126/80

## 2019-10-19 DIAGNOSIS — N950 Postmenopausal bleeding: Secondary | ICD-10-CM

## 2019-10-19 NOTE — Addendum Note (Signed)
Addended by: Lorine Bears on: 10/19/2019 02:36 PM   Modules accepted: Orders

## 2019-10-19 NOTE — Progress Notes (Signed)
    Meagan Key 1946-08-18 [id number]        73 y.o.  G2P2002 presents for sonohysterogram.  History of spotting on and off several episodes over the past year.  Had negative sonohysterogram 2018 for similar spotting.  Past medical history,surgical history, problem list, medications, allergies, family history and social history were all reviewed and documented in the EPIC chart.  Directed ROS with pertinent positives and negatives documented in the history of present illness/assessment and plan.  Exam: Ivin Booty assistant Vitals:   10/19/19 1151  BP: 126/80   General appearance:  Normal Abdomen soft nontender without mass guarding rebound Pelvic external BUS vagina with atrophic changes.  Cervix with atrophic changes.  Uterus grossly normal size midline mobile nontender.  Adnexa without masses or tenderness.  Ultrasound transvaginal shows uterus normal size and echotexture.  Endometrial echo thin at 3.26 mm.  Right ovary surgically absent.  Adnexa without abnormalities.  Left ovary small and atrophic in appearance.  Cul-de-sac negative  Sonohysterogram performed, sterile technique, single-tooth tenaculum anterior lip stabilization, catheter placement with good distention and no abnormalities seen.  Endometrial biopsy taken.   Assessment/Plan:  73 y.o. [id number] with episodes of postmenopausal spotting over the past year.  Endometrium appears atrophic.  Patient will follow-up for biopsy results.  We discussed possibilities to include no specimen retrieved which would be consistent with the thin endometrium on ultrasound.  She understands that I am retiring and that another physician will review her pathology.  I asked her to call the end of next week to follow-up on biopsy results to make sure that it was seen and a decision made.   [name] MD, 12:47 PM 10/19/2019

## 2019-10-19 NOTE — Patient Instructions (Signed)
Call in 1 week if you do not hear about the biopsy results from the ultrasound.

## 2019-10-24 NOTE — Telephone Encounter (Signed)
Please reschedule OV since on eliquis.

## 2019-11-15 ENCOUNTER — Ambulatory Visit: Payer: Medicare Other

## 2019-11-17 ENCOUNTER — Other Ambulatory Visit: Payer: Self-pay | Admitting: Internal Medicine

## 2019-11-17 DIAGNOSIS — Z1231 Encounter for screening mammogram for malignant neoplasm of breast: Secondary | ICD-10-CM

## 2019-12-13 ENCOUNTER — Telehealth: Payer: Self-pay | Admitting: *Deleted

## 2019-12-13 ENCOUNTER — Ambulatory Visit (INDEPENDENT_AMBULATORY_CARE_PROVIDER_SITE_OTHER): Payer: Medicare Other | Admitting: Internal Medicine

## 2019-12-13 ENCOUNTER — Encounter: Payer: Self-pay | Admitting: Internal Medicine

## 2019-12-13 ENCOUNTER — Other Ambulatory Visit: Payer: Self-pay

## 2019-12-13 VITALS — BP 140/70 | HR 74 | Temp 98.4°F | Ht 62.0 in | Wt 152.0 lb

## 2019-12-13 DIAGNOSIS — Z86010 Personal history of colonic polyps: Secondary | ICD-10-CM

## 2019-12-13 DIAGNOSIS — Z7901 Long term (current) use of anticoagulants: Secondary | ICD-10-CM | POA: Diagnosis not present

## 2019-12-13 NOTE — Telephone Encounter (Signed)
Pharm please address eliquis thanks 

## 2019-12-13 NOTE — Telephone Encounter (Signed)
Left message for patient to call back  

## 2019-12-13 NOTE — Telephone Encounter (Signed)
Patient has been advised that cardiology has given okay for her to hold eliquis 2 days prior to her upcoming procedure. She verbalizes clear understanding of this information.

## 2019-12-13 NOTE — Progress Notes (Signed)
a 

## 2019-12-13 NOTE — Telephone Encounter (Signed)
Patient with diagnosis of ATRIAL FIBRILLATION  on ELIQUIS for anticoagulation.    Procedure: COLONOSCOPY Date of procedure: 12/27/2019  CHADS2-VASc score of  4 (HTN, AGE X 2,  female)  CrCl = 60ml/min Platelet count = 207  Per office protocol, patient can hold ELIQUIS for 2 days prior to procedure.   Patient will not need bridging with Lovenox (enoxaparin) around procedure.  Guliana Weyandt Rodriguez-Guzman PharmD, BCPS, [place] [address] 12/13/2019 10:46 AM

## 2019-12-13 NOTE — Patient Instructions (Signed)
You have been scheduled for a colonoscopy. Please follow written instructions given to you at your visit today.  _______________________________________________________ If you are age 76 or older, your body mass index should be between 23-30. Your Body mass index is 27.8 kg/m. If this is out of the aforementioned range listed, please consider follow up with your Primary Care Provider.  If you are age 88 or younger, your body mass index should be between 19-25. Your Body mass index is 27.8 kg/m. If this is out of the aformentioned range listed, please consider follow up with your Primary Care Provider.  ________________________________________________________ Due to recent changes in healthcare laws, you may see the results of your imaging and laboratory studies on MyChart before your provider has had a chance to review them.  We understand that in some cases there may be results that are confusing or concerning to you. Not all laboratory results come back in the same time frame and the provider may be waiting for multiple results in order to interpret others.  Please give [place] 48 hours in order for your provider to thoroughly review all the results before contacting the office for clarification of your results.

## 2019-12-13 NOTE — Progress Notes (Signed)
HPI: Meagan Key is a 74 year old female with a history of adenomatous colon polyps, small internal hemorrhoids, family history of colon cancer, A. fib on Eliquis, history of asthma, sleep apnea, hypertension hyperlipidemia who is here for follow-up.  She is here alone today.  I saw her on 07/25/2019 at which point we scheduled a surveillance colonoscopy.  This had to be canceled due to flare of her asthma.  Since this time she also had another episode of A. fib which has resolved.  She is feeling well.  She does occasionally see red blood with wiping which she is attributed to hemorrhoids.  No further changes to bowel habits since I saw her last.  She did see Dr. Phineas Key to rule out vaginal bleeding and she had a normal transvaginal ultrasound.  Past Medical History:  Diagnosis Date  . Allergy   . Arthritis    hands  . Asthma    rarely uses inhaler  . Atrial fibrillation ([place])   . Cataracts, bilateral   . Chicken pox   . Endometriosis   . Esophageal dysfunction   . GERD (gastroesophageal reflux disease)   . Glaucoma    surg for preventive for NARROW ANGLE  . Hematuria   . Hx of colonic polyp 06-12-2003   Colonoscopy Dr Meagan Key  . Hyperlipemia   . Hypertension   . Osteopenia 05/2013   T score -2.3 FRAX 13%/2.7%  . Sleep apnea    Borderline.   Did not require sleeping.    . Thyroid disease    nodule on thyroid  . Tubular adenoma of colon     Past Surgical History:  Procedure Laterality Date  . APPENDECTOMY    . BREAST CYST EXCISION  AGE 28, 23, 83   bilateral  . BREAST EXCISIONAL BIOPSY Bilateral   . CATARACT EXTRACTION    . CATARACT EXTRACTION     x2  . COLONOSCOPY    . DILATATION & CURETTAGE/HYSTEROSCOPY WITH TRUECLEAR N/A 02/22/2014   Procedure: DILATATION & CURETTAGE/HYSTEROSCOPY WITH TRUCLEAR;  Surgeon: Meagan Auerbach, MD;  Location: [facility];  Service: Gynecology;  Laterality: N/A;  . ESOPHAGEAL MANOMETRY N/A 11/29/2012   Procedure: ESOPHAGEAL MANOMETRY (EM);   Surgeon: Meagan Feil, MD;  Location: [facility];  Service: Endoscopy;  Laterality: N/A;  . 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 LAPAROSCOPIC ENDOMETRIOSIS FULGURATION  1972  . OOPHORECTOMY  1969   RSO  . POLYPECTOMY    . THYROIDECTOMY, PARTIAL  AGE 28  . TONSILLECTOMY    . UPPER GASTROINTESTINAL ENDOSCOPY  2013    Outpatient Medications Prior to Visit  Medication Sig Dispense Refill  . acetaminophen (TYLENOL) 500 MG tablet Take 500 mg by mouth as needed (Takes it about 5 times a week).     . Albuterol Sulfate (PROAIR HFA IN) Inhale into the lungs.    Marland Kitchen amLODipine (NORVASC) 5 MG tablet Take 5 mg by mouth daily.     . Calcium Citrate (CITRACAL PO) Take 2 tablets by mouth daily. 500 mg Calcium & 1000 units of Vitamin D    . chlorpheniramine (CHLOR-TRIMETON) 4 MG tablet Take 4 mg by mouth every 6 (six) hours as needed for allergies.    Marland Kitchen ELIQUIS 5 MG TABS tablet TAKE 1 TABLET BY MOUTH TWICE DAILY. 60 tablet 3  . esomeprazole (NEXIUM) 20 MG capsule Take 20 mg by mouth daily.     . metoprolol succinate (TOPROL-XL) 50 MG 24 hr tablet Take  50 mg by mouth daily.    . NONFORMULARY OR COMPOUNDED ITEM Estradiol vaginal cream 0.02% insert 1 gram vaginally twice weekly 24 each 4  . pravastatin (PRAVACHOL) 40 MG tablet Take 1 tablet (40 mg total) by mouth daily. 90 tablet 3  . SUPREP BOWEL PREP KIT 17.5-3.13-1.6 GM/177ML SOLN Take 1 kit by mouth as directed. For colonoscopy prep 354 mL 0   No facility-administered medications prior to visit.    Allergies  Allergen Reactions  . Nitrofurantoin Shortness Of Breath and Other (See Comments)    Itching in throat   . Cortisone     A-fib  . Lisinopril Itching and Other (See Comments)    SOB and itching is in throat  . Symbicort [Budesonide-Formoterol Fumarate]     A-fib    Family History  Problem Relation Age of Onset  . Colon cancer Mother 35  . Alcohol abuse Mother   . Mental illness Mother   . Lung  cancer Father        smoker  . Alcohol abuse Father   . Hypertension Father   . Colon cancer Father 64  . Breast cancer Maternal Aunt        Age 16  . Heart attack Paternal Grandmother   . Other Cousin        mother side-vocal cord cancer    Social History   Tobacco Use  . Smoking status: Former Smoker    Packs/day: 0.30    Years: 25.00    Pack years: 7.50    Types: Cigarettes    Quit date: 09/14/1984    Years since quitting: 35.2  . Smokeless tobacco: Never Used  Substance Use Topics  . Alcohol use: Yes    Comment: once a week  . Drug use: No    ROS: As per history of present illness, otherwise negative  BP 140/70   Pulse 74   Temp 98.4 F (36.9 C)   Ht 5' 2"  (1.575 m)   Wt 152 lb (68.9 kg)   BMI 27.80 kg/m  Constitutional: Well-developed and well-nourished. No distress.   RELEVANT LABS AND IMAGING: CBC    Component Value Date/Time   WBC 7.2 07/25/2019 1512   RBC 4.34 07/25/2019 1512   HGB 12.9 07/25/2019 1512   HCT 38.5 07/25/2019 1512   PLT 207.0 07/25/2019 1512   MCV 88.8 07/25/2019 1512   MCH 29.7 06/29/2018 2130   MCHC 33.6 07/25/2019 1512   RDW 13.8 07/25/2019 1512   LYMPHSABS 1.6 07/25/2019 1512   MONOABS 0.4 07/25/2019 1512   EOSABS 0.1 07/25/2019 1512   BASOSABS 0.0 07/25/2019 1512    CMP     Component Value Date/Time   NA 141 07/25/2019 1512   K 3.9 07/25/2019 1512   CL 106 07/25/2019 1512   CO2 29 07/25/2019 1512   GLUCOSE 127 (H) 07/25/2019 1512   BUN 23 07/25/2019 1512   CREATININE 0.83 07/25/2019 1512   CREATININE 0.87 12/30/2013 0924   CALCIUM 9.0 07/25/2019 1512   PROT 7.0 07/25/2019 1512   ALBUMIN 4.2 07/25/2019 1512   AST 16 07/25/2019 1512   ALT 13 07/25/2019 1512   ALKPHOS 52 07/25/2019 1512   BILITOT 0.3 07/25/2019 1512   GFRNONAA 48 (L) 06/29/2018 2130   GFRAA 56 (L) 06/29/2018 2130    ASSESSMENT/PLAN: 74 year old female with a history of adenomatous colon polyps, small internal hemorrhoids, family history of  colon cancer, A. fib on Eliquis, history of asthma, sleep apnea, hypertension hyperlipidemia who  is here for follow-up.   1.  Personal history of adenomatous colon polyp/family history of colon cancer/chronic anticoagulation --we will reschedule colonoscopy.  She already has her bowel preparation. --Colonoscopy in the [facility]  Will hold Eliquis 2 days prior to endoscopic procedures - will instruct when and how to resume after procedure. Benefits and risks of procedure explained including risks of bleeding, perforation, infection, missed lesions, reactions to medications and possible need for hospitalization and surgery for complications. Additional rare but Key risk of stroke or other vascular clotting events off Eliquis also explained and need to seek urgent help if any signs of these problems occur. Will communicate by phone or EMR with patient's  prescribing provider to confirm that holding Eliquis is reasonable in this case.   20 minutes total spent today including patient facing time, coordination of care, reviewing medical history/procedures/pertinent radiology studies, and documentation of the encounter.     [place]:[facility], Jenny Reichmann, [place],  [place] [address]

## 2019-12-13 NOTE — Telephone Encounter (Signed)
Request for surgical clearance:     Endoscopy Procedure  What type of surgery is being performed?     colon  When is this surgery scheduled?     12/27/2019  What type of clearance is required ?   Pharmacy  Are there any medications that need to be held prior to surgery and how long? Eliquis, 2 days  Practice name and name of physician performing surgery?      [facility]  What is your office phone and fax number?      Phone- [phone]  [phone]  Anesthesia type (None, local, MAC, general) ?       [facility]

## 2019-12-26 ENCOUNTER — Ambulatory Visit: Payer: Medicare Other

## 2019-12-27 ENCOUNTER — Encounter: Payer: Self-pay | Admitting: Internal Medicine

## 2019-12-27 ENCOUNTER — Ambulatory Visit (AMBULATORY_SURGERY_CENTER): Payer: Medicare Other | Admitting: Internal Medicine

## 2019-12-27 ENCOUNTER — Other Ambulatory Visit: Payer: Self-pay

## 2019-12-27 VITALS — BP 136/76 | HR 61 | Temp 97.1°F | Resp 15 | Ht 62.0 in | Wt 152.0 lb

## 2019-12-27 DIAGNOSIS — Z1211 Encounter for screening for malignant neoplasm of colon: Secondary | ICD-10-CM | POA: Diagnosis not present

## 2019-12-27 DIAGNOSIS — Z86010 Personal history of colonic polyps: Secondary | ICD-10-CM | POA: Diagnosis not present

## 2019-12-27 MED ORDER — SODIUM CHLORIDE 0.9 % IV SOLN: 500.0000 mL | Freq: Once | INTRAVENOUS | Status: DC

## 2019-12-27 NOTE — Progress Notes (Signed)
Pt's states no medical or surgical changes since previsit or office visit.  [place]

## 2019-12-27 NOTE — Op Note (Signed)
[facility] Patient Name: Meagan Key Procedure Date: 12/27/2019 10:56 AM MRN: [id number] Endoscopist: Jerene Bears , MD Age: 74 Referring MD:  Date of Birth: [birth year] Gender: Female Account #: [id number] Procedure:                Colonoscopy Indications:              High risk colon cancer surveillance: Personal                            history of multiple (3 or more) adenomas, Family                            history of colon cancer in multiple first-degree                            relatives, Last colonoscopy: January 2018 Medicines:                Monitored Anesthesia Care Procedure:                Pre-Anesthesia Assessment:                           - Prior to the procedure, a History and Physical                            was performed, and patient medications and                            allergies were reviewed. The patient's tolerance of                            previous anesthesia was also reviewed. The risks                            and benefits of the procedure and the sedation                            options and risks were discussed with the patient.                            All questions were answered, and informed consent                            was obtained. Prior Anticoagulants: The patient has                            taken Eliquis (apixaban), last dose was 2 days                            prior to procedure. ASA Grade Assessment: II - A                            patient with mild systemic disease. After reviewing  the risks and benefits, the patient was deemed in                            satisfactory condition to undergo the procedure.                           After obtaining informed consent, the colonoscope                            was passed under direct vision. Throughout the                            procedure, the patient's blood pressure, pulse, and                            oxygen saturations  were monitored continuously. The                            Colonoscope was introduced through the anus and                            advanced to the cecum, identified by appendiceal                            orifice and ileocecal valve. The colonoscopy was                            performed without difficulty. The patient tolerated                            the procedure well. The quality of the bowel                            preparation was excellent. The ileocecal valve,                            appendiceal orifice, and rectum were photographed. Scope In: 11:15:18 AM Scope Out: 11:28:41 AM Scope Withdrawal Time: 0 hours 10 minutes 19 seconds  Total Procedure Duration: 0 hours 13 minutes 23 seconds  Findings:                 The digital rectal exam was normal.                           Two small angioectasias with typical arborization                            were found in the cecum.                           The entire examined colon otherwise appeared normal.                           Internal hemorrhoids were found during  retroflexion. The hemorrhoids were small. Complications:            No immediate complications. Estimated Blood Loss:     Estimated blood loss: none. Impression:               - Small cecal angioectasias.                           - Otherwise, the entire examined colon is normal.                           - Small internal hemorrhoids.                           - No specimens collected. Recommendation:           - Patient has a contact number available for                            emergencies. The signs and symptoms of potential                            delayed complications were discussed with the                            patient. Return to normal activities tomorrow.                            Written discharge instructions were provided to the                            patient.                           - Resume  previous diet.                           - Continue present medications.                           - Okay to resume Eliquis today at previous dose per                            prescribing provider.                           - Repeat colonoscopy in 5 years for surveillance. Jerene Bears, MD 12/27/2019 11:32:37 AM This report has been signed electronically.

## 2019-12-27 NOTE — Patient Instructions (Signed)
Please, read all of the handouts given to you by your recovery room nurse.  You may resume your Eliquis today at the previous dose. You will need another colonoscopy in 5 years.  YOU HAD AN ENDOSCOPIC PROCEDURE TODAY AT [facility]:   Refer to the procedure report that was given to you for any specific questions about what was found during the examination.  If the procedure report does not answer your questions, please call your gastroenterologist to clarify.  If you requested that your care partner not be given the details of your procedure findings, then the procedure report has been included in a sealed envelope for you to review at your convenience later.  YOU SHOULD EXPECT: Some feelings of bloating in the abdomen. Passage of more gas than usual.  Walking can help get rid of the air that was put into your GI tract during the procedure and reduce the bloating. If you had a lower endoscopy (such as a colonoscopy or flexible sigmoidoscopy) you may notice spotting of blood in your stool or on the toilet paper. If you underwent a bowel prep for your procedure, you may not have a normal bowel movement for a few days.  Please Note:  You might notice some irritation and congestion in your nose or some drainage.  This is from the oxygen used during your procedure.  There is no need for concern and it should clear up in a day or so.  SYMPTOMS TO REPORT IMMEDIATELY:   Following lower endoscopy (colonoscopy or flexible sigmoidoscopy):  Excessive amounts of blood in the stool  Significant tenderness or worsening of abdominal pains  Swelling of the abdomen that is new, acute  Fever of 100F or higher   For urgent or emergent issues, a gastroenterologist can be reached at any hour by calling [phone]. Do not use MyChart messaging for urgent concerns.    DIET:  We do recommend a small meal at first, but then you may proceed to your regular diet.  Drink plenty of fluids but you  should avoid alcoholic beverages for 24 hours.  ACTIVITY:  You should plan to take it easy for the rest of today and you should NOT DRIVE or use heavy machinery until tomorrow (because of the sedation medicines used during the test).    FOLLOW UP: Our staff will call the number listed on your records 48-72 hours following your procedure to check on you and address any questions or concerns that you may have regarding the information given to you following your procedure. If we do not reach you, we will leave a message.  We will attempt to reach you two times.  During this call, we will ask if you have developed any symptoms of COVID 19. If you develop any symptoms (ie: fever, flu-like symptoms, shortness of breath, cough etc.) before then, please call [phone].  If you test positive for Covid 19 in the 2 weeks post procedure, please call and report this information to [place].    If any biopsies were taken you will be contacted by phone or by letter within the next 1-3 weeks.  Please call us at [phone] if you have not heard about the biopsies in 3 weeks.    SIGNATURES/CONFIDENTIALITY: You and/or your care partner have signed paperwork which will be entered into your electronic medical record.  These signatures attest to the fact that that the information above on your After Visit Summary has been reviewed and is understood.  Full responsibility of the confidentiality of this discharge information lies with you and/or your care-partner.

## 2019-12-29 ENCOUNTER — Telehealth: Payer: Self-pay | Admitting: *Deleted

## 2019-12-29 NOTE — Telephone Encounter (Signed)
  Follow up Call-  Call back number 12/27/2019  Post procedure Call Back phone  # [phone]  Permission to leave phone message Yes  Some recent data might be hidden     Patient questions:  Do you have a fever, pain , or abdominal swelling? No. Pain Score  0 *  Have you tolerated food without any problems? Yes.    Have you been able to return to your normal activities? Yes.    Do you have any questions about your discharge instructions: Diet   No. Medications  No. Follow up visit  No.  Do you have questions or concerns about your Care? No.  Actions: * If pain score is 4 or above: No action needed, pain <4.   1. Have you developed a fever since your procedure? no  2.   Have you had an respiratory symptoms (SOB or cough) since your procedure? no  3.   Have you tested positive for COVID 19 since your procedure no  4.   Have you had any family members/close contacts diagnosed with the COVID 19 since your procedure?  no   If yes to any of these questions please route to Joylene John, RN and Alphonsa Gin, Therapist, sports.

## 2020-01-02 ENCOUNTER — Ambulatory Visit: Payer: Medicare Other | Admitting: Cardiology

## 2020-01-05 DIAGNOSIS — Z7189 Other specified counseling: Secondary | ICD-10-CM | POA: Insufficient documentation

## 2020-01-05 NOTE — Progress Notes (Signed)
Cardiology Office Note   Date:  01/06/2020   ID:  Meagan Key, DOB 08/01/1946, MRN [id number]  PCP:  [name], MD  Cardiologist:   Minus Breeding, MD   Chief Complaint  Patient presents with  . Atrial Fibrillation      History of Present Illness: Meagan Key is a 74 y.o. female who presents for followup of atrial fib.  She had atrial fib with RVR in Sept and was in the ED.  She converted spontaneously.  She was followed in the atrial fib clinic.    She was started on Eliquis.  She did have some bleeding gums.  Of note she did have chest pain during her RVR.  She had a low risk POET (Plain Old Exercise Treadmill).    Since I saw her she has been doing well.  She had one episode of what she thought was hours worth of atrial fibrillation when she was being treated with Symbicort.  However, this was self-limited.  Otherwise she denies any new cardiovascular symptoms. The patient denies any new symptoms such as chest discomfort, neck or arm discomfort. There has been no new shortness of breath, PND or orthopnea. There have been no reported palpitations, presyncope or syncope.   Past Medical History:  Diagnosis Date  . Allergy   . Arthritis    hands  . Asthma    rarely uses inhaler  . Atrial fibrillation ([place])   . Cataracts, bilateral   . Chicken pox   . Endometriosis   . Esophageal dysfunction   . GERD (gastroesophageal reflux disease)   . Glaucoma    surg for preventive for NARROW ANGLE  . Hematuria   . Hx of colonic polyp 06-12-2003   Colonoscopy Dr Amedeo Plenty  . Hyperlipemia   . Hypertension   . Osteopenia 05/2013   T score -2.3 FRAX 13%/2.7%  . Sleep apnea    Borderline.   Did not require sleeping.    . Thyroid disease    nodule on thyroid  . Tubular adenoma of colon     Past Surgical History:  Procedure Laterality Date  . APPENDECTOMY    . BREAST CYST EXCISION  AGE 60, 23, 20   bilateral  . BREAST EXCISIONAL BIOPSY Bilateral   . CATARACT EXTRACTION     . CATARACT EXTRACTION     x2  . COLONOSCOPY    . DILATATION & CURETTAGE/HYSTEROSCOPY WITH TRUECLEAR N/A 02/22/2014   Procedure: DILATATION & CURETTAGE/HYSTEROSCOPY WITH TRUCLEAR;  Surgeon: [name], MD;  Location: [facility];  Service: Gynecology;  Laterality: N/A;  . ESOPHAGEAL MANOMETRY N/A 11/29/2012   Procedure: ESOPHAGEAL MANOMETRY (EM);  Surgeon: Sable Feil, MD;  Location: [facility];  Service: Endoscopy;  Laterality: N/A;  . 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 LAPAROSCOPIC ENDOMETRIOSIS FULGURATION  1972  . OOPHORECTOMY  1969   RSO  . POLYPECTOMY    . THYROIDECTOMY, PARTIAL  AGE 79  . TONSILLECTOMY    . UPPER GASTROINTESTINAL ENDOSCOPY  2013     Current Outpatient Medications  Medication Sig Dispense Refill  . acetaminophen (TYLENOL) 500 MG tablet Take 500 mg by mouth as needed (Takes it about 5 times a week).     . Albuterol Sulfate (PROAIR HFA IN) Inhale into the lungs.    Marland Kitchen amLODipine (NORVASC) 5 MG tablet Take 5 mg by mouth daily.     . Calcium Citrate (CITRACAL PO) Take 2 tablets  by mouth daily. 500 mg Calcium & 1000 units of Vitamin D    . chlorpheniramine (CHLOR-TRIMETON) 4 MG tablet Take 4 mg by mouth every 6 (six) hours as needed for allergies.    Marland Kitchen ELIQUIS 5 MG TABS tablet TAKE 1 TABLET BY MOUTH TWICE DAILY. 60 tablet 3  . esomeprazole (NEXIUM) 20 MG capsule Take 20 mg by mouth daily.     . metoprolol succinate (TOPROL-XL) 50 MG 24 hr tablet Take 50 mg by mouth daily.    . NONFORMULARY OR COMPOUNDED ITEM Estradiol vaginal cream 0.02% insert 1 gram vaginally twice weekly 24 each 4  . pravastatin (PRAVACHOL) 40 MG tablet Take 1 tablet (40 mg total) by mouth daily. 90 tablet 3   No current facility-administered medications for this visit.    Allergies:   Nitrofurantoin, Cortisone, Lisinopril, and Symbicort [budesonide-formoterol fumarate]    ROS:  Please see the history of present illness.   Otherwise, review of  systems are positive for none..   All other systems are reviewed and negative.    PHYSICAL EXAM: VS:  BP 122/82   Pulse 69   Temp 98.1 F (36.7 C) (Temporal)   Resp 15   Ht 5\' 2"  (1.575 m)   Wt 153 lb 3.2 oz (69.5 kg)   SpO2 99%   BMI 28.02 kg/m  , BMI Body mass index is 28.02 kg/m. GENERAL:  Well appearing NECK:  No jugular venous distention, waveform within normal limits, carotid upstroke brisk and symmetric, no bruits, no thyromegaly LUNGS:  Clear to auscultation bilaterally CHEST:  Unremarkable HEART:  PMI not displaced or sustained,S1 and S2 within normal limits, no S3, no S4, no clicks, no rubs, no murmurs ABD:  Flat, positive bowel sounds normal in frequency in pitch, no bruits, no rebound, no guarding, no midline pulsatile mass, no hepatomegaly, no splenomegaly EXT:  2 plus pulses throughout, no edema, no cyanosis no clubbing  EKG:  EKG is not ordered today. Sinus rhythm, rate 69, axis within normal limits, intervals within normal limits, no acute ST-T wave changes.  Recent Labs: 07/25/2019: ALT 13; BUN 23; Creatinine, Ser 0.83; Hemoglobin 12.9; Platelets 207.0; Potassium 3.9; Sodium 141    Lipid Panel    Component Value Date/Time   CHOL 168 01/28/2011 0855   TRIG 41.0 01/28/2011 0855   HDL 91.90 01/28/2011 0855   CHOLHDL 2 01/28/2011 0855   VLDL 8.2 01/28/2011 0855   LDLCALC 68 01/28/2011 0855      Wt Readings from Last 3 Encounters:  01/06/20 153 lb 3.2 oz (69.5 kg)  12/27/19 152 lb (68.9 kg)  12/13/19 152 lb (68.9 kg)      Other studies Reviewed: Additional studies/ records that were reviewed today include: None Review of the above records demonstrates:  NA   ASSESSMENT AND PLAN:  ATRIAL FIB:  Ms. Meagan Key has a CHA2DS2 - VASc score of 2.   She has had rare atrial fibrillation.  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  We did talk briefly about the watchman device but I do not think there is indication at this point.   HTN: Blood pressure is at target.  No change in therapy.   DYSLIPIDEMIA: Her LDL was improved to 108 from previous of 134.   She did not tolerate other statins.  Her HDL is excellent.  No change in therapy.  COVID EDUCATION: She has been vaccinated. .   Current medicines are reviewed at length with the patient today.  The patient does not have concerns regarding medicines.  The following changes have been made:  None  Labs/ tests ordered today include:  None  Orders Placed This Encounter  Procedures  . EKG 12-Lead     Disposition:   FU with me in 12 months.     Signed, Minus Breeding, MD  01/06/2020 10:48 AM    [facility]

## 2020-01-06 ENCOUNTER — Other Ambulatory Visit: Payer: Self-pay

## 2020-01-06 ENCOUNTER — Encounter: Payer: Self-pay | Admitting: Cardiology

## 2020-01-06 ENCOUNTER — Ambulatory Visit (INDEPENDENT_AMBULATORY_CARE_PROVIDER_SITE_OTHER): Payer: Medicare Other | Admitting: Cardiology

## 2020-01-06 VITALS — BP 122/82 | HR 69 | Temp 98.1°F | Resp 15 | Ht 62.0 in | Wt 153.2 lb

## 2020-01-06 DIAGNOSIS — Z7189 Other specified counseling: Secondary | ICD-10-CM | POA: Diagnosis not present

## 2020-01-06 DIAGNOSIS — E785 Hyperlipidemia, unspecified: Secondary | ICD-10-CM

## 2020-01-06 DIAGNOSIS — I480 Paroxysmal atrial fibrillation: Secondary | ICD-10-CM | POA: Diagnosis not present

## 2020-01-06 DIAGNOSIS — I10 Essential (primary) hypertension: Secondary | ICD-10-CM | POA: Diagnosis not present

## 2020-01-06 NOTE — Patient Instructions (Signed)
Medication Instructions:  No changes *If you need a refill on your cardiac medications before your next appointment, please call your pharmacy*  Lab Work: None needed this visit  Testing/Procedures: None needed this visit  Follow-Up: At [facility], you and your health needs are our priority.  As part of our continuing mission to provide you with exceptional heart care, we have created designated Provider Care Teams.  These Care Teams include your primary Cardiologist (physician) and Advanced Practice Providers (APPs -  Physician Assistants and Nurse Practitioners) who all work together to provide you with the care you need, when you need it.  We recommend signing up for the patient portal called "MyChart".  Sign up information is provided on this After Visit Summary.  MyChart is used to connect with patients for Virtual Visits (Telemedicine).  Patients are able to view lab/test results, encounter notes, upcoming appointments, etc.  Non-urgent messages can be sent to your provider as well.   To learn more about what you can do with MyChart, go to [email or web address].com.    Your next appointment:   1 year(s)  You will receive a reminder letter in the mail two months in advance. If you don't receive a letter, please call our office to schedule the follow-up appointment.  The format for your next appointment:   In Person  Provider:   James Hochrein, MD   

## 2020-01-12 ENCOUNTER — Other Ambulatory Visit: Payer: Self-pay | Admitting: Cardiology

## 2020-01-17 ENCOUNTER — Ambulatory Visit: Payer: Medicare Other

## 2020-01-26 ENCOUNTER — Ambulatory Visit
Admission: RE | Admit: 2020-01-26 | Discharge: 2020-01-26 | Disposition: A | Discharge status: Home / Self Care | Payer: Medicare Other | Source: Ambulatory Visit | Attending: Internal Medicine | Admitting: Internal Medicine

## 2020-01-26 ENCOUNTER — Other Ambulatory Visit: Payer: Self-pay

## 2020-01-26 DIAGNOSIS — Z1231 Encounter for screening mammogram for malignant neoplasm of breast: Secondary | ICD-10-CM | POA: Diagnosis not present

## 2020-01-27 ENCOUNTER — Other Ambulatory Visit: Payer: Self-pay | Admitting: Internal Medicine

## 2020-01-27 DIAGNOSIS — R928 Other abnormal and inconclusive findings on diagnostic imaging of breast: Secondary | ICD-10-CM

## 2020-02-02 ENCOUNTER — Other Ambulatory Visit: Payer: Self-pay | Admitting: Cardiology

## 2020-02-09 ENCOUNTER — Ambulatory Visit
Admission: RE | Admit: 2020-02-09 | Discharge: 2020-02-09 | Disposition: A | Discharge status: Home / Self Care | Payer: Medicare Other | Source: Ambulatory Visit | Attending: Internal Medicine | Admitting: Internal Medicine

## 2020-02-09 ENCOUNTER — Other Ambulatory Visit: Payer: Self-pay

## 2020-02-09 ENCOUNTER — Ambulatory Visit: Payer: Medicare Other

## 2020-02-09 DIAGNOSIS — R922 Inconclusive mammogram: Secondary | ICD-10-CM | POA: Diagnosis not present

## 2020-02-09 DIAGNOSIS — R928 Other abnormal and inconclusive findings on diagnostic imaging of breast: Secondary | ICD-10-CM

## 2020-04-30 DIAGNOSIS — L438 Other lichen planus: Secondary | ICD-10-CM | POA: Diagnosis not present

## 2020-07-06 DIAGNOSIS — Z20822 Contact with and (suspected) exposure to covid-19: Secondary | ICD-10-CM | POA: Diagnosis not present

## 2020-07-28 DIAGNOSIS — Z23 Encounter for immunization: Secondary | ICD-10-CM | POA: Diagnosis not present

## 2020-08-04 ENCOUNTER — Other Ambulatory Visit: Payer: Self-pay | Admitting: Cardiology

## 2020-08-06 NOTE — Telephone Encounter (Signed)
Prescription refill request for Eliquis received. Indication:  Atrial Fibrillation Last office visit: 12/2019 Hochrein Scr: 0.83  07/2019 Age:74  Weight: 69.5 kg  Prescription refilled

## 2020-08-09 DIAGNOSIS — E049 Nontoxic goiter, unspecified: Secondary | ICD-10-CM | POA: Diagnosis not present

## 2020-08-09 DIAGNOSIS — E785 Hyperlipidemia, unspecified: Secondary | ICD-10-CM | POA: Diagnosis not present

## 2020-08-09 DIAGNOSIS — E559 Vitamin D deficiency, unspecified: Secondary | ICD-10-CM | POA: Diagnosis not present

## 2020-08-15 DIAGNOSIS — Z23 Encounter for immunization: Secondary | ICD-10-CM | POA: Diagnosis not present

## 2020-08-16 DIAGNOSIS — M8580 Other specified disorders of bone density and structure, unspecified site: Secondary | ICD-10-CM | POA: Diagnosis not present

## 2020-08-16 DIAGNOSIS — Z800 Family history of malignant neoplasm of digestive organs: Secondary | ICD-10-CM | POA: Diagnosis not present

## 2020-08-16 DIAGNOSIS — I480 Paroxysmal atrial fibrillation: Secondary | ICD-10-CM | POA: Diagnosis not present

## 2020-08-16 DIAGNOSIS — J45909 Unspecified asthma, uncomplicated: Secondary | ICD-10-CM | POA: Diagnosis not present

## 2020-08-16 DIAGNOSIS — Z0000 Encounter for general adult medical examination without abnormal findings: Secondary | ICD-10-CM | POA: Diagnosis not present

## 2020-08-16 DIAGNOSIS — N3941 Urge incontinence: Secondary | ICD-10-CM | POA: Diagnosis not present

## 2020-08-16 DIAGNOSIS — E785 Hyperlipidemia, unspecified: Secondary | ICD-10-CM | POA: Diagnosis not present

## 2020-08-16 DIAGNOSIS — I10 Essential (primary) hypertension: Secondary | ICD-10-CM | POA: Diagnosis not present

## 2020-08-16 DIAGNOSIS — G4733 Obstructive sleep apnea (adult) (pediatric): Secondary | ICD-10-CM | POA: Diagnosis not present

## 2020-08-16 DIAGNOSIS — D472 Monoclonal gammopathy: Secondary | ICD-10-CM | POA: Diagnosis not present

## 2020-08-16 DIAGNOSIS — I77819 Aortic ectasia, unspecified site: Secondary | ICD-10-CM | POA: Diagnosis not present

## 2020-08-16 DIAGNOSIS — Z7901 Long term (current) use of anticoagulants: Secondary | ICD-10-CM | POA: Diagnosis not present

## 2020-08-16 DIAGNOSIS — R82998 Other abnormal findings in urine: Secondary | ICD-10-CM | POA: Diagnosis not present

## 2020-08-27 DIAGNOSIS — L438 Other lichen planus: Secondary | ICD-10-CM | POA: Diagnosis not present

## 2020-09-10 ENCOUNTER — Emergency Department (HOSPITAL_COMMUNITY)
Admission: EM | Admit: 2020-09-10 | Discharge: 2020-09-10 | Disposition: A | Discharge status: Home / Self Care | Payer: Medicare Other | Attending: Emergency Medicine | Admitting: Emergency Medicine

## 2020-09-10 ENCOUNTER — Emergency Department (HOSPITAL_COMMUNITY): Payer: Medicare Other

## 2020-09-10 ENCOUNTER — Other Ambulatory Visit: Payer: Self-pay

## 2020-09-10 ENCOUNTER — Encounter (HOSPITAL_COMMUNITY): Payer: Self-pay | Admitting: Emergency Medicine

## 2020-09-10 DIAGNOSIS — R002 Palpitations: Secondary | ICD-10-CM | POA: Diagnosis present

## 2020-09-10 DIAGNOSIS — Z79899 Other long term (current) drug therapy: Secondary | ICD-10-CM | POA: Insufficient documentation

## 2020-09-10 DIAGNOSIS — Z87891 Personal history of nicotine dependence: Secondary | ICD-10-CM | POA: Insufficient documentation

## 2020-09-10 DIAGNOSIS — R000 Tachycardia, unspecified: Secondary | ICD-10-CM | POA: Diagnosis not present

## 2020-09-10 DIAGNOSIS — Z7901 Long term (current) use of anticoagulants: Secondary | ICD-10-CM | POA: Diagnosis not present

## 2020-09-10 DIAGNOSIS — R0600 Dyspnea, unspecified: Secondary | ICD-10-CM | POA: Insufficient documentation

## 2020-09-10 DIAGNOSIS — I10 Essential (primary) hypertension: Secondary | ICD-10-CM | POA: Insufficient documentation

## 2020-09-10 DIAGNOSIS — I471 Supraventricular tachycardia: Secondary | ICD-10-CM | POA: Insufficient documentation

## 2020-09-10 DIAGNOSIS — J45909 Unspecified asthma, uncomplicated: Secondary | ICD-10-CM | POA: Diagnosis not present

## 2020-09-10 DIAGNOSIS — R079 Chest pain, unspecified: Secondary | ICD-10-CM | POA: Diagnosis not present

## 2020-09-10 LAB — MAGNESIUM: Magnesium: 1.8 mg/dL (ref 1.7–2.4)

## 2020-09-10 LAB — TROPONIN I (HIGH SENSITIVITY): Troponin I (High Sensitivity): 12 ng/L (ref <18)

## 2020-09-10 LAB — BASIC METABOLIC PANEL
Anion gap: 9 (ref 5–15)
BUN: 28 mg/dL — ABNORMAL HIGH (ref 8–23)
CO2: 27 mmol/L (ref 22–32)
Calcium: 9.8 mg/dL (ref 8.9–10.3)
Chloride: 105 mmol/L (ref 98–111)
Creatinine, Ser: 1.02 mg/dL — ABNORMAL HIGH (ref 0.44–1.00)
GFR, Estimated: 57 mL/min — ABNORMAL LOW (ref >60)
Glucose, Bld: 124 mg/dL — ABNORMAL HIGH (ref 70–99)
Potassium: 3.9 mmol/L (ref 3.5–5.1)
Sodium: 141 mmol/L (ref 135–145)

## 2020-09-10 LAB — CBC
HCT: 39.9 % (ref 36.0–46.0)
Hemoglobin: 13.2 g/dL (ref 12.0–15.0)
MCH: 29 pg (ref 26.0–34.0)
MCHC: 33.1 g/dL (ref 30.0–36.0)
MCV: 87.7 fL (ref 80.0–100.0)
Platelets: 237 10*3/uL (ref 150–400)
RBC: 4.55 MIL/uL (ref 3.87–5.11)
RDW: 13.8 % (ref 11.5–15.5)
WBC: 7.4 10*3/uL (ref 4.0–10.5)
nRBC: 0 % (ref 0.0–0.2)

## 2020-09-10 MED ORDER — ADENOSINE 6 MG/2ML IV SOLN
6.0000 mg | Freq: Once | INTRAVENOUS | Status: AC
  Administered 2020-09-10: 6 mg via INTRAVENOUS
  Filled 2020-09-10: qty 2

## 2020-09-10 MED ORDER — LACTATED RINGERS IV BOLUS
1000.0000 mL | Freq: Once | INTRAVENOUS | Status: AC
  Administered 2020-09-10: 1000 mL via INTRAVENOUS

## 2020-09-10 MED ORDER — ADENOSINE 6 MG/2ML IV SOLN: 12.0000 mg | Freq: Once | INTRAVENOUS | Status: AC

## 2020-09-10 MED ORDER — ADENOSINE 6 MG/2ML IV SOLN
INTRAVENOUS | Status: AC
  Administered 2020-09-10: 12 mg via INTRAVENOUS
  Filled 2020-09-10: qty 4

## 2020-09-10 NOTE — ED Triage Notes (Deleted)
Pt presents to ED BIB [facility] from home. Pt c/o SOB x2d. Pt compliant w/ dialysis. Pt diabetic, non compliant w/ insulin. CBG w/ EMS in 400's.  bp - 190/70 100% - 3L  chronic 

## 2020-09-10 NOTE — ED Notes (Signed)
Pt ambulated in hallway to the bathroom, hr maintained 80-90's.

## 2020-09-10 NOTE — ED Notes (Signed)
Pt maintaining hr in the 70's with frequent PVC's post adenosine.

## 2020-09-10 NOTE — ED Notes (Signed)
ED Provider at bedside. 

## 2020-09-10 NOTE — ED Triage Notes (Addendum)
Pt presents to ED POV. Pt c/o racing HR and CP. Pt states that she took metoprolol a home and HR dropped to 50's and pain went away. Pt woken by crushing CP. Pt tachycardic in triage w/ rate of 186

## 2020-09-11 NOTE — ED Provider Notes (Signed)
[facility] Provider Note   CSN: [id number] Arrival date & time: 09/10/20  0122     History Chief Complaint  Patient presents with  . Chest Pain    Meagan Key is a 74 y.o. female.  Patient states the palpitations started around 7:00 in the evening when she was exerting himself and helping with a lot of dinner guests.  She took an extra metoprolol XL however continue to have the symptoms and chest pain but it slowly improved by about 9:00 but then her heart rate was in the 40s.  However she was comfortable so she went to sleep and that she woke up around 0 100 with palpitations again so presents here for further evaluation.  Mild chest pain with and some dyspnea but no other associated symptoms.   Chest Pain Pain location:  Substernal area Associated symptoms: dizziness, palpitations and shortness of breath   Associated symptoms: no cough, no diaphoresis, no nausea and no syncope   Palpitations Palpitations quality:  Irregular Onset quality:  Sudden Associated symptoms: chest pain, dizziness and shortness of breath   Associated symptoms: no cough, no diaphoresis, no nausea and no syncope        Past Medical History:  Diagnosis Date  . Allergy   . Arthritis    hands  . Asthma    rarely uses inhaler  . Atrial fibrillation ([place])   . Cataracts, bilateral   . Chicken pox   . Endometriosis   . Esophageal dysfunction   . GERD (gastroesophageal reflux disease)   . Glaucoma    surg for preventive for NARROW ANGLE  . Hematuria   . Hx of colonic polyp 06-12-2003   Colonoscopy Dr Amedeo Plenty  . Hyperlipemia   . Hypertension   . Osteopenia 05/2013   T score -2.3 FRAX 13%/2.7%  . Sleep apnea    Borderline.   Did not require sleeping.    . Thyroid disease    nodule on thyroid  . Tubular adenoma of colon     Patient Active Problem List   Diagnosis Date Noted  . Educated about COVID-19 virus infection 01/05/2020  . PAF (paroxysmal  atrial fibrillation) ([place]) 12/16/2018  . Dyslipidemia 12/16/2018  . Chronic atrial fibrillation ([place]) 08/06/2018  . Chest pain, moderate coronary artery risk 08/06/2018  . Pseudophakia of right eye 02/27/2016  . Pseudophakia of left eye 02/06/2016  . 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 Obstructive sleep apnea 11/19/2012  . Dysphonia 11/19/2012  . Thyroid nodule 09/06/2012  . Osteopenia   . Hypertension   . Glaucoma   . HYPERLIPIDEMIA 12/09/2009  . HYPERTENSION 12/09/2009  . Unspecified asthma(493.90) 11/30/2009    Past Surgical History:  Procedure Laterality Date  . APPENDECTOMY    . BREAST CYST EXCISION  AGE 41, 23, 72   bilateral  . BREAST EXCISIONAL BIOPSY Bilateral   . CATARACT EXTRACTION    . CATARACT EXTRACTION     x2  . COLONOSCOPY    . DILATATION & CURETTAGE/HYSTEROSCOPY WITH TRUECLEAR N/A 02/22/2014   Procedure: DILATATION & CURETTAGE/HYSTEROSCOPY WITH TRUCLEAR;  Surgeon: [name], MD;  Location: [facility];  Service: Gynecology;  Laterality: N/A;  . ESOPHAGEAL MANOMETRY N/A 11/29/2012   Procedure: ESOPHAGEAL MANOMETRY (EM);  Surgeon: Sable Feil, MD;  Location: [facility];  Service: Endoscopy;  Laterality: N/A;  . 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  LAPAROSCOPIC ENDOMETRIOSIS FULGURATION  1972  . OOPHORECTOMY  1969   RSO  . POLYPECTOMY    . THYROIDECTOMY, PARTIAL  AGE 32  . TONSILLECTOMY    . UPPER GASTROINTESTINAL ENDOSCOPY  2013     OB History    Gravida  2   Para  2   Term  2   Preterm      AB      Living  2     SAB      TAB      Ectopic      Multiple      Live Births              Family History  Problem Relation Age of Onset  . Colon cancer Mother 45  . Alcohol abuse Mother   . Mental illness Mother   . Lung cancer Father        smoker  . Alcohol abuse Father   . Hypertension Father   . Colon cancer Father 79  .  Breast cancer Maternal Aunt        Age 46  . Heart attack Paternal Grandmother   . Other Cousin        mother side-vocal cord cancer    Social History   Tobacco Use  . Smoking status: Former Smoker    Packs/day: 0.30    Years: 25.00    Pack years: 7.50    Types: Cigarettes    Quit date: 09/14/1984    Years since quitting: 36.0  . Smokeless tobacco: Never Used  Vaping Use  . Vaping Use: Never used  Substance Use Topics  . Alcohol use: Yes    Comment: once a week  . Drug use: No    Home Medications Prior to Admission medications   Medication Sig Start Date End Date Taking? Authorizing Provider  acetaminophen (TYLENOL) 500 MG tablet Take 500 mg by mouth as needed (Takes it about 5 times a week).     [provider]  Albuterol Sulfate (PROAIR HFA IN) Inhale into the lungs.    [provider]  amLODipine (NORVASC) 5 MG tablet Take 5 mg by mouth daily.     [provider]  Calcium Citrate (CITRACAL PO) Take 2 tablets by mouth daily. 500 mg Calcium & 1000 units of Vitamin D    [provider]  chlorpheniramine (CHLOR-TRIMETON) 4 MG tablet Take 4 mg by mouth every 6 (six) hours as needed for allergies.    [provider]  ELIQUIS 5 MG TABS tablet TAKE 1 TABLET BY MOUTH TWICE DAILY. 08/06/20   Minus Breeding, MD  esomeprazole (NEXIUM) 20 MG capsule Take 20 mg by mouth daily.     [provider]  metoprolol succinate (TOPROL-XL) 50 MG 24 hr tablet Take 50 mg by mouth daily. 03/10/11   Romeo Apple, MD  NONFORMULARY OR COMPOUNDED ITEM Estradiol vaginal cream 0.02% insert 1 gram vaginally twice weekly 09/30/19   Fontaine, Belinda Block, MD  pravastatin (PRAVACHOL) 40 MG tablet TAKE 1 TABLET BY MOUTH DAILY. 01/13/20   Minus Breeding, MD    Allergies    Nitrofurantoin, Cortisone, Lisinopril, and Symbicort [budesonide-formoterol fumarate]  Review of Systems   Review of Systems  Constitutional: Negative for diaphoresis.   Respiratory: Positive for shortness of breath. Negative for cough.   Cardiovascular: Positive for chest pain and palpitations. Negative for syncope.  Gastrointestinal: Negative for nausea.  Neurological: Positive for dizziness.  All other systems reviewed and are negative.  Physical Exam Updated Vital Signs BP (!) 106/58   Pulse 64   Temp 97.6 F (36.4 C) (Temporal)   Resp 16   SpO2 100%   Physical Exam Vitals and nursing note reviewed.  Constitutional:      Appearance: She is well-developed.  HENT:     Head: Normocephalic and atraumatic.  Cardiovascular:     Rate and Rhythm: Tachycardia present. Rhythm irregular.  Pulmonary:     Effort: No respiratory distress.     Breath sounds: No stridor. No decreased breath sounds or wheezing.  Chest:     Chest wall: No mass, deformity or tenderness.  Abdominal:     General: There is no distension.     Palpations: Abdomen is soft.  Musculoskeletal:        General: Normal range of motion.     Cervical back: Normal range of motion.  Skin:    General: Skin is warm and dry.  Neurological:     General: No focal deficit present.     Mental Status: She is alert.     ED Results / Procedures / Treatments   Labs (all labs ordered are listed, but only abnormal results are displayed) Labs Reviewed  BASIC METABOLIC PANEL - Abnormal; Notable for the following components:      Result Value   Glucose, Bld 124 (*)    BUN 28 (*)    Creatinine, Ser 1.02 (*)    GFR, Estimated 57 (*)    All other components within normal limits  CBC  MAGNESIUM  TROPONIN I (HIGH SENSITIVITY)    EKG EKG Interpretation  Date/Time:  Monday September 10 2020 01:26:52 EST Ventricular Rate:  184 PR Interval:    QRS Duration: 78 QT Interval:  262 QTC Calculation: 458 R Axis:   39 Text Interpretation: Supraventricular tachycardia ST & T wave abnormality, consider inferior ischemia Abnormal ECG faster rate than previous, otherwise no significant change  Confirmed by Merrily Pew [phone]) on 09/10/2020 1:43:40 AM   Radiology DG Chest Portable 1 View  Result Date: 09/10/2020 CLINICAL DATA:  Chest pain and tachycardia EXAM: PORTABLE CHEST 1 VIEW COMPARISON:  06/29/2018 FINDINGS: Normal heart size and pulmonary vascularity. Peribronchial thickening suggesting acute or chronic bronchitis. No focal consolidation or edema. No pleural effusions. No pneumothorax. Mediastinal contours appear intact. IMPRESSION: Peribronchial thickening suggesting acute or chronic bronchitis. No focal consolidation. Electronically Signed   By: Lucienne Capers M.D.   On: 09/10/2020 02:14    Procedures .Critical Care Performed by: Merrily Pew, MD Authorized by: Merrily Pew, MD   Critical care provider statement:    Critical care time (minutes):  45   Critical care was necessary to treat or prevent imminent or life-threatening deterioration of the following conditions:  Circulatory failure   Critical care was time spent personally by me on the following activities:  Discussions with consultants, evaluation of patient's response to treatment, examination of patient, ordering and performing treatments and interventions, ordering and review of laboratory studies, ordering and review of radiographic studies, pulse oximetry, re-evaluation of patient's condition, obtaining history from patient or surrogate and review of old charts   (including critical care time)  Medications Ordered in ED Medications  lactated ringers bolus 1,000 mL (0 mLs Intravenous Stopped 09/10/20 0256)  adenosine (ADENOCARD) 6 MG/2ML injection 6 mg (6 mg Intravenous Given 09/10/20 0333)  adenosine (ADENOCARD) 6 MG/2ML injection 12 mg (12 mg Intravenous Given 09/10/20 0349)    ED Course  I have reviewed the triage vital signs  and the nursing notes.  Pertinent labs & imaging results that were available during my care of the patient were reviewed by me and considered in my medical decision  making (see chart for details).    MDM Rules/Calculators/A&P                         Vagal maneuvers did not work.  She very taken metoprolol XL once on top of her normal dosing and had low heart rate some hesitant to give any type of long-acting beta or calcium channel blockers.  While observing in the room the patient goes from a sinus rhythm back and SVT often.  It was unclear exactly what the rhythm was did not seem to be atrial fibrillation.  Secondary to this being a bit more complex I did discuss it with cardiology who suggested using adenosine to at least get the underlying rhythm but could also possibly interrupted enough to keep her in sinus.  Tried 6 mg which was not successful so 12 mg was successful.  Patient was then returned to a normal sinus rhythm and was observed for over an hour afterwards and stayed in a sinus rhythm.  Her blood pressures were within normal limits her chest pain had resolved I think it is mostly related to demand ischemia secondary to the fast heart rate and thus no indication for second troponin.  Patient is stable for discharge with cardiology follow-up.  She may need to increase her dose but this all could just been situational as well.  Final Clinical Impression(s) / ED Diagnoses Final diagnoses:  SVT (supraventricular tachycardia) ([place])    Rx / DC Orders ED Discharge Orders    None       Jamya Starry, Corene Cornea, MD 09/11/20 [phone]

## 2020-09-26 DIAGNOSIS — Z1212 Encounter for screening for malignant neoplasm of rectum: Secondary | ICD-10-CM | POA: Diagnosis not present

## 2020-10-02 ENCOUNTER — Other Ambulatory Visit: Payer: Self-pay

## 2020-10-02 ENCOUNTER — Encounter: Payer: Self-pay | Admitting: Obstetrics and Gynecology

## 2020-10-02 ENCOUNTER — Ambulatory Visit (INDEPENDENT_AMBULATORY_CARE_PROVIDER_SITE_OTHER): Payer: Medicare Other | Admitting: Obstetrics and Gynecology

## 2020-10-02 VITALS — BP 120/74 | Ht 61.5 in | Wt 146.0 lb

## 2020-10-02 DIAGNOSIS — N952 Postmenopausal atrophic vaginitis: Secondary | ICD-10-CM

## 2020-10-02 DIAGNOSIS — M8580 Other specified disorders of bone density and structure, unspecified site: Secondary | ICD-10-CM

## 2020-10-02 DIAGNOSIS — Z01419 Encounter for gynecological examination (general) (routine) without abnormal findings: Secondary | ICD-10-CM

## 2020-10-02 NOTE — Progress Notes (Signed)
Meagan Key May 23, 1946 [id number]  SUBJECTIVE:  74 y.o. [phone] female here for a breast and pelvic exam. She has no gynecologic concerns.  No further vaginal bleeding.  Current Outpatient Medications  Medication Sig Dispense Refill  . acetaminophen (TYLENOL) 500 MG tablet Take 500 mg by mouth as needed (Takes it about 5 times a week).     . Albuterol Sulfate (PROAIR HFA IN) Inhale into the lungs.    Marland Kitchen amLODipine (NORVASC) 5 MG tablet Take 5 mg by mouth daily.     . Calcium Citrate (CITRACAL PO) Take 2 tablets by mouth daily. 500 mg Calcium & 1000 units of Vitamin D    . chlorpheniramine (CHLOR-TRIMETON) 4 MG tablet Take 4 mg by mouth every 6 (six) hours as needed for allergies.    Marland Kitchen ELIQUIS 5 MG TABS tablet TAKE 1 TABLET BY MOUTH TWICE DAILY. 60 tablet 5  . esomeprazole (NEXIUM) 20 MG capsule Take 20 mg by mouth daily.     . metoprolol succinate (TOPROL-XL) 50 MG 24 hr tablet Take 50 mg by mouth daily.    . metroNIDAZOLE (FLAGYL PO) Take by mouth as needed.    . NONFORMULARY OR COMPOUNDED ITEM Estradiol vaginal cream 0.02% insert 1 gram vaginally twice weekly 24 each 4  . pravastatin (PRAVACHOL) 40 MG tablet TAKE 1 TABLET BY MOUTH DAILY. 90 tablet 3   No current facility-administered medications for this visit.   Allergies: Nitrofurantoin, Cortisone, Lisinopril, and Symbicort [budesonide-formoterol fumarate]  No LMP recorded. Patient is postmenopausal.  Past medical history,surgical history, problem list, medications, allergies, family history and social history were all reviewed and documented as reviewed in the EPIC chart.  GYN ROS: no abnormal bleeding, pelvic pain or discharge, no breast pain or new or enlarging lumps on self exam.  No dysuria, urinary frequency, pain with urination, cloudy/malodorous urine.   OBJECTIVE:  BP 120/74   Ht 5' 1.5" (1.562 m)   Wt 146 lb (66.2 kg)   BMI 27.14 kg/m  The patient appears well, alert, oriented, in no distress.  BREAST EXAM:  breasts appear normal, no suspicious masses, no skin or nipple changes or axillary nodes  PELVIC EXAM: VULVA: normal appearing vulva with atrophic change, no masses, tenderness or lesions, VAGINA: normal appearing vagina with atrophic change, normal color and discharge, no lesions, CERVIX: normal appearing atrophic cervix without discharge or lesions, UTERUS: uterus is normal size, shape, consistency and nontender, ADNEXA: normal adnexa in size, nontender and no masses, RECTAL: normal rectal, no masses  Chaperone: [name] present during the examination  ASSESSMENT:  74 y.o. [id number] here for a breast and pelvic exam  PLAN:   1. Postmenopausal.  Work-up for occasional spotting last year with negative endometrial biopsy and sonohysterogram 09/2019.  Was historically using vaginal estradiol cream compounded for recurrent UTI.  Had not been using estradiol cream for about the past year.  She would like to about restarting again as she did have a recent UTI.  Refill is requested through [facility], she only needs to use it as directed twice weekly.  Risks of systemic absorption reviewed to include thrombotic disease such as heart attack, stroke, DVT, PE and the breast cancer issue, and she is comfortable with that and would like to continue she notices recurrence of UTIs again. 2. Pap smear 2019.  No history of abnormal Pap smears.  Pap smear not indicated today. 3. Mammogram 01/2020.  Normal breast exam today.  She is reminded to schedule an annual mammogram  this next year when due. 4. Colonoscopy 2021.  She will follow up at the interval recommended by her GI specialist.   5.  Osteoporosis.  DEXA 2018.  She is followed by her primary physician. 6. Health maintenance.  No labs today as she normally has these completed with her primary care doctor.  Return annually or sooner, prn.  Joseph Pierini MD 10/02/20

## 2020-10-03 ENCOUNTER — Telehealth: Payer: Self-pay | Admitting: *Deleted

## 2020-10-03 ENCOUNTER — Ambulatory Visit (INDEPENDENT_AMBULATORY_CARE_PROVIDER_SITE_OTHER): Payer: Medicare Other | Admitting: Neurology

## 2020-10-03 ENCOUNTER — Encounter: Payer: Self-pay | Admitting: Neurology

## 2020-10-03 VITALS — BP 159/85 | HR 69 | Ht 61.5 in | Wt 148.0 lb

## 2020-10-03 DIAGNOSIS — R002 Palpitations: Secondary | ICD-10-CM | POA: Diagnosis not present

## 2020-10-03 DIAGNOSIS — G4719 Other hypersomnia: Secondary | ICD-10-CM

## 2020-10-03 DIAGNOSIS — I471 Supraventricular tachycardia: Secondary | ICD-10-CM | POA: Diagnosis not present

## 2020-10-03 DIAGNOSIS — G4733 Obstructive sleep apnea (adult) (pediatric): Secondary | ICD-10-CM | POA: Diagnosis not present

## 2020-10-03 DIAGNOSIS — I480 Paroxysmal atrial fibrillation: Secondary | ICD-10-CM | POA: Diagnosis not present

## 2020-10-03 DIAGNOSIS — R351 Nocturia: Secondary | ICD-10-CM | POA: Diagnosis not present

## 2020-10-03 MED ORDER — NONFORMULARY OR COMPOUNDED ITEM: 4 refills | Status: DC

## 2020-10-03 NOTE — Progress Notes (Signed)
Subjective:    Patient ID: Meagan Key is a 74 y.o. female.  HPI     Star Age, MD, PhD [facility] [address] [place], [place] [address] Dear Dr. Virgina Jock,   I saw your patient, Meagan Key, upon your kind request in the sleep clinic today for initial consultation of her sleep disorder, in particular, concern for underlying obstructive sleep apnea.  The patient is unaccompanied today.  As you know, Meagan Key is a 74 year old right-handed woman with an underlying medical history of MGUS, hypertension, hyperlipidemia, asthma, vitamin D deficiency, paroxysmal A. fib, glaucoma, reflux disease, osteopenia, SVT, and mildly overweight state, who reports snoring and some excessive daytime somnolence.  I reviewed your office note from 08/16/2020.  She had blood work through your office at the time including CMP, CBC, lipid panel, TSH, vitamin D, free T4.  Lipid panel showed a total cholesterol of 207, HDL 74, triglycerides 84, LDL 116, TSH was normal, vitamin D 46.8. She has woken up with palpitations at night.  She had a home sleep test on 11/22/2012 which showed mild obstructive sleep apnea.  She has not been on CPAP therapy.  I was able to review her home sleep test report, total AHI was 10.8/h, desaturation nadir 82%.  Her Epworth sleepiness score is 11 out of 24, fatigue severity score is 37 out of 63.  Of note, she presented to the emergency room on 09/10/2020 with chest pain and palpitations. She was found to be in SVT, vagal maneuvers were not successful, she was treated with adenosine which was successful.   She was previously diagnosed with paroxysmal A. fib and placed on Eliquis in 2019. She has been following with Dr. Percival Spanish and cardiology. She has an appointment tomorrow.  When she went to the ER, she had entertained several guests at home and also had some alcohol, which is more than what she usually has. She had 3 alcoholic beverages,  tries to limit herself to 1 glass of white wine per week on average. She has limited her caffeine and has gone to have calf coffee. She works part-time, she is a Chief Executive Officer. She lives with her husband and they have 2 grown children, no pets in the house, TV in the bedroom but do not utilize it typically. She is in bed by 10 and rise time is around 6 or 630 typically. She denies recurrent morning headaches but has significant nocturia, at least 2 or 3 times per average night. Her husband has a CPAP machine. She would consider it.  Her Past Medical History Is Significant For: Past Medical History:  Diagnosis Date  . Allergy   . Arthritis    hands  . Asthma    rarely uses inhaler  . Atrial fibrillation ([place])   . Cataracts, bilateral   . Chicken pox   . Endometriosis   . Esophageal dysfunction   . GERD (gastroesophageal reflux disease)   . Glaucoma    surg for preventive for NARROW ANGLE  . Hematuria   . Hx of colonic polyp 06-12-2003   Colonoscopy Dr Amedeo Plenty  . Hyperlipemia   . Hypertension   . Lichen planus   . Osteopenia 05/2013   T score -2.3 FRAX 13%/2.7%  . Sleep apnea    Borderline.   Did not require sleeping.    Marland Kitchen SVT (supraventricular tachycardia) ([place])   . Thyroid disease    nodule on thyroid  . Tubular adenoma of colon  Her Past Surgical History Is Significant For: Past Surgical History:  Procedure Laterality Date  . APPENDECTOMY    . BREAST CYST EXCISION  AGE 88, 23, 64   bilateral  . BREAST EXCISIONAL BIOPSY Bilateral   . CATARACT EXTRACTION    . CATARACT EXTRACTION     x2  . COLONOSCOPY    . DILATATION & CURETTAGE/HYSTEROSCOPY WITH TRUECLEAR N/A 02/22/2014   Procedure: DILATATION & CURETTAGE/HYSTEROSCOPY WITH TRUCLEAR;  Surgeon: [name], MD;  Location: [facility];  Service: Gynecology;  Laterality: N/A;  . ESOPHAGEAL MANOMETRY N/A 11/29/2012   Procedure: ESOPHAGEAL MANOMETRY (EM);  Surgeon: Sable Feil, MD;  Location: [facility];  Service: Endoscopy;   Laterality: N/A;  . 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 LAPAROSCOPIC ENDOMETRIOSIS FULGURATION  1972  . OOPHORECTOMY  1969   RSO  . POLYPECTOMY    . THYROIDECTOMY, PARTIAL  AGE 6  . TONSILLECTOMY    . UPPER GASTROINTESTINAL ENDOSCOPY  2013    Her Family History Is Significant For: Family History  Problem Relation Age of Onset  . Colon cancer Mother 9  . Alcohol abuse Mother   . Mental illness Mother   . Lung cancer Father        smoker  . Alcohol abuse Father   . Hypertension Father   . Colon cancer Father 46  . Breast cancer Maternal Aunt        Age 27  . Heart attack Paternal Grandmother   . Other Cousin        mother side-vocal cord cancer  . Breast cancer Cousin 78    Her Social History Is Significant For: Social History   Socioeconomic History  . Marital status: Married    Spouse name: Not on file  . Number of children: 2  . Years of education: Not on file  . Highest education level: Not on file  Occupational History  . Occupation: attorney    Employer: [employer]  Tobacco Use  . Smoking status: Former Smoker    Packs/day: 0.30    Years: 25.00    Pack years: 7.50    Types: Cigarettes    Quit date: 09/14/1984    Years since quitting: 36.0  . Smokeless tobacco: Never Used  Vaping Use  . Vaping Use: Never used  Substance and Sexual Activity  . Alcohol use: Yes    Comment: once a week  . Drug use: No  . Sexual activity: Never    Birth control/protection: Post-menopausal    Comment: 1st intercourse 74 yo-Fewer than 5 partners  Other Topics Concern  . Not on file  Social History Narrative   MARRIED; 2 CHILDREN   LAW DEGREE; ATTORNEY   Social Determinants of Health   Financial Resource Strain: Not on file  Food Insecurity: Not on file  Transportation Needs: Not on file  Physical Activity: Not on file  Stress: Not on file  Social Connections: Not on file    Her Allergies Are:  Allergies  Allergen  Reactions  . Nitrofurantoin Shortness Of Breath and Other (See Comments)    Itching in throat   . Cortisone     A-fib  . Lisinopril Itching and Other (See Comments)    SOB and itching is in throat  . Symbicort [Budesonide-Formoterol Fumarate]     A-fib  :   Her Current Medications Are:  Outpatient Encounter Medications as of 10/03/2020  Medication Sig  . acetaminophen (  TYLENOL) 500 MG tablet Take 500 mg by mouth as needed (Takes it about 5 times a week).   . Albuterol Sulfate (PROAIR HFA IN) Inhale into the lungs.  Marland Kitchen amLODipine (NORVASC) 5 MG tablet Take 5 mg by mouth daily.   . Calcium Citrate (CITRACAL PO) Take 2 tablets by mouth daily. 500 mg Calcium & 1000 units of Vitamin D  . chlorpheniramine (CHLOR-TRIMETON) 4 MG tablet Take 4 mg by mouth every 6 (six) hours as needed for allergies.  Marland Kitchen ELIQUIS 5 MG TABS tablet TAKE 1 TABLET BY MOUTH TWICE DAILY.  Marland Kitchen esomeprazole (NEXIUM) 20 MG capsule Take 20 mg by mouth daily.   . metoprolol succinate (TOPROL-XL) 50 MG 24 hr tablet Take 50 mg by mouth daily.  . metroNIDAZOLE (FLAGYL PO) Take by mouth as needed.  . pravastatin (PRAVACHOL) 40 MG tablet TAKE 1 TABLET BY MOUTH DAILY.  . [DISCONTINUED] NONFORMULARY OR COMPOUNDED ITEM Estradiol vaginal cream 0.02% insert 1 gram vaginally twice weekly   No facility-administered encounter medications on file as of 10/03/2020.  :   Review of Systems:  Out of a complete 14 point review of systems, all are reviewed and negative with the exception of these symptoms as listed below:  Review of Systems  Neurological:       Pt presents today to discuss her sleep. Pt had an HST in 2014 that reportedly showed borderline osa. She elected to not start PAP therapy. Since then she has been diagnosed with afib and has also noticed her heart racing during the night. Pt does endorse snoring.  Epworth Sleepiness Scale 0= would never doze 1= slight chance of dozing 2= moderate chance of dozing 3= high chance  of dozing  Sitting and reading: 2 Watching TV: 2 Sitting inactive in a public place (ex. Theater or meeting): 1 As a passenger in a car for an hour without a break: 1 Lying down to rest in the afternoon: 3 Sitting and talking to someone: 0 Sitting quietly after lunch (no alcohol): 2 In a car, while stopped in traffic: 0 Total: 11     Objective:  Neurological Exam  Physical Exam Physical Examination:   Vitals:   10/03/20 0912  BP: (!) 159/85  Pulse: 69    General Examination: The patient is a very pleasant 74 y.o. female in no acute distress. She appears well-developed and well-nourished and well groomed.   HEENT: Normocephalic, atraumatic, pupils are equal, round and reactive to light, extraocular tracking is good without limitation to gaze excursion or nystagmus noted. Hearing is grossly intact. Face is symmetric with normal facial animation. Speech is clear with no dysarthria noted. There is no hypophonia. There is no lip, neck/head, jaw or voice tremor. Neck is supple with full range of passive and active motion. There are no carotid bruits on auscultation. Oropharynx exam reveals: mild mouth dryness, good dental hygiene and mild airway crowding, due to small airway entry and redundant soft palate, tonsils absent.  Mallampati class II, neck circumference of 14 mm inches.  She has a mild to moderate overbite.  Tongue protrudes centrally and palate elevates symmetrically.  Chest: Clear to auscultation without wheezing, rhonchi or crackles noted.  Heart: S1+S2+0, regular and normal without murmurs, rubs or gallops noted.   Abdomen: Soft, non-tender and non-distended with normal bowel sounds appreciated on auscultation.  Extremities: There is no pitting edema in the distal lower extremities bilaterally.   Skin: Warm and dry without trophic changes noted.   Musculoskeletal: exam reveals no  obvious joint deformities, tenderness or joint swelling or erythema.   Neurologically:   Mental status: The patient is awake, alert and oriented in all 4 spheres. Her immediate and remote memory, attention, language skills and fund of knowledge are appropriate. There is no evidence of aphasia, agnosia, apraxia or anomia. Speech is clear with normal prosody and enunciation. Thought process is linear. Mood is normal and affect is normal.  Cranial nerves II - XII are as described above under HEENT exam.  Motor exam: Normal bulk, strength and tone is noted. There is no tremor, fine motor skills and coordination: grossly intact.  Cerebellar testing: No dysmetria or intention tremor. There is no truncal or gait ataxia.  Sensory exam: intact to light touch in the upper and lower extremities.  Gait, station and balance: She stands easily. No veering to one side is noted. No leaning to one side is noted. Posture is age-appropriate and stance is narrow based. Gait shows normal stride length and normal pace. No problems turning are noted.   Assessment and Plan:  In summary, Meagan Key is a very pleasant 75 y.o.-year old female with an underlying medical history of MGUS, hypertension, hyperlipidemia, asthma, vitamin D deficiency, paroxysmal A. fib, glaucoma, reflux disease, osteopenia, SVT, and mildly overweight state, who was previously diagnosed several years ago with mild obstructive sleep apnea.  She presents for evaluation of her sleep apnea, she has since her original sleep testing been diagnosed with paroxysmal A. fib and had a recent emergency room visit for SVT.  I had a long chat with the patient about my findings and the diagnosis of OSA, its prognosis and treatment options. We talked about medical treatments, surgical interventions and non-pharmacological approaches. I explained in particular the risks and ramifications of untreated moderate to severe OSA, especially with respect to developing cardiovascular disease down the Road, including congestive heart failure, difficult to treat  hypertension, cardiac arrhythmias, or stroke. Even type 2 diabetes has, in part, been linked to untreated OSA. Symptoms of untreated OSA include daytime sleepiness, memory problems, mood irritability and mood disorder such as depression and anxiety, lack of energy, as well as recurrent headaches, especially morning headaches. We talked about trying to maintain a healthy lifestyle in general, as well as the importance of weight control. We also talked about the importance of good sleep hygiene. I recommended the following at this time: sleep study.  I explained the sleep test procedure to the patient and also outlined possible surgical and non-surgical treatment options of OSA, including the use of a custom-made dental device (which would require a referral to a specialist dentist or oral surgeon), upper airway surgical options, such as traditional UPPP or a novel less invasive surgical option in the form of Inspire hypoglossal nerve stimulation (which would involve a referral to an ENT surgeon). I also explained the CPAP treatment option to the patient, who indicated that she would be willing to try CPAP if the need arises. I explained the importance of being compliant with PAP treatment, not only for insurance purposes but primarily to improve Her symptoms, and for the patient's long term health benefit, including to reduce Her cardiovascular risks. I answered all her questions today and the patient was in agreement. I plan to see her back after the sleep study is completed and encouraged her to call with any interim questions, concerns, problems or updates.   Thank you very much for allowing me to participate in the care of this nice patient. If I  can be of any further assistance to you please do not hesitate to call me at [phone].  Sincerely,   Star Age, MD, PhD

## 2020-10-03 NOTE — Progress Notes (Signed)
Cardiology Office Note   Date:  10/04/2020   ID:  Meagan Key, DOB [birth year], MRN [id number]  PCP:  Meagan Baton, MD  Cardiologist:   Meagan Breeding, MD   No chief complaint on file.     History of Present Illness: Meagan Key is a 74 y.o. female who presents for followup of atrial fib.  She had atrial fib with RVR in Sept and was in the ED.  She converted spontaneously.  She was followed in the atrial fib clinic.    She was started on Eliquis.  She did have some bleeding gums.  Of note she did have chest pain during her RVR.  She had a low risk POET (Plain Old Exercise Treadmill).    Since I last saw her she was in the ED in late Nov with SVT.  She finally broke with adenosine 12 mg.  I reviewed these records for this visit.    She said that this was different from her atrial fibrillation being it was faster.  She was having dinner.  She might of had a little more wine as she has been really cutting back on this.  She developed a rapid heart rate.  She took metoprolol and said it went slower but then when she went to bed she woke up in the night with increased rapid heart rate.  With this she had chest discomfort.  She went to the emergency room where it was narrow complex and regular. With a rate of 184.  I do note that though she had chest pain troponins were negative.  She has not had any further episodes.  She had otherwise been feeling okay.   She otherwise has been active.  She denies any chest pressure, neck or arm discomfort.  She has no shortness of breath, PND or orthopnea.  Past Medical History:  Diagnosis Date  . Allergy   . Arthritis    hands  . Asthma    rarely uses inhaler  . Atrial fibrillation ([place])   . Cataracts, bilateral   . Chicken pox   . Endometriosis   . Esophageal dysfunction   . GERD (gastroesophageal reflux disease)   . Glaucoma    surg for preventive for NARROW ANGLE  . Hematuria   . Hx of colonic polyp 06-12-2003   Colonoscopy Dr Amedeo Plenty  .  Hyperlipemia   . Hypertension   . Lichen planus   . Osteopenia 05/2013   T score -2.3 FRAX 13%/2.7%  . Sleep apnea    Borderline.   Did not require sleeping.    Marland Kitchen SVT (supraventricular tachycardia) ([place])   . Thyroid disease    nodule on thyroid  . Tubular adenoma of colon     Past Surgical History:  Procedure Laterality Date  . APPENDECTOMY    . BREAST CYST EXCISION  AGE 24, 23, 32   bilateral  . BREAST EXCISIONAL BIOPSY Bilateral   . CATARACT EXTRACTION    . CATARACT EXTRACTION     x2  . COLONOSCOPY    . DILATATION & CURETTAGE/HYSTEROSCOPY WITH TRUECLEAR N/A 02/22/2014   Procedure: DILATATION & CURETTAGE/HYSTEROSCOPY WITH TRUCLEAR;  Surgeon: [name], MD;  Location: [facility];  Service: Gynecology;  Laterality: N/A;  . ESOPHAGEAL MANOMETRY N/A 11/29/2012   Procedure: ESOPHAGEAL MANOMETRY (EM);  Surgeon: Sable Feil, MD;  Location: [facility];  Service: Endoscopy;  Laterality: N/A;  . 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  LAPAROSCOPIC ENDOMETRIOSIS FULGURATION  1972  . OOPHORECTOMY  1969   RSO  . POLYPECTOMY    . THYROIDECTOMY, PARTIAL  AGE 29  . TONSILLECTOMY    . UPPER GASTROINTESTINAL ENDOSCOPY  2013     Current Outpatient Medications  Medication Sig Dispense Refill  . acetaminophen (TYLENOL) 500 MG tablet Take 500 mg by mouth as needed (Takes it about 5 times a week).     . Albuterol Sulfate (PROAIR HFA IN) Inhale into the lungs.    . Calcium Citrate (CITRACAL PO) Take 2 tablets by mouth daily. 500 mg Calcium & 1000 units of Vitamin D    . chlorpheniramine (CHLOR-TRIMETON) 4 MG tablet Take 4 mg by mouth every 6 (six) hours as needed for allergies.    Marland Kitchen ELIQUIS 5 MG TABS tablet TAKE 1 TABLET BY MOUTH TWICE DAILY. 60 tablet 5  . esomeprazole (NEXIUM) 20 MG capsule Take 20 mg by mouth daily.     . metroNIDAZOLE (FLAGYL) 500 MG tablet Take by mouth.    . NONFORMULARY OR COMPOUNDED ITEM Estradiol vaginal cream 0.02% insert 1  gram vaginally twice weekly 24 each 4  . pravastatin (PRAVACHOL) 40 MG tablet TAKE 1 TABLET BY MOUTH DAILY. 90 tablet 3  . metoprolol succinate (TOPROL-XL) 100 MG 24 hr tablet Take 1 tablet (100 mg total) by mouth daily. 90 tablet 3   No current facility-administered medications for this visit.    Allergies:   Nitrofurantoin, Cortisone, Lisinopril, and Symbicort [budesonide-formoterol fumarate]    ROS:  Please see the history of present illness.   Otherwise, review of systems are positive for none   All other systems are reviewed and negative.    PHYSICAL EXAM: VS:  BP 130/77   Pulse 68   Ht 5\' 5"  (1.651 m)   Wt 142 lb (64.4 kg)   SpO2 98%   BMI 23.63 kg/m  , BMI Body mass index is 23.63 kg/m.  GENERAL:  Well appearing NECK:  No jugular venous distention, waveform within normal limits, carotid upstroke brisk and symmetric, no bruits, no thyromegaly LUNGS:  Clear to auscultation bilaterally CHEST:  Unremarkable HEART:  PMI not displaced or sustained,S1 and S2 within normal limits, no S3, no S4, no clicks, no rubs, no murmurs ABD:  Flat, positive bowel sounds normal in frequency in pitch, no bruits, no rebound, no guarding, no midline pulsatile mass, no hepatomegaly, no splenomegaly EXT:  2 plus pulses throughout, no edema, no cyanosis no clubbing  EKG:  EKG is  ordered today. Sinus rhythm, rate 68, axis within normal limits, intervals within normal limits, no acute ST-T wave changes.  Recent Labs: 09/10/2020: BUN 28; Creatinine, Ser 1.02; Hemoglobin 13.2; Magnesium 1.8; Platelets 237; Potassium 3.9; Sodium 141    Lipid Panel    Component Value Date/Time   CHOL 168 01/28/2011 0855   TRIG 41.0 01/28/2011 0855   HDL 91.90 01/28/2011 0855   CHOLHDL 2 01/28/2011 0855   VLDL 8.2 01/28/2011 0855   LDLCALC 68 01/28/2011 0855      Wt Readings from Last 3 Encounters:  10/04/20 142 lb (64.4 kg)  10/03/20 148 lb (67.1 kg)  10/02/20 146 lb (66.2 kg)      Other studies  Reviewed: Additional studies/ records that were reviewed today include: ED records Review of the above records demonstrates: See elsewhere   ASSESSMENT AND PLAN:  ATRIAL FIB:  Ms. Meagan Key has a CHA2DS2 - VASc score of 2.    She tolerates anticoagulation.  No  change in therapy.   SVT: This did not appear to be atrial fibrillation.  I do not see the rhythm strips when she broke to see if there were any flutter waves.  This appeared to be an SVT of another mechanism.  Today I am going to increase her Toprol to 100 mg daily.  I will get rid of the Norvasc.  We have agreed that we will try this therapy first.  She might need an antiarrhythmic if she has proven breakthroughs of fibrillation.  She might need ablation if she has an SVT of another mechanism.  She understands vagal maneuvers.  I did review and her thyroid was checked.  HTN: Blood pressure is at target.  No change in therapy.   DYSLIPIDEMIA: Her LDL was 108, HDL 84.   COVID EDUCATION: She has been vaccinated. .   Current medicines are reviewed at length with the patient today.  The patient does not have concerns regarding medicines.  The following changes have been made:  As above  Labs/ tests ordered today include:  None  No orders of the defined types were placed in this encounter.    Disposition:   FU with me in 2 months.     Signed, Meagan Breeding, MD  10/04/2020 10:44 AM    [facility]

## 2020-10-03 NOTE — Telephone Encounter (Signed)
-----   Message from Joseph Pierini, MD sent at 10/02/2020  9:42 AM EST ----- Regarding: compounded vaginal estrogen cream Please refill [facility] estradiol [facility]

## 2020-10-03 NOTE — Telephone Encounter (Signed)
Rx called in 

## 2020-10-03 NOTE — Patient Instructions (Addendum)
Thank you for choosing [facility] for your sleep related care! It was nice to meet you today! I appreciate that you entrust me with your sleep related healthcare concerns. I hope, I was able to address at least some of your concerns today, and that I can help you feel reassured and also get better.    Here is what we discussed today and what we came up with as our plan for you:    Based on your symptoms and your exam I believe you are still at risk for obstructive sleep apnea (aka OSA), and I think we should proceed with a sleep study to determine whether you do or do not have OSA and how severe it is. Even, if you have mild OSA, I may want you to consider treatment with CPAP, as treatment of even borderline or mild sleep apnea can result and improvement of symptoms such as sleep disruption, daytime sleepiness, nighttime bathroom breaks, restless leg symptoms, improvement of headache syndromes, even improved mood disorder. Given your history of SVT and prior history of paroxysmal atrial fibrillation, it would make sense to try either AutoPap or CPAP therapy for even mild sleep apnea.  As explained, an attended sleep study meaning you get to stay overnight in the sleep lab, lets [place] monitor sleep-related behaviors such as sleep talking and leg movements in sleep, in addition to monitoring for sleep apnea.  A home sleep test is a screening tool for sleep apnea only, and unfortunately does not help with any other sleep-related diagnoses.  Please remember, the long-term risks and ramifications of untreated moderate to severe obstructive sleep apnea are: increased Cardiovascular disease, including congestive heart failure, stroke, difficult to control hypertension, treatment resistant obesity, arrhythmias, especially irregular heartbeat commonly known as A. Fib. (atrial fibrillation); even type 2 diabetes has been linked to untreated OSA.   Sleep apnea can cause disruption of sleep and sleep  deprivation in most cases, which, in turn, can cause recurrent headaches, problems with memory, mood, concentration, focus, and vigilance. Most people with untreated sleep apnea report excessive daytime sleepiness, which can affect their ability to drive. Please do not drive if you feel sleepy. Patients with sleep apnea can also develop difficulty initiating and maintaining sleep (aka insomnia).   Having sleep apnea may increase your risk for other sleep disorders, including involuntary behaviors sleep such as sleep terrors, sleep talking, sleepwalking.    Having sleep apnea can also increase your risk for restless leg syndrome and leg movements at night.   Please note that untreated obstructive sleep apnea may carry additional perioperative morbidity. Patients with significant obstructive sleep apnea (typically, in the moderate to severe degree) should receive, if possible, perioperative PAP (positive airway pressure) therapy and the surgeons and particularly the anesthesiologists should be informed of the diagnosis and the severity of the sleep disordered breathing.   I will likely see you back after your sleep study to go over the test results and where to go from there. We will call you after your sleep study to advise about the results (most likely, you will hear from [facility], my nurse) and to set up an appointment at the time, as necessary.    Our sleep lab administrative assistant will call you to schedule your sleep study and give you further instructions, regarding the check in process for the sleep study, arrival time, what to bring, when you can expect to leave after the study, etc., and to answer any other logistical questions you may  have. If you don't hear back from her by about 2 weeks from now, please feel free to call her direct line at [phone] or you can call our general clinic number, or email [place] through My Chart.

## 2020-10-04 ENCOUNTER — Other Ambulatory Visit: Payer: Self-pay

## 2020-10-04 ENCOUNTER — Encounter: Payer: Self-pay | Admitting: Cardiology

## 2020-10-04 ENCOUNTER — Ambulatory Visit (INDEPENDENT_AMBULATORY_CARE_PROVIDER_SITE_OTHER): Payer: Medicare Other | Admitting: Cardiology

## 2020-10-04 VITALS — BP 130/77 | HR 68 | Ht 65.0 in | Wt 142.0 lb

## 2020-10-04 DIAGNOSIS — I471 Supraventricular tachycardia: Secondary | ICD-10-CM

## 2020-10-04 MED ORDER — METOPROLOL SUCCINATE ER 100 MG PO TB24: 100.0000 mg | ORAL_TABLET | Freq: Every day | ORAL | 3 refills | Status: DC

## 2020-10-04 NOTE — Patient Instructions (Signed)
Medication Instructions:  Stop Norvasc Increase Metoprolol to 100mg  daily *If you need a refill on your cardiac medications before your next appointment, please call your pharmacy*  Lab Work: None ordered this visit  Testing/Procedures: None ordered this visit  Follow-Up: At [facility], you and your health needs are our priority.  As part of our continuing mission to provide you with exceptional heart care, we have created designated Provider Care Teams.  These Care Teams include your primary Cardiologist (physician) and Advanced Practice Providers (APPs -  Physician Assistants and Nurse Practitioners) who all work together to provide you with the care you need, when you need it.   Your next appointment:   Friday December 07, 2020 at 09:00am  The format for your next appointment:   In Person  Provider:   Minus Breeding, MD

## 2020-10-08 ENCOUNTER — Telehealth: Payer: Self-pay

## 2020-10-08 DIAGNOSIS — H268 Other specified cataract: Secondary | ICD-10-CM | POA: Diagnosis not present

## 2020-10-08 DIAGNOSIS — H40023 Open angle with borderline findings, high risk, bilateral: Secondary | ICD-10-CM | POA: Diagnosis not present

## 2020-10-08 NOTE — Addendum Note (Signed)
Addended by: Lubertha Sayres on: 10/08/2020 09:50 AM   Modules accepted: Orders

## 2020-10-08 NOTE — Telephone Encounter (Signed)
LVM for pt to call me back to schedule sleep study  

## 2020-10-22 ENCOUNTER — Ambulatory Visit: Payer: Medicare Other | Admitting: Cardiology

## 2020-10-23 ENCOUNTER — Ambulatory Visit: Payer: Medicare Other | Admitting: Cardiology

## 2020-10-29 DIAGNOSIS — R300 Dysuria: Secondary | ICD-10-CM | POA: Diagnosis not present

## 2020-10-30 ENCOUNTER — Institutional Professional Consult (permissible substitution): Payer: Medicare Other | Admitting: Neurology

## 2020-10-31 ENCOUNTER — Ambulatory Visit (INDEPENDENT_AMBULATORY_CARE_PROVIDER_SITE_OTHER): Payer: Medicare Other | Admitting: Neurology

## 2020-10-31 DIAGNOSIS — G4733 Obstructive sleep apnea (adult) (pediatric): Secondary | ICD-10-CM | POA: Diagnosis not present

## 2020-10-31 DIAGNOSIS — R351 Nocturia: Secondary | ICD-10-CM

## 2020-10-31 DIAGNOSIS — I480 Paroxysmal atrial fibrillation: Secondary | ICD-10-CM

## 2020-10-31 DIAGNOSIS — G4719 Other hypersomnia: Secondary | ICD-10-CM

## 2020-10-31 DIAGNOSIS — I471 Supraventricular tachycardia: Secondary | ICD-10-CM

## 2020-11-06 ENCOUNTER — Telehealth: Payer: Self-pay

## 2020-11-06 NOTE — Telephone Encounter (Signed)
-----   Message from Star Age, MD sent at 11/06/2020 11:54 AM EST ----- Patient referred by Dr. Virgina Jock, seen by me on 10/03/20, patient had a HST on 10/31/20.    Please call and notify the patient that the recent home sleep test showed obstructive sleep apnea in the severe range. I recommend treatment for this in the form of autoPAP, which means, that we don't have to bring her in for a sleep study with CPAP, but will let her start using a so called autoPAP machine at home, through a DME company (of her choice, or as per insurance requirement). The DME representative will fit the patient with a mask of choice, educate her on how to use the machine, how to put the mask on, etc. I have placed an order in the chart. Please send the order to a local DME, talk to patient, send report to referring MD. Please also reinforce the need for compliance with treatment. We will need a FU in sleep clinic for 10 weeks post-PAP set up, please arrange that with me or one of our NPs. Thanks,   Star Age, MD, PhD [facility])

## 2020-11-06 NOTE — Procedures (Signed)
   [facility]  HOME SLEEP TEST (Watch PAT)  STUDY DATE: 10/31/20  DOB: [birth year]  MRN: [id number]  ORDERING CLINICIAN: Star Age, MD, PhD   REFERRING CLINICIAN: [name], MD   CLINICAL INFORMATION/HISTORY: 75 year old woman with a history of MGUS, hypertension, hyperlipidemia, asthma, vitamin D deficiency, paroxysmal A. fib, glaucoma, reflux disease, osteopenia, SVT, and mildly overweight state, who reports snoring and some excessive daytime somnolence. She has woken up with palpitations at night.  Epworth sleepiness score: 11/24.  BMI: 27.2 kg/m  Neck Circumference:  14 "  FINDINGS:   Total Record Time (hours, min): 8 H 4 min  Total Sleep Time (hours, min):  6 H 52 min   Percent REM (%):    18.66 %   Calculated pAHI (per hour):  39.4      REM pAHI: 49.8     NREM pAHI: 37.3   Oxygen Saturation (%) Mean: 93  Minimum oxygen saturation (%):         79   O2 Saturation Range (%): 79-98  O2Saturation (minutes) <=88%: 1.4 min   Pulse Mean (bpm):    60  Pulse Range (48-87)   IMPRESSION: OSA (obstructive sleep apnea)  RECOMMENDATION:  This home sleep test demonstrates severe obstructive sleep apnea with a total AHI of 39.4/hour and O2 nadir of 79%. The patient will be advised to proceed with an autoPAP titration/trial at home for now. A full night titration study can be consider for treatment optimization, if needed, down the road. Please note that untreated obstructive sleep apnea may carry additional perioperative morbidity. Patients with significant obstructive sleep apnea should receive perioperative PAP therapy and the surgeons and particularly the anesthesiologist should be informed of the diagnosis and the severity of the sleep disordered breathing. The patient should be cautioned not to drive, work at heights, or operate dangerous or heavy equipment when tired or sleepy. Review and reiteration of good sleep hygiene measures should be pursued with any  patient. Other causes of the patient's symptoms, including circadian rhythm disturbances, an underlying mood disorder, medication effect and/or an underlying medical problem cannot be ruled out based on this test. Clinical correlation is recommended. The patient and his referring provider will be notified of the test results. The patient will be seen in follow up in sleep clinic at [facility].  I certify that I have reviewed the raw data recording prior to the issuance of this report in accordance with the standards of the American Academy of Sleep Medicine (AASM).  INTERPRETING PHYSICIAN:  Star Age, MD, PhD  Board Certified in Neurology and Sleep Medicine [facility] [address] [place], [place] [address] [phone]  \

## 2020-11-06 NOTE — Addendum Note (Signed)
Addended by: Star Age on: 11/06/2020 11:54 AM   Modules accepted: Orders

## 2020-11-06 NOTE — Telephone Encounter (Signed)
I called [facility]. I advised [facility] that Dr. Rexene Alberts reviewed their sleep study results and found that [facility] has severe osa. Dr. Rexene Alberts recommends that [facility] start an auto pap at home. I reviewed PAP compliance expectations with the [facility]. [facility] is agreeable to starting an auto-PAP. I advised [facility] that an order will be sent to a DME, [facility], and [facility] will call the [facility] within about one week after they file with the [facility]'s insurance. [facility] will show the [facility] how to use the machine, fit for masks, and troubleshoot the auto-PAP if needed. A follow up appt was made for insurance purposes with Dr. Rexene Alberts on 02/25/2021. [facility] verbalized understanding to arrive 15 minutes early and bring their auto-PAP. A letter with all of this information in it will be mailed to the [facility] as a reminder. I verified with the [facility] that the address we have on file is correct. [facility] verbalized understanding of results. [facility] had no questions at this time but was encouraged to call back if questions arise. I have sent the order to [facility] and have received confirmation that they have received the order.

## 2020-11-06 NOTE — Progress Notes (Signed)
Patient referred by Dr. Virgina Jock, seen by me on 10/03/20, patient had a HST on 10/31/20.    Please call and notify the patient that the recent home sleep test showed obstructive sleep apnea in the severe range. I recommend treatment for this in the form of autoPAP, which means, that we don't have to bring her in for a sleep study with CPAP, but will let her start using a so called autoPAP machine at home, through a DME company (of her choice, or as per insurance requirement). The DME representative will fit the patient with a mask of choice, educate her on how to use the machine, how to put the mask on, etc. I have placed an order in the chart. Please send the order to a local DME, talk to patient, send report to referring MD. Please also reinforce the need for compliance with treatment. We will need a FU in sleep clinic for 10 weeks post-PAP set up, please arrange that with me or one of our NPs. Thanks,   Star Age, MD, PhD [facility])

## 2020-11-07 DIAGNOSIS — N3941 Urge incontinence: Secondary | ICD-10-CM | POA: Diagnosis not present

## 2020-11-07 DIAGNOSIS — R3121 Asymptomatic microscopic hematuria: Secondary | ICD-10-CM | POA: Diagnosis not present

## 2020-11-13 ENCOUNTER — Telehealth: Payer: Self-pay | Admitting: Cardiology

## 2020-11-13 NOTE — Telephone Encounter (Signed)
Can I get the heart rates to go along with these?  Thanks.

## 2020-11-13 NOTE — Telephone Encounter (Signed)
Spoke to pt who report on Wednesday she had a urologist appointment and noticed BP was elevated. Since then, she's been checking her BP daily and it continues to remain high ( BP listed below).   1/19-160/88 1/24-173/100 1/25-170/98, 159/85 (3 hours after morning meds)   Pt c/o occasional headache but no other symptoms. Pt also report in Dec, MD discontinued amlodipine and increased metoprolol to 100 mg daily.

## 2020-11-13 NOTE — Telephone Encounter (Signed)
Spoke with patient.  Heart rates for the blood pressure readings were: 1/19 - 64 1/24 - 67 1/25 - 69, 66

## 2020-11-13 NOTE — Telephone Encounter (Signed)
Pt c/o BP issue: STAT if pt c/o blurred vision, one-sided weakness or slurred speech  1. What are your last 5 BP readings? This mooring 170/98 took medication this mooring after BP 159/88  2. Are you having any other symptoms (ex. Dizziness, headache, blurred vision, passed out)? No small headache  3. What is your BP issue? Medication was change to 100 mg metoprolol took patient off Norvasc. Please advise

## 2020-11-14 MED ORDER — AMLODIPINE BESYLATE 2.5 MG PO TABS: 2.5000 mg | ORAL_TABLET | Freq: Every day | ORAL | 3 refills | Status: DC

## 2020-11-14 NOTE — Telephone Encounter (Signed)
Spoke with patient and relayed Dr. Rosezella [place] recommendation. Patient is going to send in a blood pressure log in two weeks with readings on new medication. Prescription for Norvasc 2.5mg  daily sent to preferred pharmacy.

## 2020-11-14 NOTE — Telephone Encounter (Signed)
I would like to add Norvasc 2.5 mg daily.  Thanks.

## 2020-11-26 DIAGNOSIS — L814 Other melanin hyperpigmentation: Secondary | ICD-10-CM | POA: Diagnosis not present

## 2020-11-26 DIAGNOSIS — L438 Other lichen planus: Secondary | ICD-10-CM | POA: Diagnosis not present

## 2020-12-05 DIAGNOSIS — I471 Supraventricular tachycardia: Secondary | ICD-10-CM | POA: Insufficient documentation

## 2020-12-05 NOTE — Progress Notes (Signed)
Cardiology Office Note   Date:  12/07/2020   ID:  [facility], DOB [birth year], MRN [id number]  PCP:  [name], MD  Cardiologist:   Minus Breeding, MD   Chief Complaint  Patient presents with  . Hypertension      History of Present Illness: [facility] is a 75 y.o. female who presents for followup of atrial fib.  She had atrial fib with RVR in Sept and was in the ED.  She converted spontaneously.  She was followed in the atrial fib clinic.    She was started on Eliquis.  She did have some bleeding gums.  Of note she did have chest pain during her RVR.  She had a low risk POET (Plain Old Exercise Treadmill).    She was in the ED in late Nov 2021 with SVT.  She finally broke with adenosine 12 mg.  Since I last saw her she has been recording her blood pressure religiously and it was fairly high in January but seems to be coming down.  She has made some lifestyle changes to include salt restriction and better diet and increased activity.  She is also been diagnosed with sleep apnea and is going to be fitted with CPAP.  The patient denies any new symptoms such as chest discomfort, neck or arm discomfort. There has been no new shortness of breath, PND or orthopnea. There have been no reported palpitations, presyncope or syncope.  In particular she has had none of the palpitations with her previous tacky arrhythmias.  Past Medical History:  Diagnosis Date  . Allergy   . Arthritis    hands  . Asthma    rarely uses inhaler  . Atrial fibrillation ([place])   . Cataracts, bilateral   . Chicken pox   . Endometriosis   . Esophageal dysfunction   . GERD (gastroesophageal reflux disease)   . Glaucoma    surg for preventive for NARROW ANGLE  . Hematuria   . Hx of colonic polyp 06-12-2003   Colonoscopy Dr Amedeo Plenty  . Hyperlipemia   . Hypertension   . Lichen planus   . Osteopenia 05/2013   T score -2.3 FRAX 13%/2.7%  . Sleep apnea    Borderline.   Did not require sleeping.    Marland Kitchen SVT  (supraventricular tachycardia) ([place])   . Thyroid disease    nodule on thyroid  . Tubular adenoma of colon     Past Surgical History:  Procedure Laterality Date  . APPENDECTOMY    . BREAST CYST EXCISION  AGE 46, 23, 2   bilateral  . BREAST EXCISIONAL BIOPSY Bilateral   . CATARACT EXTRACTION    . CATARACT EXTRACTION     x2  . COLONOSCOPY    . DILATATION & CURETTAGE/HYSTEROSCOPY WITH TRUECLEAR N/A 02/22/2014   Procedure: DILATATION & CURETTAGE/HYSTEROSCOPY WITH TRUCLEAR;  Surgeon: [name], MD;  Location: [facility];  Service: Gynecology;  Laterality: N/A;  . ESOPHAGEAL MANOMETRY N/A 11/29/2012   Procedure: ESOPHAGEAL MANOMETRY (EM);  Surgeon: Sable Feil, MD;  Location: [facility];  Service: Endoscopy;  Laterality: N/A;  . 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 LAPAROSCOPIC ENDOMETRIOSIS FULGURATION  1972  . OOPHORECTOMY  1969   RSO  . POLYPECTOMY    . THYROIDECTOMY, PARTIAL  AGE 25  . TONSILLECTOMY    . UPPER GASTROINTESTINAL ENDOSCOPY  2013     Current Outpatient Medications  Medication Sig Dispense Refill  .  acetaminophen (TYLENOL 8 HOUR ARTHRITIS PAIN) 650 MG CR tablet Take 650 mg by mouth every 8 (eight) hours as needed for pain.    . Albuterol Sulfate (PROAIR HFA IN) Inhale into the lungs.    Marland Kitchen amLODipine (NORVASC) 2.5 MG tablet Take 1 tablet (2.5 mg total) by mouth daily. 90 tablet 3  . Calcium Citrate (CITRACAL PO) Take 2 tablets by mouth daily. 500 mg Calcium & 1000 units of Vitamin D    . chlorpheniramine (CHLOR-TRIMETON) 4 MG tablet Take 4 mg by mouth every 6 (six) hours as needed for allergies.    Marland Kitchen ELIQUIS 5 MG TABS tablet TAKE 1 TABLET BY MOUTH TWICE DAILY. 60 tablet 5  . esomeprazole (NEXIUM) 20 MG capsule Take 20 mg by mouth daily.     . metoprolol succinate (TOPROL-XL) 100 MG 24 hr tablet Take 1 tablet (100 mg total) by mouth daily. 90 tablet 3  . metroNIDAZOLE (FLAGYL) 500 MG tablet Take by mouth as needed.    .  NONFORMULARY OR COMPOUNDED ITEM Estradiol vaginal cream 0.02% insert 1 gram vaginally twice weekly (Patient taking differently: as needed. Estradiol vaginal cream 0.02% insert 1 gram vaginally twice weekly) 24 each 4  . pravastatin (PRAVACHOL) 40 MG tablet TAKE 1 TABLET BY MOUTH DAILY. 90 tablet 3   No current facility-administered medications for this visit.    Allergies:   Nitrofurantoin, Cortisone, Lisinopril, and Symbicort [budesonide-formoterol fumarate]    ROS:  Please see the history of present illness.   Otherwise, review of systems are positive for none All other systems are reviewed and negative.    PHYSICAL EXAM: VS:  BP 134/79   Pulse 66   Ht 5\' 2"  (1.575 m)   Wt 147 lb 9.6 oz (67 kg)   SpO2 99%   BMI 27.00 kg/m  , BMI Body mass index is 27 kg/m.  GENERAL:  Well appearing NECK:  No jugular venous distention, waveform within normal limits, carotid upstroke brisk and symmetric, no bruits, no thyromegaly LUNGS:  Clear to auscultation bilaterally CHEST:  Unremarkable HEART:  PMI not displaced or sustained,S1 and S2 within normal limits, no S3, no S4, no clicks, no rubs, no murmurs ABD:  Flat, positive bowel sounds normal in frequency in pitch, no bruits, no rebound, no guarding, no midline pulsatile mass, no hepatomegaly, no splenomegaly EXT:  2 plus pulses throughout, no edema, no cyanosis no clubbing   EKG:  EKG is not ordered today.   Recent Labs: 09/10/2020: BUN 28; Creatinine, Ser 1.02; Hemoglobin 13.2; Magnesium 1.8; Platelets 237; Potassium 3.9; Sodium 141    Lipid Panel    Component Value Date/Time   CHOL 168 01/28/2011 0855   TRIG 41.0 01/28/2011 0855   HDL 91.90 01/28/2011 0855   CHOLHDL 2 01/28/2011 0855   VLDL 8.2 01/28/2011 0855   LDLCALC 68 01/28/2011 0855      Wt Readings from Last 3 Encounters:  12/07/20 147 lb 9.6 oz (67 kg)  10/04/20 142 lb (64.4 kg)  10/03/20 148 lb (67.1 kg)      Other studies Reviewed: Additional studies/ records  that were reviewed today include: BP diary Review of the above records demonstrates:   See elsewhere   ASSESSMENT AND PLAN:  ATRIAL FIB:  Ms. [facility] has a CHA2DS2 - VASc score of 2.  She tolerates anticoagulation.  No change in therapy.  SLEEP APNEA: She is going to get fitted with CPAP and I will see what her blood pressure responses to this.  SVT:   Since increasing her metoprolol to 100 mg daily she has not had any increased symptoms.  No change in therapy.  She tolerates anticoagulation.   HTN:    The blood pressure is mildly elevated but fluctuates.  We are going to treat this with the CPAP as above and see how she does.   DYSLIPIDEMIA: Her LDL was slightly elevated but her HDL was very high so the ratio is good.  No change in therapy.   Current medicines are reviewed at length with the patient today.  The patient does not have concerns regarding medicines.  The following changes have been made: None  Labs/ tests ordered today include:   No orders of the defined types were placed in this encounter.    Disposition:   FU with me in 6 months.     Signed, Minus Breeding, MD  12/07/2020 10:06 AM    [facility]

## 2020-12-07 ENCOUNTER — Other Ambulatory Visit: Payer: Self-pay

## 2020-12-07 ENCOUNTER — Encounter: Payer: Self-pay | Admitting: Cardiology

## 2020-12-07 ENCOUNTER — Ambulatory Visit (INDEPENDENT_AMBULATORY_CARE_PROVIDER_SITE_OTHER): Payer: Medicare Other | Admitting: Cardiology

## 2020-12-07 VITALS — BP 134/79 | HR 66 | Ht 62.0 in | Wt 147.6 lb

## 2020-12-07 DIAGNOSIS — E785 Hyperlipidemia, unspecified: Secondary | ICD-10-CM | POA: Diagnosis not present

## 2020-12-07 DIAGNOSIS — I10 Essential (primary) hypertension: Secondary | ICD-10-CM

## 2020-12-07 DIAGNOSIS — I471 Supraventricular tachycardia: Secondary | ICD-10-CM | POA: Diagnosis not present

## 2020-12-07 DIAGNOSIS — I480 Paroxysmal atrial fibrillation: Secondary | ICD-10-CM | POA: Diagnosis not present

## 2020-12-07 NOTE — Patient Instructions (Signed)
Medication Instructions: No changes *If you need a refill on your cardiac medications before your next appointment, please call your pharmacy*  Follow-Up: At [facility], you and your health needs are our priority.  As part of our continuing mission to provide you with exceptional heart care, we have created designated Provider Care Teams.  These Care Teams include your primary Cardiologist (physician) and Advanced Practice Providers (APPs -  Physician Assistants and Nurse Practitioners) who all work together to provide you with the care you need, when you need it.  Your next appointment: 6 month(s) You will receive a reminder letter in the mail two months in advance. If you don't receive a letter, please call our office to schedule the follow-up appointment.  The format for your next appointment: In Person  Provider: James Hochrein, MD  

## 2020-12-17 DIAGNOSIS — R3121 Asymptomatic microscopic hematuria: Secondary | ICD-10-CM | POA: Diagnosis not present

## 2020-12-26 DIAGNOSIS — N281 Cyst of kidney, acquired: Secondary | ICD-10-CM | POA: Diagnosis not present

## 2020-12-26 DIAGNOSIS — R3121 Asymptomatic microscopic hematuria: Secondary | ICD-10-CM | POA: Diagnosis not present

## 2020-12-26 DIAGNOSIS — R3129 Other microscopic hematuria: Secondary | ICD-10-CM | POA: Diagnosis not present

## 2020-12-26 DIAGNOSIS — K449 Diaphragmatic hernia without obstruction or gangrene: Secondary | ICD-10-CM | POA: Diagnosis not present

## 2020-12-26 DIAGNOSIS — K769 Liver disease, unspecified: Secondary | ICD-10-CM | POA: Diagnosis not present

## 2020-12-31 DIAGNOSIS — R3121 Asymptomatic microscopic hematuria: Secondary | ICD-10-CM | POA: Diagnosis not present

## 2021-01-10 ENCOUNTER — Other Ambulatory Visit: Payer: Self-pay | Admitting: Internal Medicine

## 2021-01-10 DIAGNOSIS — K769 Liver disease, unspecified: Secondary | ICD-10-CM

## 2021-01-14 ENCOUNTER — Other Ambulatory Visit: Payer: Self-pay | Admitting: Cardiology

## 2021-01-17 ENCOUNTER — Other Ambulatory Visit: Payer: Self-pay | Admitting: Internal Medicine

## 2021-01-17 DIAGNOSIS — Z1231 Encounter for screening mammogram for malignant neoplasm of breast: Secondary | ICD-10-CM

## 2021-01-23 ENCOUNTER — Other Ambulatory Visit: Payer: Self-pay | Admitting: Cardiology

## 2021-01-29 ENCOUNTER — Other Ambulatory Visit: Payer: Self-pay

## 2021-01-29 ENCOUNTER — Ambulatory Visit
Admission: RE | Admit: 2021-01-29 | Discharge: 2021-01-29 | Disposition: A | Discharge status: Home / Self Care | Payer: Medicare Other | Source: Ambulatory Visit | Attending: Internal Medicine | Admitting: Internal Medicine

## 2021-01-29 DIAGNOSIS — K769 Liver disease, unspecified: Secondary | ICD-10-CM

## 2021-01-29 DIAGNOSIS — D1803 Hemangioma of intra-abdominal structures: Secondary | ICD-10-CM | POA: Diagnosis not present

## 2021-01-29 DIAGNOSIS — K7689 Other specified diseases of liver: Secondary | ICD-10-CM | POA: Diagnosis not present

## 2021-01-29 MED ORDER — GADOBENATE DIMEGLUMINE 529 MG/ML IV SOLN
13.0000 mL | Freq: Once | INTRAVENOUS | Status: AC | PRN
  Administered 2021-01-29: 13 mL via INTRAVENOUS

## 2021-01-31 DIAGNOSIS — Z23 Encounter for immunization: Secondary | ICD-10-CM | POA: Diagnosis not present

## 2021-02-25 ENCOUNTER — Ambulatory Visit: Payer: Self-pay | Admitting: Neurology

## 2021-03-11 ENCOUNTER — Ambulatory Visit
Admission: RE | Admit: 2021-03-11 | Discharge: 2021-03-11 | Disposition: A | Discharge status: Home / Self Care | Payer: Medicare Other | Source: Ambulatory Visit | Attending: Internal Medicine | Admitting: Internal Medicine

## 2021-03-11 ENCOUNTER — Other Ambulatory Visit: Payer: Self-pay

## 2021-03-11 DIAGNOSIS — Z1231 Encounter for screening mammogram for malignant neoplasm of breast: Secondary | ICD-10-CM | POA: Diagnosis not present

## 2021-03-27 ENCOUNTER — Encounter: Payer: Self-pay | Admitting: Neurology

## 2021-04-01 ENCOUNTER — Other Ambulatory Visit: Payer: Self-pay

## 2021-04-01 ENCOUNTER — Ambulatory Visit (INDEPENDENT_AMBULATORY_CARE_PROVIDER_SITE_OTHER): Payer: Medicare Other | Admitting: Neurology

## 2021-04-01 ENCOUNTER — Encounter: Payer: Self-pay | Admitting: Neurology

## 2021-04-01 VITALS — BP 129/84 | HR 68 | Ht 62.0 in | Wt 147.3 lb

## 2021-04-01 DIAGNOSIS — G4733 Obstructive sleep apnea (adult) (pediatric): Secondary | ICD-10-CM | POA: Diagnosis not present

## 2021-04-01 DIAGNOSIS — Z9989 Dependence on other enabling machines and devices: Secondary | ICD-10-CM

## 2021-04-01 NOTE — Patient Instructions (Signed)
You have done a great job using your AutoPap machine.  Your leak from the mask is on the higher side.  Please talk to your DME company, [employer] about the mask refit.    Please continue using your autoPAP regularly. While your insurance requires that you use PAP at least 4 hours each night on 70% of the nights, I recommend, that you not skip any nights and use it throughout the night if you can. Getting used to PAP and staying with the treatment long term does take time and patience and discipline. Untreated obstructive sleep apnea when it is moderate to severe can have an adverse impact on cardiovascular health and raise her risk for heart disease, arrhythmias, hypertension, congestive heart failure, stroke and diabetes. Untreated obstructive sleep apnea causes sleep disruption, nonrestorative sleep, and sleep deprivation. This can have an impact on your day to day functioning and cause daytime sleepiness and impairment of cognitive function, memory loss, mood disturbance, and problems focussing. Using PAP regularly can improve these symptoms.  Please follow-up routinely in 6 months to see one of our nurse practitioners.  If you continue to do well and are compliant with your AutoPap at the time, we can see you yearly after that.

## 2021-04-01 NOTE — Progress Notes (Signed)
Subjective:    Patient ID: Meagan Key is a 75 y.o. female.  HPI    Interim history:   Ms. Meagan Key is a 75 year old right-handed woman with an underlying medical history of MGUS, hypertension, hyperlipidemia, asthma, vitamin D deficiency, paroxysmal A. fib, glaucoma, reflux disease, osteopenia, SVT, and mildly overweight state, who presents for follow-up consultation of her obstructive sleep apnea after interim testing and starting AutoPap therapy.  The patient is unaccompanied today.  I first met her at the request of her primary care physician on 10/03/2020, at which time she reported snoring and excessive daytime somnolence.  She had a prior diagnosis of mild obstructive sleep apnea in 2014.  She was advised to proceed with sleep testing.  She had a home sleep test on 10/31/2020 which indicated severe obstructive sleep apnea with an AHI of 39.4/h, O2 nadir 79%.  She was advised to start AutoPap therapy at home.  Her set up date was 01/02/2021.  Today, 04/01/2021: I reviewed her AutoPap compliance data from 02/26/2021 through 03/27/2021, which is a total of 30 days, during which time she used her machine every night with percent use days greater than 4 hours at 97%, indicating excellent compliance with an average usage of 5 hours and 4 minutes, residual AHI 4/h, on the higher end of the goal, 95th percentile of pressure at 11.7 cm with a range of 7 to 12 cm with EPR, leak on the higher side with the 95th percentile at 34.4 L/min.  She reports doing fairly well, she has adjusted to treatment, feels like it is somewhat helpful, she has been using the nasal cushion interface but has had some trouble getting a replacement every month and also a replacement of her filter.  She eventually received supplies after calling her DME company several times.  She is overall motivated to continue with treatment.  She does sleep about 7 to 8 hours on average and admits to taking off her mask once she has achieved more  than 4 hours of usage as she does have some discomfort and air leaks from time to time and dislodging of the mask is cumbersome for her.  She feels that the seal is best when she sleeps on her back but she gets tired sleeping on her back.  Per husband's feedback, her snoring has improved, her nocturia is also better.  The patient's allergies, current medications, family history, past medical history, past social history, past surgical history and problem list were reviewed and updated as appropriate.   Previously:   10/03/20: (She) reports snoring and some excessive daytime somnolence.  I reviewed your office note from 08/16/2020.  She had blood work through your office at the time including CMP, CBC, lipid panel, TSH, vitamin D, free T4.  Lipid panel showed a total cholesterol of 207, HDL 74, triglycerides 84, LDL 116, TSH was normal, vitamin D 46.8. She has woken up with palpitations at night.   She had a home sleep test on 11/22/2012 which showed mild obstructive sleep apnea.  She has not been on CPAP therapy.  I was able to review her home sleep test report, total AHI was 10.8/h, desaturation nadir 82%.  Her Epworth sleepiness score is 11 out of 24, fatigue severity score is 37 out of 63.   Of note, she presented to the emergency room on 09/10/2020 with chest pain and palpitations. She was found to be in SVT, vagal maneuvers were not successful, she was treated with adenosine which was successful.  She was previously diagnosed with paroxysmal A. fib and placed on Eliquis in 2019. She has been following with Dr. Percival Spanish and cardiology. She has an appointment tomorrow.   When she went to the ER, she had entertained several guests at home and also had some alcohol, which is more than what she usually has. She had 3 alcoholic beverages, tries to limit herself to 1 glass of white wine per week on average. She has limited her caffeine and has gone to have calf coffee. She works part-time, she is a  Chief Executive Officer. She lives with her husband and they have 2 grown children, no pets in the house, TV in the bedroom but do not utilize it typically. She is in bed by 10 and rise time is around 6 or 630 typically. She denies recurrent morning headaches but has significant nocturia, at least 2 or 3 times per average night. Her husband has a CPAP machine. She would consider it.  Her Past Medical History Is Significant For: Past Medical History:  Diagnosis Date   Allergy    Arthritis    hands   Asthma    rarely uses inhaler   Atrial fibrillation ([place])    Cataracts, bilateral    Chicken pox    Endometriosis    Esophageal dysfunction    GERD (gastroesophageal reflux disease)    Glaucoma    surg for preventive for NARROW ANGLE   Hematuria    Hx of colonic polyp 06-12-2003   Colonoscopy Dr Amedeo Plenty   Hyperlipemia    Hypertension    Lichen planus    Osteopenia 05/2013   T score -2.3 FRAX 13%/2.7%   Sleep apnea    Borderline.   Did not require sleeping.     SVT (supraventricular tachycardia) (HCC)    Thyroid disease    nodule on thyroid   Tubular adenoma of colon     Her Past Surgical History Is Significant For: Past Surgical History:  Procedure Laterality Date   APPENDECTOMY     BREAST CYST EXCISION  AGE 20, 23, 72   bilateral   BREAST EXCISIONAL BIOPSY Bilateral    CATARACT EXTRACTION     CATARACT EXTRACTION     x2   COLONOSCOPY     DILATATION & CURETTAGE/HYSTEROSCOPY WITH TRUECLEAR N/A 02/22/2014   Procedure: DILATATION & CURETTAGE/HYSTEROSCOPY WITH TRUCLEAR;  Surgeon: [name], MD;  Location: [facility];  Service: Gynecology;  Laterality: N/A;   ESOPHAGEAL MANOMETRY N/A 11/29/2012   Procedure: ESOPHAGEAL MANOMETRY (EM);  Surgeon: Sable Feil, MD;  Location: [facility];  Service: Endoscopy;  Laterality: N/A;   EYE SURGERY  2010   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   LAPAROSCOPIC ENDOMETRIOSIS FULGURATION  1972   OOPHORECTOMY  1969   RSO   POLYPECTOMY      THYROIDECTOMY, PARTIAL  AGE 30   TONSILLECTOMY     UPPER GASTROINTESTINAL ENDOSCOPY  2013    Her Family History Is Significant For: Family History  Problem Relation Age of Onset   Colon cancer Mother 28   Alcohol abuse Mother    Mental illness Mother    Lung cancer Father        smoker   Alcohol abuse Father    Hypertension Father    Colon cancer Father 69   Breast cancer Maternal Aunt        Age 71   Heart attack Paternal Grandmother    Other Cousin        mother side-vocal  cord cancer   Breast cancer Cousin 78    Her Social History Is Significant For: Social History   Socioeconomic History   Marital status: Married    Spouse name: Not on file   Number of children: 2   Years of education: Not on file   Highest education level: Not on file  Occupational History   Occupation: attorney    Employer: [employer]  Tobacco Use   Smoking status: Former    Packs/day: 0.30    Years: 25.00    Pack years: 7.50    Types: Cigarettes    Quit date: 09/14/1984    Years since quitting: 36.5   Smokeless tobacco: Never  Vaping Use   Vaping Use: Never used  Substance and Sexual Activity   Alcohol use: Yes    Comment: once a week   Drug use: No   Sexual activity: Never    Birth control/protection: Post-menopausal    Comment: 1st intercourse 75 yo-Fewer than 5 partners  Other Topics Concern   Not on file  Social History Narrative   MARRIED; 2 CHILDREN   LAW DEGREE; ATTORNEY   Social Determinants of Health   Financial Resource Strain: Not on file  Food Insecurity: Not on file  Transportation Needs: Not on file  Physical Activity: Not on file  Stress: Not on file  Social Connections: Not on file    His Allergies Are:  Allergies  Allergen Reactions   Nitrofurantoin Shortness Of Breath and Other (See Comments)    Itching in throat    Cortisone     A-fib   Lisinopril Itching and Other (See Comments)    SOB and itching is in throat   Symbicort  [Budesonide-Formoterol Fumarate]     A-fib  :   His Current Medications Are:  Outpatient Encounter Medications as of 04/01/2021  Medication Sig   acetaminophen (TYLENOL) 650 MG CR tablet Take 650 mg by mouth every 8 (eight) hours as needed for pain.   Albuterol Sulfate (PROAIR HFA IN) Inhale into the lungs.   Calcium Citrate (CITRACAL PO) Take 2 tablets by mouth daily. 500 mg Calcium & 1000 units of Vitamin D   chlorpheniramine (CHLOR-TRIMETON) 4 MG tablet Take 4 mg by mouth every 6 (six) hours as needed for allergies.   ELIQUIS 5 MG TABS tablet TAKE 1 TABLET BY MOUTH TWICE DAILY.   esomeprazole (NEXIUM) 20 MG capsule Take 20 mg by mouth daily.    metoprolol succinate (TOPROL-XL) 100 MG 24 hr tablet Take 1 tablet (100 mg total) by mouth daily.   metroNIDAZOLE (FLAGYL) 500 MG tablet Take by mouth as needed.   NONFORMULARY OR COMPOUNDED ITEM Estradiol vaginal cream 0.02% insert 1 gram vaginally twice weekly (Patient taking differently: as needed. Estradiol vaginal cream 0.02% insert 1 gram vaginally twice weekly)   nystatin (MYCOSTATIN) 100000 UNIT/ML suspension Take 5 mLs by mouth as needed.   pravastatin (PRAVACHOL) 40 MG tablet TAKE 1 TABLET BY MOUTH DAILY.   triamcinolone cream (KENALOG) 0.1 % Apply 1 application topically as needed.   amLODipine (NORVASC) 2.5 MG tablet Take 1 tablet (2.5 mg total) by mouth daily.   No facility-administered encounter medications on file as of 04/01/2021.  :  Review of Systems:  Out of a complete 14 point review of systems, all are reviewed and negative with the exception of these symptoms as listed below:  Review of Systems  Neurological:        Here for f/u on autopap. Reports she  she is still trying to get used to the machine.    Objective:  Neurological Exam  Physical Exam Physical Examination:   Vitals:   04/01/21 0931  BP: 129/84  Pulse: 68    General Examination: The patient is a very pleasant 75 y.o. female in no acute distress. She  appears well-developed and well-nourished and very well groomed.   HEENT: Normocephalic, atraumatic, pupils are equal, round and reactive to light, extraocular tracking is good without limitation to gaze excursion or nystagmus noted. Hearing is grossly intact. Face is symmetric with normal facial animation. Speech is clear with no dysarthria noted. There is no hypophonia. There is no lip, neck/head, jaw or voice tremor. Neck is supple with full range of passive and active motion. There are no carotid bruits on auscultation. Oropharynx exam reveals: mild mouth dryness, good dental hygiene and mild airway crowding, due to small airway entry and redundant soft palate, tonsils absent.  Mallampati class II, neck circumference of 14 mm inches.  She has a mild to moderate overbite.  Tongue protrudes centrally and palate elevates symmetrically.   Chest: Clear to auscultation without wheezing, rhonchi or crackles noted.   Heart: S1+S2+0, regular and normal without murmurs, rubs or gallops noted.   Abdomen: Soft, non-tender and non-distended with normal bowel sounds appreciated on auscultation.   Extremities: There is no pitting edema in the distal lower extremities bilaterally.   Skin: Warm and dry without trophic changes noted.   Musculoskeletal: exam reveals no obvious joint deformities, tenderness or joint swelling or erythema.   Neurologically: Mental status: The patient is awake, alert and oriented in all 4 spheres. Her immediate and remote memory, attention, language skills and fund of knowledge are appropriate. There is no evidence of aphasia, agnosia, apraxia or anomia. Speech is clear with normal prosody and enunciation. Thought process is linear. Mood is normal and affect is normal. Cranial nerves II - XII are as described above under HEENT exam. Motor exam: Normal bulk, strength and tone is noted. There is no tremor, fine motor skills and coordination: grossly intact. Cerebellar testing: No  dysmetria or intention tremor. There is no truncal or gait ataxia. Sensory exam: intact to light touch in the upper and lower extremities. Gait, station and balance: She stands easily. No veering to one side is noted. No leaning to one side is noted. Posture is age-appropriate and stance is narrow based. Gait shows normal stride length and normal pace. No problems turning are noted.   Assessment and Plan:  In summary, QUIDA GLASSER is a very pleasant 75 y.o.-year old female with an underlying medical history of MGUS, hypertension, hyperlipidemia, asthma, vitamin D deficiency, paroxysmal A. fib, glaucoma, reflux disease, osteopenia, SVT, and mildly overweight state, who presents for follow-up consultation of her obstructive sleep apnea, which was deemed in the severe range by home sleep testing on 10/31/2020, apnea-hypopnea index was 39.4/h and O2 nadir 79%.  She established treatment at home with AutoPap therapy since 01/02/2021.  She is compliant with treatment and commended for it.  She has noted some benefit thus far but is struggling to keep her mask on throughout the night.  She is motivated to continue with treatment and will try to keep it on all night if possible.  She has been using a nasal cushion interface which does dislodge and there is a considerable leak, some nights worse than others.  She is advised to talk to her DME company about her supplies as well as the possibility  of trying nasal pillows.  She is not sure that she wants to try a different mask quite yet.  She is advised to follow-up routinely to see one of our nurse practitioners at this time in about 6 months, sooner if needed.  If she is stable and doing well at the time, we can see her yearly thereafter.  I answered all her questions today and we reviewed her sleep test results from her home sleep test as well as her compliance data in detail today.  She was in agreement with the plan.   I spent 30 minutes in total face-to-face time  and in reviewing records during pre-charting, more than 50% of which was spent in counseling and coordination of care, reviewing test results, reviewing medications and treatment regimen and/or in discussing or reviewing the diagnosis of OSA, the prognosis and treatment options. Pertinent laboratory and imaging test results that were available during this visit with the patient were reviewed by me and considered in my medical decision making (see chart for details).

## 2021-04-07 NOTE — Progress Notes (Signed)
Cardiology Office Note   Date:  04/08/2021   ID:  Meagan Key, DOB [birth year], MRN [id number]  PCP:  Meagan Baton, MD  Cardiologist:   Meagan Breeding, MD   Chief Complaint  Patient presents with   Atrial Fibrillation       History of Present Illness: Meagan Key is a 75 y.o. female who presents for followup of atrial fib.  She had atrial fib with RVR in Sept and was in the ED.  She converted spontaneously.  She was followed in the atrial fib clinic.    She was started on Eliquis.  She did have some bleeding gums.  Of note she did have chest pain during her RVR.  She had a low risk POET (Plain Old Exercise Treadmill).    Since I last saw her she has had no further palpitations. The patient denies any new symptoms such as chest discomfort, neck or arm discomfort. There has been no new shortness of breath, PND or orthopnea. There have been no reported palpitations, presyncope or syncope.  Her blood pressure is staying elevated however.  She has not been exercising as much since the pandemic but she is starting to a little bit.   Past Medical History:  Diagnosis Date   Allergy    Arthritis    hands   Asthma    rarely uses inhaler   Atrial fibrillation (HCC)    Cataracts, bilateral    Chicken pox    Endometriosis    Esophageal dysfunction    GERD (gastroesophageal reflux disease)    Glaucoma    surg for preventive for NARROW ANGLE   Hematuria    Hx of colonic polyp 06-12-2003   Colonoscopy Dr Meagan Key   Hyperlipemia    Hypertension    Lichen planus    Osteopenia 05/2013   T score -2.3 FRAX 13%/2.7%   Sleep apnea    Borderline.   Did not require sleeping.     SVT (supraventricular tachycardia) (HCC)    Thyroid disease    nodule on thyroid   Tubular adenoma of colon     Past Surgical History:  Procedure Laterality Date   APPENDECTOMY     BREAST CYST EXCISION  AGE 28, 23, 56   bilateral   BREAST EXCISIONAL BIOPSY Bilateral    CATARACT EXTRACTION     CATARACT  EXTRACTION     x2   COLONOSCOPY     DILATATION & CURETTAGE/HYSTEROSCOPY WITH TRUECLEAR N/A 02/22/2014   Procedure: DILATATION & CURETTAGE/HYSTEROSCOPY WITH TRUCLEAR;  Surgeon: Meagan Auerbach, MD;  Location: [facility];  Service: Gynecology;  Laterality: N/A;   ESOPHAGEAL MANOMETRY N/A 11/29/2012   Procedure: ESOPHAGEAL MANOMETRY (EM);  Surgeon: Meagan Feil, MD;  Location: [facility];  Service: Endoscopy;  Laterality: N/A;   EYE SURGERY  2010   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     Albuterol Sulfate (PROAIR HFA IN) Inhale into the lungs.     Calcium Citrate (CITRACAL PO) Take 2 tablets by mouth daily. 500 mg Calcium & 1000 units of  Vitamin D     chlorpheniramine (CHLOR-TRIMETON) 4 MG tablet Take 4 mg by mouth every 6 (six) hours as needed for allergies.     ELIQUIS 5 MG TABS tablet TAKE 1 TABLET BY MOUTH TWICE DAILY. 180 tablet 1   esomeprazole (NEXIUM) 20 MG capsule Take 20 mg by mouth daily.      metoprolol succinate (TOPROL-XL) 100 MG 24 hr tablet Take 1 tablet (100 mg total) by mouth daily. 90 tablet 3   metroNIDAZOLE (FLAGYL) 500 MG tablet Take by mouth as needed.     NONFORMULARY OR COMPOUNDED ITEM Estradiol vaginal cream 0.02% insert 1 gram vaginally twice weekly (Patient taking differently: as needed. Estradiol vaginal cream 0.02% insert 1 gram vaginally twice weekly) 24 each 4   nystatin (MYCOSTATIN) 100000 UNIT/ML suspension Take 5 mLs by mouth as needed.     pravastatin (PRAVACHOL) 40 MG tablet TAKE 1 TABLET BY MOUTH DAILY. 90 tablet 3   triamcinolone cream (KENALOG) 0.1 % Apply 1 application topically as  needed.     amLODipine (NORVASC) 5 MG tablet Take 1 tablet (5 mg total) by mouth daily. 90 tablet 3   No current facility-administered medications for this visit.    Allergies:   Nitrofurantoin, Cortisone, Lisinopril, and Symbicort [budesonide-formoterol fumarate]    ROS:  Please see the history of present illness.   Otherwise, review of systems are positive for none.   All other systems are reviewed and negative.    PHYSICAL EXAM: VS:  BP (!) 156/90   Pulse 66   Ht 5\' 2"  (1.575 m)   Wt 146 lb 12.8 oz (66.6 kg)   SpO2 98%   BMI 26.85 kg/m  , BMI Body mass index is 26.85 kg/m.  GENERAL:  Well appearing NECK:  No jugular venous distention, waveform within normal limits, carotid upstroke brisk and symmetric, no bruits, no thyromegaly LUNGS:  Clear to auscultation bilaterally CHEST:  Unremarkable HEART:  PMI not displaced or sustained,S1 and S2 within normal limits, no S3, no S4, no clicks, no rubs, no murmurs ABD:  Flat, positive bowel sounds normal in frequency in pitch, no bruits, no rebound, no guarding, no midline pulsatile mass, no hepatomegaly, no splenomegaly EXT:  2 plus pulses throughout, no edema, no cyanosis no clubbing   EKG:  EKG is  ordered today. Sinus rhythm, rate 66, axis within normal limits, intervals within normal limits, no acute ST-T wave changes.  Recent Labs: 09/10/2020: BUN 28; Creatinine, Ser 1.02; Hemoglobin 13.2; Magnesium 1.8; Platelets 237; Potassium 3.9; Sodium 141    Lipid Panel    Component Value Date/Time   CHOL 168 01/28/2011 0855   TRIG 41.0 01/28/2011 0855   HDL 91.90 01/28/2011 0855   CHOLHDL 2 01/28/2011 0855   VLDL 8.2 01/28/2011 0855   LDLCALC 68 01/28/2011 0855      Wt Readings from Last 3 Encounters:  04/08/21 146 lb 12.8 oz (66.6 kg)  04/01/21 147 lb 5 oz (66.8 kg)  12/07/20 147 lb 9.6 oz (67 kg)      Other studies Reviewed: Additional studies/ records that were reviewed today include:  None Review of the above records  demonstrates:   See elsewhere   ASSESSMENT AND PLAN:  ATRIAL FIB:  Ms. Meagan Key has a CHA2DS2 - VASc score of 2.  She is having no further arrhythmias.  No change in therapy.  She is tolerating her blood thinner and is up-to-date with blood work and on the appropriate dose.  SLEEP APNEA:  She is up to about 4 hours on night with her CPAP.   No change in therapy.   SVT:     She is having no further episodes of this.  No change in therapy.   HTN:    The blood pressure is elevated and I am going to increase her amlodipine to 5 mg daily.  She has a little bit of a rash on her arm she thought related to this and will let me know if this gets worse.  She will keep a blood pressure diary and we will make further titrations based on this.   DYSLIPIDEMIA: Her LDL was 116 with an HDL of 74.  No change in therapy.   Current medicines are reviewed at length with the patient today.  The patient does not have concerns regarding medicines.  The following changes have been made: As above  Labs/ tests ordered today include: None  Orders Placed This Encounter  Procedures   EKG 12-Lead      Disposition:   FU with me in 12 months.     Signed, Meagan Breeding, MD  04/08/2021 12:08 PM    [facility]

## 2021-04-08 ENCOUNTER — Ambulatory Visit (INDEPENDENT_AMBULATORY_CARE_PROVIDER_SITE_OTHER): Payer: Medicare Other | Admitting: Cardiology

## 2021-04-08 ENCOUNTER — Other Ambulatory Visit: Payer: Self-pay

## 2021-04-08 ENCOUNTER — Encounter: Payer: Self-pay | Admitting: Cardiology

## 2021-04-08 VITALS — BP 156/90 | HR 66 | Ht 62.0 in | Wt 146.8 lb

## 2021-04-08 DIAGNOSIS — I480 Paroxysmal atrial fibrillation: Secondary | ICD-10-CM

## 2021-04-08 DIAGNOSIS — I471 Supraventricular tachycardia: Secondary | ICD-10-CM

## 2021-04-08 DIAGNOSIS — E785 Hyperlipidemia, unspecified: Secondary | ICD-10-CM

## 2021-04-08 DIAGNOSIS — I10 Essential (primary) hypertension: Secondary | ICD-10-CM | POA: Diagnosis not present

## 2021-04-08 MED ORDER — AMLODIPINE BESYLATE 5 MG PO TABS: 5.0000 mg | ORAL_TABLET | Freq: Every day | ORAL | 3 refills | Status: DC

## 2021-04-08 NOTE — Patient Instructions (Signed)
Medication Instructions:  Increase Norvasc to 5 mg daily   *If you need a refill on your cardiac medications before your next appointment, please call your pharmacy*   Follow-Up: At [facility], you and your health needs are our priority.  As part of our continuing mission to provide you with exceptional heart care, we have created designated Provider Care Teams.  These Care Teams include your primary Cardiologist (physician) and Advanced Practice Providers (APPs -  Physician Assistants and Nurse Practitioners) who all work together to provide you with the care you need, when you need it.  We recommend signing up for the patient portal called "MyChart".  Sign up information is provided on this After Visit Summary.  MyChart is used to connect with patients for Virtual Visits (Telemedicine).  Patients are able to view lab/test results, encounter notes, upcoming appointments, etc.  Non-urgent messages can be sent to your provider as well.   To learn more about what you can do with MyChart, go to [email or web address].    Your next appointment:   12 month(s)  The format for your next appointment:   In Person  Provider:   Minus Breeding, MD

## 2021-05-10 DIAGNOSIS — D1801 Hemangioma of skin and subcutaneous tissue: Secondary | ICD-10-CM | POA: Diagnosis not present

## 2021-05-10 DIAGNOSIS — L438 Other lichen planus: Secondary | ICD-10-CM | POA: Insufficient documentation

## 2021-07-02 DIAGNOSIS — Z23 Encounter for immunization: Secondary | ICD-10-CM | POA: Diagnosis not present

## 2021-07-11 ENCOUNTER — Other Ambulatory Visit: Payer: Self-pay

## 2021-07-11 ENCOUNTER — Ambulatory Visit (INDEPENDENT_AMBULATORY_CARE_PROVIDER_SITE_OTHER): Payer: Medicare Other | Admitting: Podiatry

## 2021-07-11 ENCOUNTER — Encounter: Payer: Self-pay | Admitting: Podiatry

## 2021-07-11 DIAGNOSIS — B351 Tinea unguium: Secondary | ICD-10-CM

## 2021-07-11 DIAGNOSIS — L600 Ingrowing nail: Secondary | ICD-10-CM

## 2021-07-11 NOTE — Patient Instructions (Signed)

## 2021-07-11 NOTE — Progress Notes (Signed)
Subjective:   Patient ID: Meagan Key, female   DOB: 75 y.o.   MRN: [id number]   HPI Patient presents with a very painful ingrown toenail right big toe and all her nails just naturally get incurvated but this months been sore and making shoe gear difficult.  Patient does not smoke likes to be active     Review of Systems  All other systems reviewed and are negative.      Objective:  Physical Exam Vitals and nursing note reviewed.  Constitutional:      Appearance: She is well-developed.  Pulmonary:     Effort: Pulmonary effort is normal.  Musculoskeletal:        General: Normal range of motion.  Skin:    General: Skin is warm.  Neurological:     Mental Status: She is alert.    Neurovascular status intact muscle strength found to be adequate range of motion adequate with incurvated right hallux lateral border painful when pressed making shoe gear difficult.  Patient has slight redness no active drainage associated with it and it is tender and has structural nail disease.  Patient does have good digital perfusion well oriented     Assessment:  Ingrown toenail deformity right hallux lateral border with pain along with structural changes to all nailbeds     Plan:  H&P reviewed all conditions and educated her on this.  I recommend removal of the nail border I explained procedure risk the patient wants surgery and today I went ahead and after signing consent form I infiltrated the right hallux 60 mg like Marcaine mixture sterile prep done and using sterile instrumentation removed the lateral border exposed matrix applied phenol 3 applications 30 seconds followed by alcohol lavage sterile dressing gave instructions on soaks and reappoint to recheck.  Debrided all remaining nailbeds no iatrogenic bleeding

## 2021-07-25 DIAGNOSIS — Z23 Encounter for immunization: Secondary | ICD-10-CM | POA: Diagnosis not present

## 2021-08-08 ENCOUNTER — Other Ambulatory Visit: Payer: Self-pay | Admitting: Cardiology

## 2021-08-08 NOTE — Telephone Encounter (Signed)
Prescription refill request for Eliquis received. Indication:afib Last office visit:hochrein 04/08/21 Scr: 1.02 09/10/20 Age: 62f Weight:66.6kg

## 2021-08-09 DIAGNOSIS — H40023 Open angle with borderline findings, high risk, bilateral: Secondary | ICD-10-CM | POA: Diagnosis not present

## 2021-08-09 DIAGNOSIS — H538 Other visual disturbances: Secondary | ICD-10-CM | POA: Diagnosis not present

## 2021-08-27 DIAGNOSIS — E049 Nontoxic goiter, unspecified: Secondary | ICD-10-CM | POA: Diagnosis not present

## 2021-08-27 DIAGNOSIS — I10 Essential (primary) hypertension: Secondary | ICD-10-CM | POA: Diagnosis not present

## 2021-08-27 DIAGNOSIS — E559 Vitamin D deficiency, unspecified: Secondary | ICD-10-CM | POA: Diagnosis not present

## 2021-08-27 DIAGNOSIS — E785 Hyperlipidemia, unspecified: Secondary | ICD-10-CM | POA: Diagnosis not present

## 2021-09-03 DIAGNOSIS — M1990 Unspecified osteoarthritis, unspecified site: Secondary | ICD-10-CM | POA: Diagnosis not present

## 2021-09-03 DIAGNOSIS — M8580 Other specified disorders of bone density and structure, unspecified site: Secondary | ICD-10-CM | POA: Diagnosis not present

## 2021-09-03 DIAGNOSIS — I480 Paroxysmal atrial fibrillation: Secondary | ICD-10-CM | POA: Diagnosis not present

## 2021-09-03 DIAGNOSIS — I700 Atherosclerosis of aorta: Secondary | ICD-10-CM | POA: Diagnosis not present

## 2021-09-03 DIAGNOSIS — I10 Essential (primary) hypertension: Secondary | ICD-10-CM | POA: Diagnosis not present

## 2021-09-03 DIAGNOSIS — D472 Monoclonal gammopathy: Secondary | ICD-10-CM | POA: Diagnosis not present

## 2021-09-03 DIAGNOSIS — Z7901 Long term (current) use of anticoagulants: Secondary | ICD-10-CM | POA: Diagnosis not present

## 2021-09-03 DIAGNOSIS — Z1331 Encounter for screening for depression: Secondary | ICD-10-CM | POA: Diagnosis not present

## 2021-09-03 DIAGNOSIS — Z23 Encounter for immunization: Secondary | ICD-10-CM | POA: Diagnosis not present

## 2021-09-03 DIAGNOSIS — I2721 Secondary pulmonary arterial hypertension: Secondary | ICD-10-CM | POA: Diagnosis not present

## 2021-09-03 DIAGNOSIS — I77819 Aortic ectasia, unspecified site: Secondary | ICD-10-CM | POA: Diagnosis not present

## 2021-09-03 DIAGNOSIS — E785 Hyperlipidemia, unspecified: Secondary | ICD-10-CM | POA: Diagnosis not present

## 2021-09-03 DIAGNOSIS — Z0000 Encounter for general adult medical examination without abnormal findings: Secondary | ICD-10-CM | POA: Diagnosis not present

## 2021-09-03 DIAGNOSIS — Z1212 Encounter for screening for malignant neoplasm of rectum: Secondary | ICD-10-CM | POA: Diagnosis not present

## 2021-09-03 DIAGNOSIS — R82998 Other abnormal findings in urine: Secondary | ICD-10-CM | POA: Diagnosis not present

## 2021-09-03 DIAGNOSIS — Z1389 Encounter for screening for other disorder: Secondary | ICD-10-CM | POA: Diagnosis not present

## 2021-09-03 DIAGNOSIS — J45909 Unspecified asthma, uncomplicated: Secondary | ICD-10-CM | POA: Diagnosis not present

## 2021-09-18 ENCOUNTER — Telehealth: Payer: Self-pay | Admitting: Adult Health

## 2021-09-18 NOTE — Telephone Encounter (Signed)
Megan NP out 12/14, LVM on cell & work #s (home # out of service), sent [place] requesting cb to r/s.

## 2021-09-30 DIAGNOSIS — N952 Postmenopausal atrophic vaginitis: Secondary | ICD-10-CM | POA: Diagnosis not present

## 2021-09-30 DIAGNOSIS — M8589 Other specified disorders of bone density and structure, multiple sites: Secondary | ICD-10-CM | POA: Diagnosis not present

## 2021-10-02 ENCOUNTER — Ambulatory Visit: Payer: Medicare Other | Admitting: Adult Health

## 2021-10-02 NOTE — Progress Notes (Signed)
75 y.o. G95P2002 Married Caucasian female here for annual exam.   This could not be completed today due to inability to do full GYN exam.   See below.   She denies vaginal bleeding.   Used vaginal estrogen cream for reduce frequency of urinary tract infections.  Not using any further but her PCP recommended she continue this.  She currently does not intend to use it.   Has atrial fibrillation and is on Eliquis.  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 Enjoys golf.  Living at [facility].  PCP: [name], MD    No LMP recorded. Patient is postmenopausal.           Sexually active: No.  The current method of family planning is post menopausal status.    Exercising: Yes.     Walking/body weights  Smoker:  no  Health Maintenance: Pap: 09/23/18- WNL, 09/15/16- WNL, 09/12/15- WNL History of abnormal Pap:  no MMG: 03/11/21- birads 1 neg  Colonoscopy: 2021-WNL per pt  BMD: 09/30/21  Result pending.  TDaP: 02/17/13 Gardasil:   no HIV: never Hep C: never  Screening Labs:  PCP.   reports that she quit smoking about 37 years ago. Her smoking use included cigarettes. She has a 7.50 pack-year smoking history. She has never used smokeless tobacco. She reports current alcohol use. She reports that she does not use drugs.  Past Medical History:  Diagnosis Date   Allergy    Arthritis    hands   Asthma    rarely uses inhaler   Atrial fibrillation (HCC)    Cataracts, bilateral    Chicken pox    Endometriosis    Esophageal dysfunction    GERD (gastroesophageal reflux disease)    Glaucoma    surg for preventive for NARROW ANGLE   Hematuria    Hx of colonic polyp 06-12-2003   Colonoscopy Dr Amedeo Plenty   Hyperlipemia    Hypertension    Lichen planus    Osteopenia 05/2013   T score -2.3 FRAX 13%/2.7%   Sleep apnea    Borderline.   Did not require sleeping.     SVT (supraventricular tachycardia) (HCC)    Thyroid disease    nodule on thyroid   Tubular  adenoma of colon     Past Surgical History:  Procedure Laterality Date   APPENDECTOMY     BREAST CYST EXCISION  AGE 20, 23, 56   bilateral   BREAST EXCISIONAL BIOPSY Bilateral    CATARACT EXTRACTION     CATARACT EXTRACTION     x2   COLONOSCOPY     DILATATION & CURETTAGE/HYSTEROSCOPY WITH TRUECLEAR N/A 02/22/2014   Procedure: DILATATION & CURETTAGE/HYSTEROSCOPY WITH TRUCLEAR;  Surgeon: [name], MD;  Location: [facility];  Service: Gynecology;  Laterality: N/A;   ESOPHAGEAL MANOMETRY N/A 11/29/2012   Procedure: ESOPHAGEAL MANOMETRY (EM);  Surgeon: Sable Feil, MD;  Location: [facility];  Service: Endoscopy;  Laterality: N/A;   EYE SURGERY  2010   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  Albuterol Sulfate (PROAIR HFA IN) Inhale into the lungs.     Calcium Citrate (CITRACAL PO) Take 2 tablets by mouth daily. 500 mg Calcium & 1000 units of Vitamin D     chlorpheniramine (CHLOR-TRIMETON) 4 MG tablet Take 4 mg by mouth every 6 (six) hours as needed for allergies.     ELIQUIS 5 MG TABS tablet TAKE ONE TABLET BY MOUTH TWICE DAILY 180 tablet 1   esomeprazole (NEXIUM) 20 MG capsule Take 20 mg by mouth daily.      metoprolol succinate (TOPROL-XL) 100 MG 24 hr tablet Take 1 tablet (100 mg total) by mouth daily. 90 tablet 3   pravastatin (PRAVACHOL) 40 MG tablet TAKE 1 TABLET BY MOUTH DAILY. 90 tablet 3   amLODipine (NORVASC) 5 MG tablet Take 1 tablet (5 mg total) by mouth daily. 90 tablet 3   No current facility-administered medications for this visit.    Family History  Problem Relation Age of Onset   Colon cancer  Mother 50   Alcohol abuse Mother    Mental illness Mother    Lung cancer Father        smoker   Alcohol abuse Father    Hypertension Father    Colon cancer Father 28   Breast cancer Maternal Aunt        Age 2   Heart attack Paternal Grandmother    Other Cousin        mother side-vocal cord cancer   Breast cancer Cousin 51    Review of Systems  All other systems reviewed and are negative.  Exam:   BP 120/82    Pulse (!) 58    Ht 5\' 1"  (1.549 m)    Wt 150 lb (68 kg)    SpO2 98%    BMI 28.34 kg/m     General appearance: alert, cooperative and appears stated age Head: normocephalic, without obvious abnormality, atraumatic Lungs: clear to auscultation bilaterally Breasts: normal appearance, no masses or tenderness, No nipple retraction or dimpling, No nipple discharge or bleeding, No axillary adenopathy Heart: regular rate and rhythm Abdomen: soft, non-tender; no masses, no organomegaly Extremities: extremities normal, atraumatic, no cyanosis or edema Neurologic: grossly normal  Pelvic: External genitalia:  beefy red lesion of the right introitus.  Tender to touch.              No abnormal inguinal nodes palpated.              Urethra:  normal appearing urethra with no masses, tenderness or lesions              Bartholins and Skenes: normal                 Unable to tolerate speculum exam.   No pap taken.  Bimanual Exam:  Uterus:  unable to do.   Chaperone was present for exam:  Lovena Le, CMA.   Assessment:   Screening breast exam.   History of urinary tract infections.   Afib.  On Elquis.   Hx lichen planus of mouth.  Lesion of the right introitus suspicious for lichen planus also.  Plan: Mammogram screening discussed. Self breast awareness reviewed.  She may call if she wants the restart the estrogen.   She does not currently intend to use it.  I did discuss the potential effect of estrogen to increase risk of blood clots in the circulation and the potential to  stimulate growth of a breast cancer.  I reviewed D mannose  as a way to reduce risk of urinary tract infection.   I recommend returning for a vulvar biopsy.   She may be a candidate for clobetasol ointment.   After visit summary provided.   30 min  total time was spent for this patient encounter, including preparation, face-to-face counseling with the patient, coordination of care, and documentation of the encounter.

## 2021-10-03 ENCOUNTER — Encounter: Payer: Medicare Other | Admitting: Obstetrics and Gynecology

## 2021-10-03 ENCOUNTER — Ambulatory Visit (INDEPENDENT_AMBULATORY_CARE_PROVIDER_SITE_OTHER): Payer: Medicare Other | Admitting: Obstetrics and Gynecology

## 2021-10-03 ENCOUNTER — Other Ambulatory Visit: Payer: Self-pay

## 2021-10-03 ENCOUNTER — Encounter: Payer: Self-pay | Admitting: Obstetrics and Gynecology

## 2021-10-03 VITALS — BP 120/82 | HR 58 | Ht 61.0 in | Wt 150.0 lb

## 2021-10-03 DIAGNOSIS — Z872 Personal history of diseases of the skin and subcutaneous tissue: Secondary | ICD-10-CM

## 2021-10-03 DIAGNOSIS — N9089 Other specified noninflammatory disorders of vulva and perineum: Secondary | ICD-10-CM | POA: Diagnosis not present

## 2021-10-03 DIAGNOSIS — Z87440 Personal history of urinary (tract) infections: Secondary | ICD-10-CM | POA: Diagnosis not present

## 2021-10-03 DIAGNOSIS — Z1239 Encounter for other screening for malignant neoplasm of breast: Secondary | ICD-10-CM

## 2021-10-03 NOTE — Patient Instructions (Signed)

## 2021-10-04 ENCOUNTER — Other Ambulatory Visit: Payer: Self-pay | Admitting: *Deleted

## 2021-10-04 DIAGNOSIS — N9089 Other specified noninflammatory disorders of vulva and perineum: Secondary | ICD-10-CM

## 2021-10-07 DIAGNOSIS — H268 Other specified cataract: Secondary | ICD-10-CM | POA: Diagnosis not present

## 2021-10-07 DIAGNOSIS — H40023 Open angle with borderline findings, high risk, bilateral: Secondary | ICD-10-CM | POA: Diagnosis not present

## 2021-10-16 ENCOUNTER — Ambulatory Visit: Payer: Medicare Other | Admitting: Obstetrics and Gynecology

## 2021-10-22 ENCOUNTER — Other Ambulatory Visit: Payer: Self-pay | Admitting: Cardiology

## 2021-11-05 ENCOUNTER — Encounter: Payer: Self-pay | Admitting: Cardiology

## 2021-11-05 DIAGNOSIS — R079 Chest pain, unspecified: Secondary | ICD-10-CM

## 2021-11-06 NOTE — Progress Notes (Signed)
PATIENT: Meagan Key DOB: Jan 06, 1946  REASON FOR VISIT: follow up HISTORY FROM: patient  Chief Complaint  Patient presents with   Follow-up    Pt alone, rm 2. Follow up, overall states things are well and no issues or concerns. DME [employer]      HISTORY OF PRESENT ILLNESS:  11/07/21 ALL:  Meagan Key is a 76 y.o. female here today for follow up for OSA on CPAP.  She was last seen by Dr Rexene Alberts for initial CPAP visit in 03/2021. Compliance was excellent, however, she had a leak on the higher side of 34l/min. She is using a nasal pillow. She reports that it fits great if she sleeps on her back. If she is on her side, it leaks. She feels nasal pillow is very comfortable. She is feeling well. Sleeping well. She was diagnosed with Covid 12/21. She stopped using CPAP for about 3 weeks. She has recently resumed therapy and doing well.     HISTORY: (copied from Dr Guadelupe Sabin previous note)   Meagan Key is a 76 year old right-handed woman with an underlying medical history of MGUS, hypertension, hyperlipidemia, asthma, vitamin D deficiency, paroxysmal A. fib, glaucoma, reflux disease, osteopenia, SVT, and mildly overweight state, who presents for follow-up consultation of her obstructive sleep apnea after interim testing and starting AutoPap therapy.  The patient is unaccompanied today.  I first met her at the request of her primary care physician on 10/03/2020, at which time she reported snoring and excessive daytime somnolence.  She had a prior diagnosis of mild obstructive sleep apnea in 2014.  She was advised to proceed with sleep testing.  She had a home sleep test on 10/31/2020 which indicated severe obstructive sleep apnea with an AHI of 39.4/h, O2 nadir 79%.  She was advised to start AutoPap therapy at home.  Her set up date was 01/02/2021.   Today, 04/01/2021: I reviewed her AutoPap compliance data from 02/26/2021 through 03/27/2021, which is a total of 30 days, during which time  she used her machine every night with percent use days greater than 4 hours at 97%, indicating excellent compliance with an average usage of 5 hours and 4 minutes, residual AHI 4/h, on the higher end of the goal, 95th percentile of pressure at 11.7 cm with a range of 7 to 12 cm with EPR, leak on the higher side with the 95th percentile at 34.4 L/min.  She reports doing fairly well, she has adjusted to treatment, feels like it is somewhat helpful, she has been using the nasal cushion interface but has had some trouble getting a replacement every month and also a replacement of her filter.  She eventually received supplies after calling her DME company several times.  She is overall motivated to continue with treatment.  She does sleep about 7 to 8 hours on average and admits to taking off her mask once she has achieved more than 4 hours of usage as she does have some discomfort and air leaks from time to time and dislodging of the mask is cumbersome for her. She feels that the seal is best when she sleeps on her back but she gets tired sleeping on her back.  Per husband's feedback, her snoring has improved, her nocturia is also better.   REVIEW OF SYSTEMS: Out of a complete 14 system review of symptoms, the patient complains only of the following symptoms, none and all other reviewed systems are negative.   ALLERGIES: Allergies  Allergen Reactions  Nitrofurantoin Shortness Of Breath and Other (See Comments)    Itching in throat    Cortisone     A-fib   Lisinopril Itching and Other (See Comments)    SOB and itching is in throat   Symbicort [Budesonide-Formoterol Fumarate]     A-fib    HOME MEDICATIONS: Outpatient Medications Prior to Visit  Medication Sig Dispense Refill   acetaminophen (TYLENOL) 650 MG CR tablet Take 650 mg by mouth every 8 (eight) hours as needed for pain.     Albuterol Sulfate (PROAIR HFA IN) Inhale into the lungs.     alendronate (FOSAMAX) 70 MG tablet 1 tablet 30 minutes  before the first food, beverage or medicine of the day with plain water q weekly     Calcium Citrate (CITRACAL PO) Take 2 tablets by mouth daily. 500 mg Calcium & 1000 units of Vitamin D     chlorpheniramine (CHLOR-TRIMETON) 4 MG tablet Take 4 mg by mouth every 6 (six) hours as needed for allergies.     ELIQUIS 5 MG TABS tablet TAKE ONE TABLET BY MOUTH TWICE DAILY 180 tablet 1   esomeprazole (NEXIUM) 20 MG capsule Take 20 mg by mouth daily.      metoprolol succinate (TOPROL-XL) 100 MG 24 hr tablet TAKE 1 TABLET ONCE DAILY. 90 tablet 1   pravastatin (PRAVACHOL) 40 MG tablet TAKE 1 TABLET BY MOUTH DAILY. 90 tablet 3   amLODipine (NORVASC) 5 MG tablet Take 1 tablet (5 mg total) by mouth daily. 90 tablet 3   No facility-administered medications prior to visit.    PAST MEDICAL HISTORY: Past Medical History:  Diagnosis Date   Allergy    Arthritis    hands   Asthma    rarely uses inhaler   Atrial fibrillation (HCC)    Cataracts, bilateral    Chicken pox    Endometriosis    Esophageal dysfunction    GERD (gastroesophageal reflux disease)    Glaucoma    surg for preventive for NARROW ANGLE   Hematuria    Hx of colonic polyp 06-12-2003   Colonoscopy Dr Amedeo Plenty   Hyperlipemia    Hypertension    Lichen planus    Osteopenia 05/2013   T score -2.3 FRAX 13%/2.7%   Sleep apnea    Borderline.   Did not require sleeping.     SVT (supraventricular tachycardia) (HCC)    Thyroid disease    nodule on thyroid   Tubular adenoma of colon     PAST SURGICAL HISTORY: Past Surgical History:  Procedure Laterality Date   APPENDECTOMY     BREAST CYST EXCISION  AGE 60, 23, 89   bilateral   BREAST EXCISIONAL BIOPSY Bilateral    CATARACT EXTRACTION     CATARACT EXTRACTION     x2   COLONOSCOPY     DILATATION & CURETTAGE/HYSTEROSCOPY WITH TRUECLEAR N/A 02/22/2014   Procedure: DILATATION & CURETTAGE/HYSTEROSCOPY WITH TRUCLEAR;  Surgeon: [name], MD;  Location: [facility];  Service: Gynecology;   Laterality: N/A;   ESOPHAGEAL MANOMETRY N/A 11/29/2012   Procedure: ESOPHAGEAL MANOMETRY (EM);  Surgeon: Sable Feil, MD;  Location: [facility];  Service: Endoscopy;  Laterality: N/A;   EYE SURGERY  2010   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   LAPAROSCOPIC ENDOMETRIOSIS FULGURATION  1972   OOPHORECTOMY  1969   RSO   POLYPECTOMY     THYROIDECTOMY, PARTIAL  AGE 52   TONSILLECTOMY     UPPER GASTROINTESTINAL ENDOSCOPY  2013  FAMILY HISTORY: Family History  Problem Relation Age of Onset   Colon cancer Mother 29   Alcohol abuse Mother    Mental illness Mother    Lung cancer Father        smoker   Alcohol abuse Father    Hypertension Father    Colon cancer Father 7   Breast cancer Maternal Aunt        Age 89   Heart attack Paternal Grandmother    Other Cousin        mother side-vocal cord cancer   Breast cancer Cousin 58    SOCIAL HISTORY: Social History   Socioeconomic History   Marital status: Married    Spouse name: Not on file   Number of children: 2   Years of education: Not on file   Highest education level: Not on file  Occupational History   Occupation: attorney    Employer: [employer]  Tobacco Use   Smoking status: Former    Packs/day: 0.30    Years: 25.00    Pack years: 7.50    Types: Cigarettes    Quit date: 09/14/1984    Years since quitting: 37.1   Smokeless tobacco: Never  Vaping Use   Vaping Use: Never used  Substance and Sexual Activity   Alcohol use: Yes    Comment: once a week   Drug use: No   Sexual activity: Not Currently    Birth control/protection: Post-menopausal    Comment: 1st intercourse 76 yo-Fewer than 5 partners  Other Topics Concern   Not on file  Social History Narrative   MARRIED; 2 CHILDREN   LAW DEGREE; ATTORNEY   Social Determinants of Health   Financial Resource Strain: Not on file  Food Insecurity: Not on file  Transportation Needs: Not on file  Physical Activity: Not on file  Stress:  Not on file  Social Connections: Not on file  Intimate Partner Violence: Not on file     PHYSICAL EXAM  Vitals:   11/07/21 0836  BP: 128/74  Pulse: 61  Weight: 148 lb (67.1 kg)  Height: 5' 2"  (1.575 m)   Body mass index is 27.07 kg/m.  Generalized: Well developed, in no acute distress  Cardiology: normal rate and rhythm, no murmur noted Respiratory: clear to auscultation bilaterally  Neurological examination  Mentation: Alert oriented to time, place, history taking. Follows all commands speech and language fluent Cranial nerve II-XII: Pupils were equal round reactive to light. Extraocular movements were full, visual field were full  Motor: The motor testing reveals 5 over 5 strength of all 4 extremities. Good symmetric motor tone is noted throughout.  Gait and station: Gait is normal.    DIAGNOSTIC DATA (LABS, IMAGING, TESTING) - I reviewed patient records, labs, notes, testing and imaging myself where available.  No flowsheet data found.   Lab Results  Component Value Date   WBC 7.4 09/10/2020   HGB 13.2 09/10/2020   HCT 39.9 09/10/2020   MCV 87.7 09/10/2020   PLT 237 09/10/2020      Component Value Date/Time   NA 141 09/10/2020 0153   K 3.9 09/10/2020 0153   CL 105 09/10/2020 0153   CO2 27 09/10/2020 0153   GLUCOSE 124 (H) 09/10/2020 0153   BUN 28 (H) 09/10/2020 0153   CREATININE 1.02 (H) 09/10/2020 0153   CREATININE 0.87 12/30/2013 0924   CALCIUM 9.8 09/10/2020 0153   PROT 7.0 07/25/2019 1512   ALBUMIN 4.2 07/25/2019 1512   AST  16 07/25/2019 1512   ALT 13 07/25/2019 1512   ALKPHOS 52 07/25/2019 1512   BILITOT 0.3 07/25/2019 1512   GFRNONAA 57 (L) 09/10/2020 0153   GFRAA 56 (L) 06/29/2018 2130   Lab Results  Component Value Date   CHOL 168 01/28/2011   HDL 91.90 01/28/2011   LDLCALC 68 01/28/2011   TRIG 41.0 01/28/2011   CHOLHDL 2 01/28/2011   No results found for: HGBA1C No results found for: VITAMINB12 Lab Results  Component Value Date    TSH 1.257 06/29/2018     ASSESSMENT AND PLAN 76 y.o. year old female  has a past medical history of Allergy, Arthritis, Asthma, Atrial fibrillation ([place]), Cataracts, bilateral, Chicken pox, Endometriosis, Esophageal dysfunction, GERD (gastroesophageal reflux disease), Glaucoma, Hematuria, colonic polyp (06-12-2003), Hyperlipemia, Hypertension, Lichen planus, Osteopenia (05/2013), Sleep apnea, SVT (supraventricular tachycardia) ([place]), Thyroid disease, and Tubular adenoma of colon. here with     ICD-10-CM   1. OSA on CPAP  G47.33 For home use only DME continuous positive airway pressure (CPAP)   Z99.89         TAIMA RADA is doing well on CPAP therapy. Compliance report reveals excellent compliance. She was unable to use CPAP for about three weeks following diagnosis of Covid. She has recently resumed and doing well. She was encouraged to continue using CPAP nightly and for greater than 4 hours each night. She will monitor leak at home and call me for any concerns of worsening. I will recheck download in 6-8 weeks. We will update supply orders as indicated. Risks of untreated sleep apnea review and education materials provided. Healthy lifestyle habits encouraged. She will follow up in 1 year, sooner if needed. She verbalizes understanding and agreement with this plan.    Orders Placed This Encounter  Procedures   For home use only DME continuous positive airway pressure (CPAP)    Supplies    Order Specific Question:   Length of Need    Answer:   Lifetime    Order Specific Question:   Patient has OSA or probable OSA    Answer:   Yes    Order Specific Question:   Is the patient currently using CPAP in the home    Answer:   Yes    Order Specific Question:   Settings    Answer:   Other see comments    Order Specific Question:   CPAP supplies needed    Answer:   Mask, headgear, cushions, filters, heated tubing and water chamber     No orders of the defined types were placed in this  encounter.     Debbora Presto, FNP-C 11/07/2021, 9:03 AM [facility] [address], [place], [place] [address] [phone]

## 2021-11-06 NOTE — Patient Instructions (Addendum)
Please continue using your CPAP regularly. While your insurance requires that you use CPAP at least 4 hours each night on 70% of the nights, I recommend, that you not skip any nights and use it throughout the night if you can. Getting used to CPAP and staying with the treatment long term does take time and patience and discipline. Untreated obstructive sleep apnea when it is moderate to severe can have an adverse impact on cardiovascular health and raise her risk for heart disease, arrhythmias, hypertension, congestive heart failure, stroke and diabetes. Untreated obstructive sleep apnea causes sleep disruption, nonrestorative sleep, and sleep deprivation. This can have an impact on your day to day functioning and cause daytime sleepiness and impairment of cognitive function, memory loss, mood disturbance, and problems focussing. Using CPAP regularly can improve these symptoms.  Continue to leak at home. Let me know if it worsens. I will recheck download in about 8 weeks. If we need to make adjustments I will let you know.   Follow up in 1 year

## 2021-11-07 ENCOUNTER — Encounter: Payer: Self-pay | Admitting: Family Medicine

## 2021-11-07 ENCOUNTER — Ambulatory Visit (INDEPENDENT_AMBULATORY_CARE_PROVIDER_SITE_OTHER): Payer: Medicare Other | Admitting: Family Medicine

## 2021-11-07 VITALS — BP 128/74 | HR 61 | Ht 62.0 in | Wt 148.0 lb

## 2021-11-07 DIAGNOSIS — G4733 Obstructive sleep apnea (adult) (pediatric): Secondary | ICD-10-CM | POA: Diagnosis not present

## 2021-11-07 DIAGNOSIS — Z9989 Dependence on other enabling machines and devices: Secondary | ICD-10-CM

## 2021-11-07 NOTE — Progress Notes (Signed)
CM sent to [facility]

## 2021-11-13 ENCOUNTER — Telehealth (HOSPITAL_COMMUNITY): Payer: Self-pay | Admitting: *Deleted

## 2021-11-13 NOTE — Telephone Encounter (Signed)
Close encounter 

## 2021-11-15 ENCOUNTER — Ambulatory Visit (HOSPITAL_COMMUNITY)
Admission: RE | Admit: 2021-11-15 | Discharge: 2021-11-15 | Disposition: A | Discharge status: Home / Self Care | Payer: Medicare Other | Source: Ambulatory Visit | Attending: Cardiology | Admitting: Cardiology

## 2021-11-15 ENCOUNTER — Other Ambulatory Visit: Payer: Self-pay

## 2021-11-15 DIAGNOSIS — R079 Chest pain, unspecified: Secondary | ICD-10-CM | POA: Diagnosis not present

## 2021-11-15 LAB — EXERCISE TOLERANCE TEST
Angina Index: 0
Duke Treadmill Score: -4
Estimated workload: 7.5
Exercise duration (min): 6 min
Exercise duration (sec): 23 s
MPHR: 143 {beats}/min
Peak HR: 144 {beats}/min
Percent HR: 100 %
Rest HR: 67 {beats}/min
ST Depression (mm): 2 mm

## 2021-11-21 ENCOUNTER — Ambulatory Visit (INDEPENDENT_AMBULATORY_CARE_PROVIDER_SITE_OTHER): Payer: Medicare Other | Admitting: Cardiovascular Disease

## 2021-11-21 ENCOUNTER — Other Ambulatory Visit: Payer: Self-pay

## 2021-11-21 ENCOUNTER — Encounter: Payer: Self-pay | Admitting: Cardiovascular Disease

## 2021-11-21 VITALS — BP 136/80 | HR 64 | Ht 61.5 in | Wt 149.0 lb

## 2021-11-21 DIAGNOSIS — I208 Other forms of angina pectoris: Secondary | ICD-10-CM | POA: Diagnosis not present

## 2021-11-21 DIAGNOSIS — I480 Paroxysmal atrial fibrillation: Secondary | ICD-10-CM

## 2021-11-21 DIAGNOSIS — E7800 Pure hypercholesterolemia, unspecified: Secondary | ICD-10-CM

## 2021-11-21 DIAGNOSIS — D6869 Other thrombophilia: Secondary | ICD-10-CM

## 2021-11-21 DIAGNOSIS — I10 Essential (primary) hypertension: Secondary | ICD-10-CM

## 2021-11-21 DIAGNOSIS — I471 Supraventricular tachycardia: Secondary | ICD-10-CM | POA: Diagnosis not present

## 2021-11-21 LAB — BASIC METABOLIC PANEL
BUN/Creatinine Ratio: 25 (ref 12–28)
BUN: 23 mg/dL (ref 8–27)
CO2: 27 mmol/L (ref 20–29)
Calcium: 9.3 mg/dL (ref 8.7–10.3)
Chloride: 102 mmol/L (ref 96–106)
Creatinine, Ser: 0.93 mg/dL (ref 0.57–1.00)
Glucose: 70 mg/dL (ref 70–99)
Potassium: 4.8 mmol/L (ref 3.5–5.2)
Sodium: 139 mmol/L (ref 134–144)
eGFR: 63 mL/min/{1.73_m2} (ref >59)

## 2021-11-21 LAB — CBC
Hematocrit: 36.8 % (ref 34.0–46.6)
Hemoglobin: 12.3 g/dL (ref 11.1–15.9)
MCH: 29.1 pg (ref 26.6–33.0)
MCHC: 33.4 g/dL (ref 31.5–35.7)
MCV: 87 fL (ref 79–97)
Platelets: 189 10*3/uL (ref 150–450)
RBC: 4.23 x10E6/uL (ref 3.77–5.28)
RDW: 13.4 % (ref 11.7–15.4)
WBC: 5 10*3/uL (ref 3.4–10.8)

## 2021-11-21 NOTE — Patient Instructions (Signed)
Medication Instructions:  No changes *If you need a refill on your cardiac medications before your next appointment, please call your pharmacy*  Testing/Procedures: Your physician has requested that you have a cardiac catheterization. Cardiac catheterization is used to diagnose and/or treat various heart conditions. Doctors may recommend this procedure for a number of different reasons. The most common reason is to evaluate chest pain. Chest pain can be a symptom of coronary artery disease (CAD), and cardiac catheterization can show whether plaque is narrowing or blocking your hearts arteries. This procedure is also used to evaluate the valves, as well as measure the blood flow and oxygen levels in different parts of your heart. For further information please visit [email or web address]. Please follow instruction sheet, as given.   Follow-Up: At [facility], you and your health needs are our priority.  As part of our continuing mission to provide you with exceptional heart care, we have created designated Provider Care Teams.  These Care Teams include your primary Cardiologist (physician) and Advanced Practice Providers (APPs -  Physician Assistants and Nurse Practitioners) who all work together to provide you with the care you need, when you need it.  We recommend signing up for the patient portal called "MyChart".  Sign up information is provided on this After Visit Summary.  MyChart is used to connect with patients for Virtual Visits (Telemedicine).  Patients are able to view lab/test results, encounter notes, upcoming appointments, etc.  Non-urgent messages can be sent to your provider as well.   To learn more about what you can do with MyChart, go to [email or web address].    Your next appointment:   Follow up in 3-4 weeks with Dr. Percival Spanish  Other Instructions  [facility] CARDIOVASCULAR DIVISION [facility] [place]  [place] [address] Dept: [phone] Loc: [phone]  Meagan Key  11/21/2021  You are scheduled for a Cardiac Catheterization on Tuesday, February 7 with Dr. Shelva Majestic.  1. Please arrive at the [facility] (Main Entrance A) at [facility]: [address] [place], [place] [address] at 5:30 AM (This time is two hours before your procedure to ensure your preparation). Free valet parking service is available.   Special note: Every effort is made to have your procedure done on time. Please understand that emergencies sometimes delay scheduled procedures.  2. Diet: Do not eat solid foods after midnight.  The patient may have clear liquids until 5am upon the day of the procedure.  3. Labs: You will need to have blood drawn on 11/21/21  4. Medication instructions in preparation for your procedure: Hold the Eliquis two days prior and the day of the procedure (2/5, 2/6, 2/7)  On the morning of your procedure, take your 81 mg Aspirin and any morning medicines NOT listed above.  You may use sips of water.  5. Plan for one night stay--bring personal belongings. 6. Bring a current list of your medications and current insurance cards. 7. You MUST have a responsible person to drive you home. 8. Someone MUST be with you the first 24 hours after you arrive home or your discharge will be delayed. 9. Please wear clothes that are easy to get on and off and wear slip-on shoes.  Thank you for allowing [place] to care for you!   -- [facility]

## 2021-11-22 ENCOUNTER — Encounter: Payer: Self-pay | Admitting: Cardiovascular Disease

## 2021-11-22 ENCOUNTER — Ambulatory Visit: Payer: Medicare Other | Admitting: Obstetrics and Gynecology

## 2021-11-22 NOTE — H&P (View-Only) (Signed)
Cardiology Office Note:    Date:  11/22/2021   ID:  Meagan Key, DOB Sep 26, 1946, MRN [id number]  PCP:  [name], MD   [facility] Providers Cardiologist:  Minus Breeding, MD     Referring MD: [name], MD   Chief Complaint  Patient presents with   Abnormal stress test    History of Present Illness:    Meagan Key is a 76 y.o. female with a hx of HTN, OSA, HLP, remote SVT, paroxysmal atrial fibrillation on chronic anticoagulation with Eliquis who has recently developed exertional chest discomfort.  She has been having dyspnea on exertion and fatigue as well as retrosternal chest tightness for the last month or so.  Symptoms became most obvious after she had a otherwise mild COVID-19 infection in December.  She initially complained of this discomfort when she had an episode of tachycardia up to 177 bpm on Sunday, January 15, while she was walking on her treadmill.  Otherwise, the chest tightness only happens when she "hurries".  She notices it when she "walks with a purpose" not during a casual stroll.  She has not had any chest discomfort at rest.   She therefore came in for a treadmill stress test on 11/15/2021 which was an abnormal study with 2 mm downsloping ST segment depression in multiple leads, despite normal blood pressure and heart rate response and no evidence of atrial fibrillation.  She only exercised for 6 minutes and 23 seconds.  In 2019 on a similar Bruce protocol treadmill stress that she was able to exercise for over 8 minutes without overt ST segment changes.  She has an excellent HDL cholesterol although she does have borderline elevated total and LDL cholesterol.  She is taking pravastatin.  She reports a history of intolerance to Lipitor.  She has never smoked.  She does not have diabetes mellitus.  She also has a history of GERD and has previously undergone manometry for suspected esophageal spasm.  Past Medical History:  Diagnosis Date   Allergy     Arthritis    hands   Asthma    rarely uses inhaler   Atrial fibrillation (HCC)    Cataracts, bilateral    Chicken pox    Endometriosis    Esophageal dysfunction    GERD (gastroesophageal reflux disease)    Glaucoma    surg for preventive for NARROW ANGLE   Hematuria    Hx of colonic polyp 06-12-2003   Colonoscopy Dr Amedeo Plenty   Hyperlipemia    Hypertension    Lichen planus    Osteopenia 05/2013   T score -2.3 FRAX 13%/2.7%   Sleep apnea    Borderline.   Did not require sleeping.     SVT (supraventricular tachycardia) (HCC)    Thyroid disease    nodule on thyroid   Tubular adenoma of colon     Past Surgical History:  Procedure Laterality Date   APPENDECTOMY     BREAST CYST EXCISION  AGE 32, 23, 56   bilateral   BREAST EXCISIONAL BIOPSY Bilateral    CATARACT EXTRACTION     CATARACT EXTRACTION     x2   COLONOSCOPY     DILATATION & CURETTAGE/HYSTEROSCOPY WITH TRUECLEAR N/A 02/22/2014   Procedure: DILATATION & CURETTAGE/HYSTEROSCOPY WITH TRUCLEAR;  Surgeon: [name], MD;  Location: [facility];  Service: Gynecology;  Laterality: N/A;   ESOPHAGEAL MANOMETRY N/A 11/29/2012   Procedure: ESOPHAGEAL MANOMETRY (EM);  Surgeon: Sable Feil, MD;  Location: [facility];  Service: Endoscopy;  Laterality: N/A;   EYE SURGERY  2010   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   LAPAROSCOPIC ENDOMETRIOSIS FULGURATION  1972   OOPHORECTOMY  1969   RSO   POLYPECTOMY     THYROIDECTOMY, PARTIAL  AGE 61   TONSILLECTOMY     UPPER GASTROINTESTINAL ENDOSCOPY  2013    Current Medications: Current Meds  Medication Sig   albuterol (VENTOLIN HFA) 108 (90 Base) MCG/ACT inhaler Inhale 2 puffs into the lungs every 6 (six) hours as needed for wheezing or shortness of breath.   alendronate (FOSAMAX) 70 MG tablet Take 70 mg by mouth every Wednesday.   amLODipine (NORVASC) 5 MG tablet Take 1 tablet (5 mg total) by mouth daily.   Calcium Citrate (CITRACAL PO) Take 2 tablets by mouth daily.  500 mg Calcium & 1000 units of Vitamin D   chlorpheniramine (CHLOR-TRIMETON) 4 MG tablet Take 4 mg by mouth every 6 (six) hours as needed for allergies.   ELIQUIS 5 MG TABS tablet TAKE ONE TABLET BY MOUTH TWICE DAILY   esomeprazole (NEXIUM) 20 MG capsule Take 20 mg by mouth daily.    metoprolol succinate (TOPROL-XL) 100 MG 24 hr tablet TAKE 1 TABLET ONCE DAILY.   pravastatin (PRAVACHOL) 40 MG tablet TAKE 1 TABLET BY MOUTH DAILY.   [DISCONTINUED] acetaminophen (TYLENOL) 650 MG CR tablet Take 650 mg by mouth every 8 (eight) hours as needed for pain.     Allergies:   Nitrofurantoin, Cortisone, Lisinopril, and Symbicort [budesonide-formoterol fumarate]   Social History   Socioeconomic History   Marital status: Married    Spouse name: Not on file   Number of children: 2   Years of education: Not on file   Highest education level: Not on file  Occupational History   Occupation: attorney    Employer: [employer]  Tobacco Use   Smoking status: Former    Packs/day: 0.30    Years: 25.00    Pack years: 7.50    Types: Cigarettes    Quit date: 09/14/1984    Years since quitting: 37.2   Smokeless tobacco: Never  Vaping Use   Vaping Use: Never used  Substance and Sexual Activity   Alcohol use: Yes    Comment: once a week   Drug use: No   Sexual activity: Not Currently    Birth control/protection: Post-menopausal    Comment: 1st intercourse 76 yo-Fewer than 5 partners  Other Topics Concern   Not on file  Social History Narrative   MARRIED; 2 CHILDREN   LAW DEGREE; ATTORNEY   Social Determinants of Health   Financial Resource Strain: Not on file  Food Insecurity: Not on file  Transportation Needs: Not on file  Physical Activity: Not on file  Stress: Not on file  Social Connections: Not on file     Family History: The patient's family history includes Alcohol abuse in her father and mother; Breast cancer in her maternal aunt; Breast cancer (age of onset: 58) in her  cousin; Colon cancer (age of onset: 39) in her mother; Colon cancer (age of onset: 86) in her father; Heart attack in her paternal grandmother; Hypertension in her father; Lung cancer in her father; Mental illness in her mother; Other in her cousin.  ROS:   Please see the history of present illness.     All other systems reviewed and are negative.  EKGs/Labs/Other Studies Reviewed:    The following studies were reviewed today: Treadmill stress test 11/15/2021 (  abnormal)  Echocardiogram 07/23/2018 - Left ventricle: The cavity size was normal. Systolic function was    normal. The estimated ejection fraction was in the range of 60%    to 65%. Wall motion was normal; there were no regional wall    motion abnormalities. Features are consistent with a pseudonormal    left ventricular filling pattern, with concomitant abnormal    relaxation and increased filling pressure (grade 2 diastolic    dysfunction).  - Mitral valve: Calcified annulus. There was mild regurgitation.  - Atrial septum: A patent foramen ovale cannot be excluded.  - Tricuspid valve: There was mild regurgitation.  - Pulmonary arteries: PA peak pressure: 36 mm Hg (S).   Impressions:   - The right ventricular systolic pressure was increased consistent    with mild pulmonary hypertension.   Recommendations:  recommend agitated saline contrast injection to  rule out PFO.    EKG:  EKG is  ordered today.  The ekg ordered today demonstrates normal sinus rhythm, nonspecific T wave changes, normal QTC 394 ms  Recent Labs: 11/21/2021: BUN 23; Creatinine, Ser 0.93; Hemoglobin 12.3; Platelets 189; Potassium 4.8; Sodium 139  Recent Lipid Panel    Component Value Date/Time   CHOL 168 01/28/2011 0855   TRIG 41.0 01/28/2011 0855   HDL 91.90 01/28/2011 0855   CHOLHDL 2 01/28/2011 0855   VLDL 8.2 01/28/2011 0855   LDLCALC 68 01/28/2011 0855     Risk Assessment/Calculations:    CHA2DS2-VASc Score = 4   This indicates a 4.8%  annual risk of stroke. The patient's score is based upon: CHF History: 0 HTN History: 1 Diabetes History: 0 Stroke History: 0 Vascular Disease History: 0 Age Score: 2 Gender Score: 1          Physical Exam:    VS:  BP 136/80    Pulse 64    Ht 5' 1.5" (1.562 m)    Wt 149 lb (67.6 kg)    SpO2 97%    BMI 27.70 kg/m     Wt Readings from Last 3 Encounters:  11/21/21 149 lb (67.6 kg)  11/07/21 148 lb (67.1 kg)  10/03/21 150 lb (68 kg)     GEN: Appears fit, younger than stated age well nourished, well developed in no acute distress HEENT: Normal NECK: No JVD; No carotid bruits LYMPHATICS: No lymphadenopathy CARDIAC: RRR, no murmurs, rubs, gallops RESPIRATORY:  Clear to auscultation without rales, wheezing or rhonchi  ABDOMEN: Soft, non-tender, non-distended MUSCULOSKELETAL:  No edema; No deformity  SKIN: Warm and dry NEUROLOGIC:  Alert and oriented x 3 PSYCHIATRIC:  Normal affect   ASSESSMENT:    1. Stable angina ([place])   2. PAF (paroxysmal atrial fibrillation) ([place])   3. Acquired thrombophilia ([place])   4. Hypercholesterolemia   5. SVT (supraventricular tachycardia) ([place])   6. Essential hypertension    PLAN:    In order of problems listed above:  Exertional chest pain/abnormal stress test: Discussed the pros and cons of either a conservative approach with a coronary CT angiogram versus a cardiac catheterization (the latter would be a more invasive approach with a more definitive diagnosis and option for immediate treatment.  She wants to be sure to complete her evaluation by the end of February because she has travel plans.  She prefers to go straight to coronary angiography.  Her husband had bypass surgery couple of years ago and his presurgical cardiac catheterization was performed by Dr. Ellouise Newer, so we will schedule with him. Paroxysmal  atrial fibrillation: She had marked tachycardia during her first episode of chest pain earlier this month, probably during an episode  of A. fib with RVR.  Otherwise, her symptoms of atrial fibrillation are fairly mild.  She is on appropriate anticoagulation with Eliquis.   Anticoagulation: Has not had any bleeding issues.We will hold the Eliquis prior to her cardiac catheterization.   HLP: She has a mildly elevated LDL at 109.  If significant CAD is not identified there is no compelling reason to push for more aggressive lipid-lowering therapy, but if she has CAD she might need PCSK9 inhibitors, based on her previous side effects to more potent statins. SVT: Has not been a recent issue.  On metoprolol. HTN: Also on amlodipine, controlled.      Shared Decision Making/Informed Consent The risks [stroke (1 in 1000), death (1 in 1000), kidney failure [usually temporary] (1 in 500), bleeding (1 in 200), allergic reaction [possibly serious] (1 in 200)], benefits (diagnostic support and management of coronary artery disease) and alternatives of a cardiac catheterization were discussed in detail with Meagan Key and she is willing to proceed.    Medication Adjustments/Labs and Tests Ordered: Current medicines are reviewed at length with the patient today.  Concerns regarding medicines are outlined above.  Orders Placed This Encounter  Procedures   Basic metabolic panel   CBC   EKG 12-Lead   No orders of the defined types were placed in this encounter.   Patient Instructions  Medication Instructions:  No changes *If you need a refill on your cardiac medications before your next appointment, please call your pharmacy*  Testing/Procedures: Your physician has requested that you have a cardiac catheterization. Cardiac catheterization is used to diagnose and/or treat various heart conditions. Doctors may recommend this procedure for a number of different reasons. The most common reason is to evaluate chest pain. Chest pain can be a symptom of coronary artery disease (CAD), and cardiac catheterization can show whether plaque is  narrowing or blocking your hearts arteries. This procedure is also used to evaluate the valves, as well as measure the blood flow and oxygen levels in different parts of your heart. For further information please visit [email or web address]. Please follow instruction sheet, as given.   Follow-Up: At [facility], you and your health needs are our priority.  As part of our continuing mission to provide you with exceptional heart care, we have created designated Provider Care Teams.  These Care Teams include your primary Cardiologist (physician) and Advanced Practice Providers (APPs -  Physician Assistants and Nurse Practitioners) who all work together to provide you with the care you need, when you need it.  We recommend signing up for the patient portal called "MyChart".  Sign up information is provided on this After Visit Summary.  MyChart is used to connect with patients for Virtual Visits (Telemedicine).  Patients are able to view lab/test results, encounter notes, upcoming appointments, etc.  Non-urgent messages can be sent to your provider as well.   To learn more about what you can do with MyChart, go to [email or web address].    Your next appointment:   Follow up in 3-4 weeks with Dr. Percival Spanish  Other Instructions  [facility] CARDIOVASCULAR DIVISION [facility] [place] [address] Dept: [phone] Loc: [phone]  OTIS BURRESS  11/21/2021  You are scheduled for a Cardiac Catheterization on Tuesday, February 7 with Dr. Shelva Majestic.  1. Please arrive at the [facility] (Main  Entrance A) at [facility]: [place], [place] [address] at 5:30 AM (This time is two hours before your procedure to ensure your preparation). Free valet parking service is available.   Special note: Every effort is made to have your procedure done on time. Please understand that emergencies sometimes delay scheduled  procedures.  2. Diet: Do not eat solid foods after midnight.  The patient may have clear liquids until 5am upon the day of the procedure.  3. Labs: You will need to have blood drawn on 11/21/21  4. Medication instructions in preparation for your procedure: Hold the Eliquis two days prior and the day of the procedure (2/5, 2/6, 2/7)  On the morning of your procedure, take your 81 mg Aspirin and any morning medicines NOT listed above.  You may use sips of water.  5. Plan for one night stay--bring personal belongings. 6. Bring a current list of your medications and current insurance cards. 7. You MUST have a responsible person to drive you home. 8. Someone MUST be with you the first 24 hours after you arrive home or your discharge will be delayed. 9. Please wear clothes that are easy to get on and off and wear slip-on shoes.  Thank you for allowing [place] to care for you!   -- [facility]     Signed, Sanda Klein, MD  11/22/2021 12:42 PM    [place]

## 2021-11-22 NOTE — Progress Notes (Signed)
Cardiology Office Note:    Date:  11/22/2021   ID:  Meagan Key, DOB 07/04/1946, MRN [id number]  PCP:  [name], MD   [facility] Providers Cardiologist:  Minus Breeding, MD     Referring MD: [name], MD   Chief Complaint  Patient presents with   Abnormal stress test    History of Present Illness:    Meagan Key is a 76 y.o. female with a hx of HTN, OSA, HLP, remote SVT, paroxysmal atrial fibrillation on chronic anticoagulation with Eliquis who has recently developed exertional chest discomfort.  She has been having dyspnea on exertion and fatigue as well as retrosternal chest tightness for the last month or so.  Symptoms became most obvious after she had a otherwise mild COVID-19 infection in December.  She initially complained of this discomfort when she had an episode of tachycardia up to 177 bpm on Sunday, January 15, while she was walking on her treadmill.  Otherwise, the chest tightness only happens when she "hurries".  She notices it when she "walks with a purpose" not during a casual stroll.  She has not had any chest discomfort at rest.   She therefore came in for a treadmill stress test on 11/15/2021 which was an abnormal study with 2 mm downsloping ST segment depression in multiple leads, despite normal blood pressure and heart rate response and no evidence of atrial fibrillation.  She only exercised for 6 minutes and 23 seconds.  In 2019 on a similar Bruce protocol treadmill stress that she was able to exercise for over 8 minutes without overt ST segment changes.  She has an excellent HDL cholesterol although she does have borderline elevated total and LDL cholesterol.  She is taking pravastatin.  She reports a history of intolerance to Lipitor.  She has never smoked.  She does not have diabetes mellitus.  She also has a history of GERD and has previously undergone manometry for suspected esophageal spasm.  Past Medical History:  Diagnosis Date   Allergy     Arthritis    hands   Asthma    rarely uses inhaler   Atrial fibrillation (HCC)    Cataracts, bilateral    Chicken pox    Endometriosis    Esophageal dysfunction    GERD (gastroesophageal reflux disease)    Glaucoma    surg for preventive for NARROW ANGLE   Hematuria    Hx of colonic polyp 06-12-2003   Colonoscopy Dr Amedeo Plenty   Hyperlipemia    Hypertension    Lichen planus    Osteopenia 05/2013   T score -2.3 FRAX 13%/2.7%   Sleep apnea    Borderline.   Did not require sleeping.     SVT (supraventricular tachycardia) (HCC)    Thyroid disease    nodule on thyroid   Tubular adenoma of colon     Past Surgical History:  Procedure Laterality Date   APPENDECTOMY     BREAST CYST EXCISION  AGE 30, 23, 56   bilateral   BREAST EXCISIONAL BIOPSY Bilateral    CATARACT EXTRACTION     CATARACT EXTRACTION     x2   COLONOSCOPY     DILATATION & CURETTAGE/HYSTEROSCOPY WITH TRUECLEAR N/A 02/22/2014   Procedure: DILATATION & CURETTAGE/HYSTEROSCOPY WITH TRUCLEAR;  Surgeon: [name], MD;  Location: [facility];  Service: Gynecology;  Laterality: N/A;   ESOPHAGEAL MANOMETRY N/A 11/29/2012   Procedure: ESOPHAGEAL MANOMETRY (EM);  Surgeon: Sable Feil, MD;  Location: [facility];  Service: Endoscopy;  Laterality: N/A;   EYE SURGERY  2010   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   LAPAROSCOPIC ENDOMETRIOSIS FULGURATION  1972   OOPHORECTOMY  1969   RSO   POLYPECTOMY     THYROIDECTOMY, PARTIAL  AGE 77   TONSILLECTOMY     UPPER GASTROINTESTINAL ENDOSCOPY  2013    Current Medications: Current Meds  Medication Sig   albuterol (VENTOLIN HFA) 108 (90 Base) MCG/ACT inhaler Inhale 2 puffs into the lungs every 6 (six) hours as needed for wheezing or shortness of breath.   alendronate (FOSAMAX) 70 MG tablet Take 70 mg by mouth every Wednesday.   amLODipine (NORVASC) 5 MG tablet Take 1 tablet (5 mg total) by mouth daily.   Calcium Citrate (CITRACAL PO) Take 2 tablets by mouth daily.  500 mg Calcium & 1000 units of Vitamin D   chlorpheniramine (CHLOR-TRIMETON) 4 MG tablet Take 4 mg by mouth every 6 (six) hours as needed for allergies.   ELIQUIS 5 MG TABS tablet TAKE ONE TABLET BY MOUTH TWICE DAILY   esomeprazole (NEXIUM) 20 MG capsule Take 20 mg by mouth daily.    metoprolol succinate (TOPROL-XL) 100 MG 24 hr tablet TAKE 1 TABLET ONCE DAILY.   pravastatin (PRAVACHOL) 40 MG tablet TAKE 1 TABLET BY MOUTH DAILY.   [DISCONTINUED] acetaminophen (TYLENOL) 650 MG CR tablet Take 650 mg by mouth every 8 (eight) hours as needed for pain.     Allergies:   Nitrofurantoin, Cortisone, Lisinopril, and Symbicort [budesonide-formoterol fumarate]   Social History   Socioeconomic History   Marital status: Married    Spouse name: Not on file   Number of children: 2   Years of education: Not on file   Highest education level: Not on file  Occupational History   Occupation: attorney    Employer: [employer]  Tobacco Use   Smoking status: Former    Packs/day: 0.30    Years: 25.00    Pack years: 7.50    Types: Cigarettes    Quit date: 09/14/1984    Years since quitting: 37.2   Smokeless tobacco: Never  Vaping Use   Vaping Use: Never used  Substance and Sexual Activity   Alcohol use: Yes    Comment: once a week   Drug use: No   Sexual activity: Not Currently    Birth control/protection: Post-menopausal    Comment: 1st intercourse 76 yo-Fewer than 5 partners  Other Topics Concern   Not on file  Social History Narrative   MARRIED; 2 CHILDREN   LAW DEGREE; ATTORNEY   Social Determinants of Health   Financial Resource Strain: Not on file  Food Insecurity: Not on file  Transportation Needs: Not on file  Physical Activity: Not on file  Stress: Not on file  Social Connections: Not on file     Family History: The patient's family history includes Alcohol abuse in her father and mother; Breast cancer in her maternal aunt; Breast cancer (age of onset: 82) in her  cousin; Colon cancer (age of onset: 34) in her mother; Colon cancer (age of onset: 57) in her father; Heart attack in her paternal grandmother; Hypertension in her father; Lung cancer in her father; Mental illness in her mother; Other in her cousin.  ROS:   Please see the history of present illness.     All other systems reviewed and are negative.  EKGs/Labs/Other Studies Reviewed:    The following studies were reviewed today: Treadmill stress test 11/15/2021 (  abnormal)  Echocardiogram 07/23/2018 - Left ventricle: The cavity size was normal. Systolic function was    normal. The estimated ejection fraction was in the range of 60%    to 65%. Wall motion was normal; there were no regional wall    motion abnormalities. Features are consistent with a pseudonormal    left ventricular filling pattern, with concomitant abnormal    relaxation and increased filling pressure (grade 2 diastolic    dysfunction).  - Mitral valve: Calcified annulus. There was mild regurgitation.  - Atrial septum: A patent foramen ovale cannot be excluded.  - Tricuspid valve: There was mild regurgitation.  - Pulmonary arteries: PA peak pressure: 36 mm Hg (S).   Impressions:   - The right ventricular systolic pressure was increased consistent    with mild pulmonary hypertension.   Recommendations:  recommend agitated saline contrast injection to  rule out PFO.    EKG:  EKG is  ordered today.  The ekg ordered today demonstrates normal sinus rhythm, nonspecific T wave changes, normal QTC 394 ms  Recent Labs: 11/21/2021: BUN 23; Creatinine, Ser 0.93; Hemoglobin 12.3; Platelets 189; Potassium 4.8; Sodium 139  Recent Lipid Panel    Component Value Date/Time   CHOL 168 01/28/2011 0855   TRIG 41.0 01/28/2011 0855   HDL 91.90 01/28/2011 0855   CHOLHDL 2 01/28/2011 0855   VLDL 8.2 01/28/2011 0855   LDLCALC 68 01/28/2011 0855     Risk Assessment/Calculations:    CHA2DS2-VASc Score = 4   This indicates a 4.8%  annual risk of stroke. The patient's score is based upon: CHF History: 0 HTN History: 1 Diabetes History: 0 Stroke History: 0 Vascular Disease History: 0 Age Score: 2 Gender Score: 1          Physical Exam:    VS:  BP 136/80    Pulse 64    Ht 5' 1.5" (1.562 m)    Wt 149 lb (67.6 kg)    SpO2 97%    BMI 27.70 kg/m     Wt Readings from Last 3 Encounters:  11/21/21 149 lb (67.6 kg)  11/07/21 148 lb (67.1 kg)  10/03/21 150 lb (68 kg)     GEN: Appears fit, younger than stated age well nourished, well developed in no acute distress HEENT: Normal NECK: No JVD; No carotid bruits LYMPHATICS: No lymphadenopathy CARDIAC: RRR, no murmurs, rubs, gallops RESPIRATORY:  Clear to auscultation without rales, wheezing or rhonchi  ABDOMEN: Soft, non-tender, non-distended MUSCULOSKELETAL:  No edema; No deformity  SKIN: Warm and dry NEUROLOGIC:  Alert and oriented x 3 PSYCHIATRIC:  Normal affect   ASSESSMENT:    1. Stable angina ([place])   2. PAF (paroxysmal atrial fibrillation) ([place])   3. Acquired thrombophilia ([place])   4. Hypercholesterolemia   5. SVT (supraventricular tachycardia) ([place])   6. Essential hypertension    PLAN:    In order of problems listed above:  Exertional chest pain/abnormal stress test: Discussed the pros and cons of either a conservative approach with a coronary CT angiogram versus a cardiac catheterization (the latter would be a more invasive approach with a more definitive diagnosis and option for immediate treatment.  She wants to be sure to complete her evaluation by the end of February because she has travel plans.  She prefers to go straight to coronary angiography.  Her husband had bypass surgery couple of years ago and his presurgical cardiac catheterization was performed by Dr. Ellouise Newer, so we will schedule with him. Paroxysmal  atrial fibrillation: She had marked tachycardia during her first episode of chest pain earlier this month, probably during an episode  of A. fib with RVR.  Otherwise, her symptoms of atrial fibrillation are fairly mild.  She is on appropriate anticoagulation with Eliquis.   Anticoagulation: Has not had any bleeding issues.We will hold the Eliquis prior to her cardiac catheterization.   HLP: She has a mildly elevated LDL at 109.  If significant CAD is not identified there is no compelling reason to push for more aggressive lipid-lowering therapy, but if she has CAD she might need PCSK9 inhibitors, based on her previous side effects to more potent statins. SVT: Has not been a recent issue.  On metoprolol. HTN: Also on amlodipine, controlled.      Shared Decision Making/Informed Consent The risks [stroke (1 in 1000), death (1 in 1000), kidney failure [usually temporary] (1 in 500), bleeding (1 in 200), allergic reaction [possibly serious] (1 in 200)], benefits (diagnostic support and management of coronary artery disease) and alternatives of a cardiac catheterization were discussed in detail with Ms. Hamler and she is willing to proceed.    Medication Adjustments/Labs and Tests Ordered: Current medicines are reviewed at length with the patient today.  Concerns regarding medicines are outlined above.  Orders Placed This Encounter  Procedures   Basic metabolic panel   CBC   EKG 12-Lead   No orders of the defined types were placed in this encounter.   Patient Instructions  Medication Instructions:  No changes *If you need a refill on your cardiac medications before your next appointment, please call your pharmacy*  Testing/Procedures: Your physician has requested that you have a cardiac catheterization. Cardiac catheterization is used to diagnose and/or treat various heart conditions. Doctors may recommend this procedure for a number of different reasons. The most common reason is to evaluate chest pain. Chest pain can be a symptom of coronary artery disease (CAD), and cardiac catheterization can show whether plaque is  narrowing or blocking your hearts arteries. This procedure is also used to evaluate the valves, as well as measure the blood flow and oxygen levels in different parts of your heart. For further information please visit [email or web address]. Please follow instruction sheet, as given.   Follow-Up: At [facility], you and your health needs are our priority.  As part of our continuing mission to provide you with exceptional heart care, we have created designated Provider Care Teams.  These Care Teams include your primary Cardiologist (physician) and Advanced Practice Providers (APPs -  Physician Assistants and Nurse Practitioners) who all work together to provide you with the care you need, when you need it.  We recommend signing up for the patient portal called "MyChart".  Sign up information is provided on this After Visit Summary.  MyChart is used to connect with patients for Virtual Visits (Telemedicine).  Patients are able to view lab/test results, encounter notes, upcoming appointments, etc.  Non-urgent messages can be sent to your provider as well.   To learn more about what you can do with MyChart, go to [email or web address].    Your next appointment:   Follow up in 3-4 weeks with Dr. Percival Spanish  Other Instructions   [facility] CARDIOVASCULAR DIVISION [place] [address] Dept: [phone] Loc: [phone]  Meagan Key  11/21/2021  You are scheduled for a Cardiac Catheterization on Tuesday, February 7 with Dr. Shelva Majestic.  1. Please arrive at the [facility] (Main  Entrance A) at [facility]: [place], [place] [address] at 5:30 AM (This time is two hours before your procedure to ensure your preparation). Free valet parking service is available.   Special note: Every effort is made to have your procedure done on time. Please understand that emergencies sometimes delay scheduled  procedures.  2. Diet: Do not eat solid foods after midnight.  The patient may have clear liquids until 5am upon the day of the procedure.  3. Labs: You will need to have blood drawn on 11/21/21  4. Medication instructions in preparation for your procedure: Hold the Eliquis two days prior and the day of the procedure (2/5, 2/6, 2/7)  On the morning of your procedure, take your 81 mg Aspirin and any morning medicines NOT listed above.  You may use sips of water.  5. Plan for one night stay--bring personal belongings. 6. Bring a current list of your medications and current insurance cards. 7. You MUST have a responsible person to drive you home. 8. Someone MUST be with you the first 24 hours after you arrive home or your discharge will be delayed. 9. Please wear clothes that are easy to get on and off and wear slip-on shoes.  Thank you for allowing [place] to care for you!   -- [facility]     Signed, Sanda Klein, MD  11/22/2021 12:42 PM    [place]

## 2021-11-25 ENCOUNTER — Telehealth: Payer: Self-pay | Admitting: *Deleted

## 2021-11-25 NOTE — Telephone Encounter (Signed)
Cardiac catheterization scheduled at [facility] for: Tuesday November 26, 2021 7:30 [facility] Main Entrance A [facility]) at: 5:30 AM   Diet-no solid food after midnight prior to cath, clear liquids until 5 AM day of procedure.  Medication instructions for procedure: -Hold:  Eliquis-none 11/24/21 until post procedure -Except hold medications usual morning medications can be taken pre-cath with sips of water including aspirin 81 mg.    Must have responsible adult to drive home post procedure and be with patient first 24 hours after arriving home.  [facility] does allow one visitor to accompany you and wait in the hospital waiting room while you are there for your procedure. You and your visitor will be asked to wear a mask once you enter the hospital.   Patient reports does not currently have any new symptoms concerning for COVID-19 and no household members with COVID-19 like illness.     Reviewed procedure instructions with patient.

## 2021-11-26 ENCOUNTER — Encounter (HOSPITAL_COMMUNITY): Payer: Self-pay | Admitting: Cardiovascular Disease

## 2021-11-26 ENCOUNTER — Other Ambulatory Visit: Payer: Self-pay

## 2021-11-26 ENCOUNTER — Ambulatory Visit (HOSPITAL_COMMUNITY)
Admission: RE | Disposition: A | Discharge status: Home / Self Care | Payer: Self-pay | Source: Home / Self Care | Attending: Cardiovascular Disease

## 2021-11-26 ENCOUNTER — Ambulatory Visit (HOSPITAL_COMMUNITY)
Admission: RE | Admit: 2021-11-26 | Discharge: 2021-11-26 | Disposition: A | Discharge status: Home / Self Care | Payer: Medicare Other | Attending: Cardiovascular Disease | Admitting: Cardiovascular Disease

## 2021-11-26 DIAGNOSIS — R079 Chest pain, unspecified: Secondary | ICD-10-CM | POA: Diagnosis not present

## 2021-11-26 DIAGNOSIS — E7800 Pure hypercholesterolemia, unspecified: Secondary | ICD-10-CM | POA: Insufficient documentation

## 2021-11-26 DIAGNOSIS — R9439 Abnormal result of other cardiovascular function study: Secondary | ICD-10-CM | POA: Insufficient documentation

## 2021-11-26 DIAGNOSIS — I471 Supraventricular tachycardia: Secondary | ICD-10-CM | POA: Insufficient documentation

## 2021-11-26 DIAGNOSIS — Z7901 Long term (current) use of anticoagulants: Secondary | ICD-10-CM | POA: Insufficient documentation

## 2021-11-26 DIAGNOSIS — I480 Paroxysmal atrial fibrillation: Secondary | ICD-10-CM | POA: Diagnosis not present

## 2021-11-26 DIAGNOSIS — D6859 Other primary thrombophilia: Secondary | ICD-10-CM | POA: Diagnosis not present

## 2021-11-26 DIAGNOSIS — G4733 Obstructive sleep apnea (adult) (pediatric): Secondary | ICD-10-CM | POA: Diagnosis not present

## 2021-11-26 DIAGNOSIS — I10 Essential (primary) hypertension: Secondary | ICD-10-CM | POA: Diagnosis not present

## 2021-11-26 DIAGNOSIS — R0609 Other forms of dyspnea: Secondary | ICD-10-CM | POA: Insufficient documentation

## 2021-11-26 DIAGNOSIS — I208 Other forms of angina pectoris: Secondary | ICD-10-CM | POA: Diagnosis not present

## 2021-11-26 DIAGNOSIS — Z87891 Personal history of nicotine dependence: Secondary | ICD-10-CM | POA: Diagnosis not present

## 2021-11-26 DIAGNOSIS — R5383 Other fatigue: Secondary | ICD-10-CM | POA: Insufficient documentation

## 2021-11-26 DIAGNOSIS — Z8616 Personal history of COVID-19: Secondary | ICD-10-CM | POA: Diagnosis not present

## 2021-11-26 HISTORY — PX: LEFT HEART CATH AND CORONARY ANGIOGRAPHY: CATH118249

## 2021-11-26 SURGERY — LEFT HEART CATH AND CORONARY ANGIOGRAPHY
Anesthesia: LOCAL

## 2021-11-26 MED ORDER — HEPARIN (PORCINE) IN NACL 1000-0.9 UT/500ML-% IV SOLN
INTRAVENOUS | Status: DC | PRN
  Administered 2021-11-26 (×2): 500 mL

## 2021-11-26 MED ORDER — SODIUM CHLORIDE 0.9% FLUSH: 3.0000 mL | INTRAVENOUS | Status: DC | PRN

## 2021-11-26 MED ORDER — SODIUM CHLORIDE 0.9% FLUSH: 3.0000 mL | Freq: Two times a day (BID) | INTRAVENOUS | Status: DC

## 2021-11-26 MED ORDER — ASPIRIN 81 MG PO CHEW: 81.0000 mg | CHEWABLE_TABLET | ORAL | Status: AC

## 2021-11-26 MED ORDER — DIAZEPAM 5 MG PO TABS: 5.0000 mg | ORAL_TABLET | Freq: Four times a day (QID) | ORAL | Status: DC | PRN

## 2021-11-26 MED ORDER — HYDRALAZINE HCL 20 MG/ML IJ SOLN: 10.0000 mg | INTRAMUSCULAR | Status: DC | PRN

## 2021-11-26 MED ORDER — SODIUM CHLORIDE 0.9 % IV SOLN: 250.0000 mL | INTRAVENOUS | Status: DC | PRN

## 2021-11-26 MED ORDER — LABETALOL HCL 5 MG/ML IV SOLN: 10.0000 mg | INTRAVENOUS | Status: DC | PRN

## 2021-11-26 MED ORDER — HEPARIN SODIUM (PORCINE) 1000 UNIT/ML IJ SOLN
INTRAMUSCULAR | Status: AC
  Filled 2021-11-26: qty 10

## 2021-11-26 MED ORDER — SODIUM CHLORIDE 0.9 % WEIGHT BASED INFUSION: 1.0000 mL/kg/h | INTRAVENOUS | Status: DC

## 2021-11-26 MED ORDER — MIDAZOLAM HCL 2 MG/2ML IJ SOLN
INTRAMUSCULAR | Status: DC | PRN
  Administered 2021-11-26: 1 mg via INTRAVENOUS

## 2021-11-26 MED ORDER — ACETAMINOPHEN 325 MG PO TABS: 650.0000 mg | ORAL_TABLET | ORAL | Status: DC | PRN

## 2021-11-26 MED ORDER — HEPARIN (PORCINE) IN NACL 1000-0.9 UT/500ML-% IV SOLN
INTRAVENOUS | Status: AC
  Filled 2021-11-26: qty 1000

## 2021-11-26 MED ORDER — SODIUM CHLORIDE 0.9 % IV SOLN: INTRAVENOUS | Status: DC

## 2021-11-26 MED ORDER — ONDANSETRON HCL 4 MG/2ML IJ SOLN: 4.0000 mg | Freq: Four times a day (QID) | INTRAMUSCULAR | Status: DC | PRN

## 2021-11-26 MED ORDER — MIDAZOLAM HCL 2 MG/2ML IJ SOLN
INTRAMUSCULAR | Status: AC
  Filled 2021-11-26: qty 2

## 2021-11-26 MED ORDER — VERAPAMIL HCL 2.5 MG/ML IV SOLN
INTRAVENOUS | Status: AC
  Filled 2021-11-26: qty 2

## 2021-11-26 MED ORDER — LIDOCAINE HCL (PF) 1 % IJ SOLN
INTRAMUSCULAR | Status: AC
  Filled 2021-11-26: qty 30

## 2021-11-26 MED ORDER — FENTANYL CITRATE (PF) 100 MCG/2ML IJ SOLN
INTRAMUSCULAR | Status: AC
  Filled 2021-11-26: qty 2

## 2021-11-26 MED ORDER — SODIUM CHLORIDE 0.9 % WEIGHT BASED INFUSION
3.0000 mL/kg/h | INTRAVENOUS | Status: AC
  Administered 2021-11-26: 3 mL/kg/h via INTRAVENOUS

## 2021-11-26 MED ORDER — FENTANYL CITRATE (PF) 100 MCG/2ML IJ SOLN
INTRAMUSCULAR | Status: DC | PRN
  Administered 2021-11-26: 25 ug via INTRAVENOUS

## 2021-11-26 MED ORDER — LIDOCAINE HCL (PF) 1 % IJ SOLN
INTRAMUSCULAR | Status: DC | PRN
  Administered 2021-11-26: 15 mL via INTRADERMAL
  Administered 2021-11-26: 2 mL via INTRADERMAL

## 2021-11-26 MED ORDER — IOHEXOL 350 MG/ML SOLN
INTRAVENOUS | Status: DC | PRN
  Administered 2021-11-26: 40 mL

## 2021-11-26 SURGICAL SUPPLY — 11 items
CATH INFINITI 5FR MULTPACK ANG (CATHETERS) ×1 IMPLANT
CLOSURE MYNX CONTROL 5F (Vascular Products) ×1 IMPLANT
GLIDESHEATH SLEND SS 6F .021 (SHEATH) ×1 IMPLANT
GUIDEWIRE INQWIRE 1.5J.035X260 (WIRE) IMPLANT
INQWIRE 1.5J .035X260CM (WIRE) ×2
KIT HEART LEFT (KITS) ×3 IMPLANT
PACK CARDIAC CATHETERIZATION (CUSTOM PROCEDURE TRAY) ×3 IMPLANT
SHEATH PINNACLE 5F 10CM (SHEATH) ×1 IMPLANT
TRANSDUCER W/STOPCOCK (MISCELLANEOUS) ×3 IMPLANT
TUBING CIL FLEX 10 FLL-RA (TUBING) ×3 IMPLANT
WIRE EMERALD 3MM-J .035X150CM (WIRE) ×1 IMPLANT

## 2021-11-26 NOTE — Interval H&P Note (Signed)
Cath Lab Visit (complete for each Cath Lab visit)  Clinical Evaluation Leading to the Procedure:   ACS: No.  Non-ACS:    Anginal Classification: CCS II  Anti-ischemic medical therapy: Maximal Therapy (2 or more classes of medications)  Non-Invasive Test Results: No non-invasive testing performed  Prior CABG: No previous CABG      History and Physical Interval Note:  11/26/2021 7:40 AM  Meagan Key  has presented today for surgery, with the diagnosis of abnormal stress test.  The various methods of treatment have been discussed with the patient and family. After consideration of risks, benefits and other options for treatment, the patient has consented to  Procedure(s): LEFT HEART CATH AND CORONARY ANGIOGRAPHY (N/A) as a surgical intervention.  The patient's history has been reviewed, patient examined, no change in status, stable for surgery.  I have reviewed the patient's chart and labs.  Questions were answered to the patient's satisfaction.     Shelva Majestic

## 2021-11-27 MED FILL — Verapamil HCl IV Soln 2.5 MG/ML: INTRAVENOUS | Qty: 2 | Status: AC

## 2021-11-28 ENCOUNTER — Other Ambulatory Visit: Payer: Self-pay | Admitting: Cardiology

## 2021-12-27 ENCOUNTER — Ambulatory Visit: Payer: Medicare Other | Admitting: Cardiology

## 2022-01-06 ENCOUNTER — Encounter: Payer: Self-pay | Admitting: Family Medicine

## 2022-01-22 ENCOUNTER — Other Ambulatory Visit: Payer: Self-pay | Admitting: *Deleted

## 2022-01-22 DIAGNOSIS — N9089 Other specified noninflammatory disorders of vulva and perineum: Secondary | ICD-10-CM

## 2022-01-28 ENCOUNTER — Other Ambulatory Visit: Payer: Self-pay | Admitting: Internal Medicine

## 2022-01-28 DIAGNOSIS — Z1231 Encounter for screening mammogram for malignant neoplasm of breast: Secondary | ICD-10-CM

## 2022-02-05 DIAGNOSIS — L820 Inflamed seborrheic keratosis: Secondary | ICD-10-CM | POA: Diagnosis not present

## 2022-02-05 DIAGNOSIS — D1801 Hemangioma of skin and subcutaneous tissue: Secondary | ICD-10-CM | POA: Diagnosis not present

## 2022-02-05 DIAGNOSIS — L821 Other seborrheic keratosis: Secondary | ICD-10-CM | POA: Diagnosis not present

## 2022-02-05 DIAGNOSIS — L720 Epidermal cyst: Secondary | ICD-10-CM | POA: Diagnosis not present

## 2022-02-13 ENCOUNTER — Other Ambulatory Visit: Payer: Self-pay | Admitting: Cardiology

## 2022-02-13 NOTE — Telephone Encounter (Signed)
Prescription refill request for Eliquis received. ?Indication:Afib ?Last office visit:2/23 ?Scr:1.0 ?Age: 76 ?Weight:65.8 kg ? ?Prescription refilled ? ?

## 2022-02-14 ENCOUNTER — Encounter: Payer: Self-pay | Admitting: Cardiology

## 2022-02-14 ENCOUNTER — Ambulatory Visit: Payer: Medicare Other | Admitting: Cardiology

## 2022-02-14 ENCOUNTER — Ambulatory Visit (INDEPENDENT_AMBULATORY_CARE_PROVIDER_SITE_OTHER): Payer: Medicare Other | Admitting: Cardiology

## 2022-02-14 VITALS — BP 130/84 | HR 65 | Ht 61.0 in | Wt 149.0 lb

## 2022-02-14 DIAGNOSIS — E785 Hyperlipidemia, unspecified: Secondary | ICD-10-CM | POA: Diagnosis not present

## 2022-02-14 DIAGNOSIS — I208 Other forms of angina pectoris: Secondary | ICD-10-CM | POA: Diagnosis not present

## 2022-02-14 DIAGNOSIS — I10 Essential (primary) hypertension: Secondary | ICD-10-CM | POA: Diagnosis not present

## 2022-02-14 DIAGNOSIS — G4730 Sleep apnea, unspecified: Secondary | ICD-10-CM

## 2022-02-14 DIAGNOSIS — I471 Supraventricular tachycardia: Secondary | ICD-10-CM

## 2022-02-14 DIAGNOSIS — I480 Paroxysmal atrial fibrillation: Secondary | ICD-10-CM | POA: Diagnosis not present

## 2022-02-14 NOTE — Progress Notes (Signed)
?  ?Cardiology Office Note ? ? ?Date:  02/14/2022  ? ?ID:  Meagan Key, DOB 1946/05/23, MRN [id number] ? ?PCP:  [name], MD  ?Cardiologist:   Minus Breeding, MD ? ? ?Chief Complaint  ?Patient presents with  ? Edema  ?  Ankles.  ? ? ? ?  ?History of Present Illness: ?Meagan Key is a 76 y.o. female who presents for followup of atrial fib.  She had atrial fib with RVR in Sept and was in the ED.  She converted spontaneously.  She was followed in the atrial fib clinic.    She was started on Eliquis.  She did have some bleeding gums.  Of note she did have chest pain during her RVR.  She had a low risk POET (Plain Old Exercise Treadmill).   ? ?Since I last saw her she had chest pain and she had a cardiac cath.   She had normal coronary arteries.   She has done well.  She did have an abnormal stress test at that time.  Her initial symptoms were chest pain while walking on the treadmill.  She has now been ratcheting and down and not getting her heart rate up into the 170s like she was she is not having any further cardiovascular symptoms.  The patient denies any new symptoms such as chest discomfort, neck or arm discomfort. There has been no new shortness of breath, PND or orthopnea. There have been no reported palpitations, presyncope or syncope.  ? ?Past Medical History:  ?Diagnosis Date  ? Allergy   ? Arthritis   ? hands  ? Asthma   ? rarely uses inhaler  ? Atrial fibrillation (Meagan Key)   ? Cataracts, bilateral   ? Chicken pox   ? Endometriosis   ? Esophageal dysfunction   ? GERD (gastroesophageal reflux disease)   ? Glaucoma   ? surg for preventive for NARROW ANGLE  ? Hematuria   ? Hx of colonic polyp 06-12-2003  ? Colonoscopy Dr Amedeo Plenty  ? Hyperlipemia   ? Hypertension   ? Lichen planus   ? Osteopenia 05/2013  ? T score -2.3 FRAX 13%/2.7%  ? Sleep apnea   ? Borderline.   Did not require sleeping.    ? SVT (supraventricular tachycardia) ([place])   ? Thyroid disease   ? nodule on thyroid  ? Tubular adenoma of colon    ? ? ?Past Surgical History:  ?Procedure Laterality Date  ? APPENDECTOMY    ? BREAST CYST EXCISION  AGE 26, 23, 67  ? bilateral  ? BREAST EXCISIONAL BIOPSY Bilateral   ? CATARACT EXTRACTION    ? CATARACT EXTRACTION    ? x2  ? COLONOSCOPY    ? DILATATION & CURETTAGE/HYSTEROSCOPY WITH TRUECLEAR N/A 02/22/2014  ? Procedure: DILATATION & CURETTAGE/HYSTEROSCOPY WITH TRUCLEAR;  Surgeon: [name], MD;  Location: [facility];  Service: Gynecology;  Laterality: N/A;  ? ESOPHAGEAL MANOMETRY N/A 11/29/2012  ? Procedure: ESOPHAGEAL MANOMETRY (EM);  Surgeon: Sable Feil, MD;  Location: [facility];  Service: Endoscopy;  Laterality: N/A;  ? EYE SURGERY  2010  ? 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  ? LAPAROSCOPIC ENDOMETRIOSIS FULGURATION  1972  ? LEFT HEART CATH AND CORONARY ANGIOGRAPHY N/A 11/26/2021  ? Procedure: LEFT HEART CATH AND CORONARY ANGIOGRAPHY;  Surgeon: Troy Sine, MD;  Location: [facility];  Service: Cardiovascular;  Laterality: N/A;  ? OOPHORECTOMY  1969  ? RSO  ? POLYPECTOMY    ? THYROIDECTOMY,  PARTIAL  AGE 48  ? TONSILLECTOMY    ? UPPER GASTROINTESTINAL ENDOSCOPY  2013  ? ? ? ?Current Outpatient Medications  ?Medication Sig Dispense Refill  ? acetaminophen (TYLENOL) 500 MG tablet Take 500 mg by mouth every 8 (eight) hours as needed for moderate pain.    ? albuterol (VENTOLIN HFA) 108 (90 Base) MCG/ACT inhaler Inhale 2 puffs into the lungs every 6 (six) hours as needed for wheezing or shortness of breath.    ? alendronate (FOSAMAX) 70 MG tablet Take 70 mg by mouth every Wednesday.    ? amLODipine (NORVASC) 5 MG tablet Take 1 tablet (5 mg total) by mouth daily. 90 tablet 3  ? Artificial Tear Solution (GENTEAL TEARS) 0.1-0.2-0.3 % SOLN Place 1 drop into both eyes daily as needed (dry eyes).    ? Calcium Citrate (CITRACAL PO) Take 2 tablets by mouth daily. 500 mg Calcium & 1000 units of Vitamin D    ? chlorpheniramine (CHLOR-TRIMETON) 4 MG tablet Take 4 mg by mouth every 6 (six) hours  as needed for allergies.    ? ELIQUIS 5 MG TABS tablet TAKE ONE TABLET BY MOUTH TWICE DAILY 180 tablet 1  ? esomeprazole (NEXIUM) 20 MG capsule Take 20 mg by mouth daily.     ? metoprolol succinate (TOPROL-XL) 100 MG 24 hr tablet TAKE 1 TABLET ONCE DAILY. 90 tablet 1  ? pravastatin (PRAVACHOL) 40 MG tablet TAKE ONE TABLET BY MOUTH ONCE DAILY. 90 tablet 3  ? ?No current facility-administered medications for this visit.  ? ? ?Allergies:   Nitrofurantoin, Cortisone, Lisinopril, and Symbicort [budesonide-formoterol fumarate]  ? ? ?ROS:  Please see the history of present illness.   Otherwise, review of systems are positive for none.   All other systems are reviewed and negative.  ? ? ?PHYSICAL EXAM: ?VS:  BP 130/84 (BP Location: Left Arm, Patient Position: Sitting, Cuff Size: Normal)   Pulse 65   Ht '5\' 1"'$  (1.549 m)   Wt 149 lb (67.6 kg)   BMI 28.15 kg/m?  , BMI Body mass index is 28.15 kg/m?.  ?GENERAL:  Well appearing ?NECK:  No jugular venous distention, waveform within normal limits, carotid upstroke brisk and symmetric, no bruits, no thyromegaly ?LUNGS:  Clear to auscultation bilaterally ?CHEST:  Unremarkable ?HEART:  PMI not displaced or sustained,S1 and S2 within normal limits, no S3, no S4, no clicks, no rubs, no murmurs ?ABD:  Flat, positive bowel sounds normal in frequency in pitch, no bruits, no rebound, no guarding, no midline pulsatile mass, no hepatomegaly, no splenomegaly ?EXT:  2 plus pulses throughout, no edema, no cyanosis no clubbing ? ?EKG:  EKG is not ordered today. ? ? ? ?Recent Labs: ?11/21/2021: BUN 23; Creatinine, Ser 0.93; Hemoglobin 12.3; Platelets 189; Potassium 4.8; Sodium 139  ? ? ?Lipid Panel ?   ?Component Value Date/Time  ? CHOL 168 01/28/2011 0855  ? TRIG 41.0 01/28/2011 0855  ? HDL 91.90 01/28/2011 0855  ? CHOLHDL 2 01/28/2011 0855  ? VLDL 8.2 01/28/2011 0855  ? [place] 68 01/28/2011 0855  ? ?  ? ?Wt Readings from Last 3 Encounters:  ?02/14/22 149 lb (67.6 kg)  ?11/26/21 145 lb (65.8  kg)  ?11/21/21 149 lb (67.6 kg)  ?  ? ? ?Other studies Reviewed: ?Additional studies/ records that were reviewed today include:  Cath ?Review of the above records demonstrates:   See elsewhere ? ? ?ASSESSMENT AND PLAN: ? ?ATRIAL FIB:  Meagan Key has a CHA2DS2 - VASc score  of 2.  She has had no symptomatic recurrence.  No change in therapy.   ? ?CHEST PAIN: She has not had any further discomfort since that time.  She will go and see her gastroenterologist or primary provider if she has recurrent symptoms. ? ?SLEEP APNEA:    She uses CPAP 4 to 5 hours a day and this works well. ? ?HTN:    The blood pressure is at target.  No change in therapy.  ? ? ?DYSLIPIDEMIA: Her LDL was 109 with an HDL of 76.  No change in therapy.  ? ? ?Current medicines are reviewed at length with the patient today.  The patient does not have concerns regarding medicines. ? ?The following changes have been made: None ? ?Labs/ tests ordered today include: None ? ?No orders of the defined types were placed in this encounter. ? ? ? ? ?Disposition:   FU with me in 12 months.   ? ? ?Signed, ?Minus Breeding, MD  ?02/14/2022 3:08 PM    ?[place] ?

## 2022-02-14 NOTE — Patient Instructions (Signed)
Medication Instructions:  ?Your Physician recommend you continue on your current medication as directed.   ? ?*If you need a refill on your cardiac medications before your next appointment, please call your pharmacy* ? ? ?Lab Work: ?None ordered today ? ? ?Testing/Procedures: ?None ordered today ? ?Follow-Up: ?At [facility], you and your health needs are our priority.  As part of our continuing mission to provide you with exceptional heart care, we have created designated Provider Care Teams.  These Care Teams include your primary Cardiologist (physician) and Advanced Practice Providers (APPs -  Physician Assistants and Nurse Practitioners) who all work together to provide you with the care you need, when you need it. ? ?We recommend signing up for the patient portal called "MyChart".  Sign up information is provided on this After Visit Summary.  MyChart is used to connect with patients for Virtual Visits (Telemedicine).  Patients are able to view lab/test results, encounter notes, upcoming appointments, etc.  Non-urgent messages can be sent to your provider as well.   ?To learn more about what you can do with MyChart, go to [email or web address].   ? ?Your next appointment:   ?1 year(s) ? ?The format for your next appointment:   ?In Person ? ?Provider:   ?James Hochrein, MD { ? ?Important Information About Sugar ? ? ? ? ? ? ?

## 2022-02-19 NOTE — Progress Notes (Signed)
GYNECOLOGY  VISIT ?  ?HPI: ?76 y.o.   Married  Caucasian  female   ?[id number] with No LMP recorded. Patient is postmenopausal.   ?here for vulvar biopsy.   ? ?She has some stinging.  ?Contact with wipes is uncomfortable.  ?It can also itch.  ? ?Pelvic exams are painful. ? ?Has lichen planus of mouth.  ?She treats this with Flagyl.  ?Noted to have a lesion right introitus.  ? ?On Eliquis bid for atrial fibrillation.  ? ?GYNECOLOGIC HISTORY: ?No LMP recorded. Patient is postmenopausal. ?Contraception:  PMP ?Menopausal hormone therapy:  none ?Last mammogram:  03/11/21- birads 1 neg  ?Last pap smear:   09/23/18- WNL, 09/15/16- WNL, 09/12/15- WNL ?       ?OB History   ? ? Gravida  ?2  ? Para  ?2  ? Term  ?2  ? Preterm  ?   ? AB  ?   ? Living  ?2  ?  ? ? SAB  ?   ? IAB  ?   ? Ectopic  ?   ? Multiple  ?   ? Live Births  ?   ?   ?  ?  ?    ? ?Patient Active Problem List  ? Diagnosis Date Noted  ? SVT (supraventricular tachycardia) ([place]) 12/05/2020  ? Educated about COVID-19 virus infection 01/05/2020  ? PAF (paroxysmal atrial fibrillation) ([place]) 12/16/2018  ? Dyslipidemia 12/16/2018  ? Chronic atrial fibrillation ([place]) 08/06/2018  ? Exertional chest pain 08/06/2018  ? Pseudophakia of right eye 02/27/2016  ? Pseudophakia of left eye 02/06/2016  ? Anatomical narrow angle of both eyes 02/04/2016  ? 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 Obstructive sleep apnea 11/19/2012  ? Dysphonia 11/19/2012  ? Thyroid nodule 09/06/2012  ? Osteopenia   ? Hypertension   ? Glaucoma   ? HYPERLIPIDEMIA 12/09/2009  ? HYPERTENSION 12/09/2009  ? Unspecified asthma(493.90) 11/30/2009  ? ? ?Past Medical History:  ?Diagnosis Date  ? Allergy   ? Arthritis   ? hands  ? Asthma   ? rarely uses inhaler  ? Atrial fibrillation ([place])   ? Cataracts, bilateral   ? Chicken pox   ? Endometriosis   ? Esophageal dysfunction   ? GERD (gastroesophageal reflux disease)   ? Glaucoma   ? surg for preventive for [place]  ? Hematuria   ? Hx of colonic polyp  06-12-2003  ? Colonoscopy Dr Amedeo Plenty  ? Hyperlipemia   ? Hypertension   ? Lichen planus   ? Osteopenia 05/2013  ? T score -2.3 FRAX 13%/2.7%  ? Sleep apnea   ? Borderline.   Did not require sleeping.    ? SVT (supraventricular tachycardia) ([place])   ? Thyroid disease   ? nodule on thyroid  ? Tubular adenoma of colon   ? ? ?Past Surgical History:  ?Procedure Laterality Date  ? APPENDECTOMY    ? BREAST CYST EXCISION  AGE 43, 25, 20  ? bilateral  ? BREAST EXCISIONAL BIOPSY Bilateral   ? CATARACT EXTRACTION    ? CATARACT EXTRACTION    ? x2  ? COLONOSCOPY    ? DILATATION & CURETTAGE/HYSTEROSCOPY WITH TRUECLEAR N/A 02/22/2014  ? Procedure: DILATATION & CURETTAGE/HYSTEROSCOPY WITH TRUCLEAR;  Surgeon: [name], MD;  Location: [facility];  Service: Gynecology;  Laterality: N/A;  ? ESOPHAGEAL MANOMETRY N/A 11/29/2012  ? Procedure: ESOPHAGEAL MANOMETRY (EM);  Surgeon: Sable Feil, MD;  Location: [facility];  Service: Endoscopy;  Laterality: N/A;  ?  EYE SURGERY  2010  ? 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  ? LAPAROSCOPIC ENDOMETRIOSIS FULGURATION  1972  ? LEFT HEART CATH AND CORONARY ANGIOGRAPHY N/A 11/26/2021  ? Procedure: LEFT HEART CATH AND CORONARY ANGIOGRAPHY;  Surgeon: Troy Sine, MD;  Location: [facility];  Service: Cardiovascular;  Laterality: N/A;  ? OOPHORECTOMY  1969  ? RSO  ? POLYPECTOMY    ? THYROIDECTOMY, PARTIAL  AGE 57  ? TONSILLECTOMY    ? UPPER GASTROINTESTINAL ENDOSCOPY  2013  ? ? ?Current Outpatient Medications  ?Medication Sig Dispense Refill  ? acetaminophen (TYLENOL) 500 MG tablet Take 500 mg by mouth every 8 (eight) hours as needed for moderate pain.    ? albuterol (VENTOLIN HFA) 108 (90 Base) MCG/ACT inhaler Inhale 2 puffs into the lungs every 6 (six) hours as needed for wheezing or shortness of breath.    ? alendronate (FOSAMAX) 70 MG tablet Take 70 mg by mouth every Wednesday.    ? Artificial Tear Solution (GENTEAL TEARS) 0.1-0.2-0.3 % SOLN Place 1 drop into both eyes  daily as needed (dry eyes).    ? atorvastatin (LIPITOR) 10 MG tablet     ? Calcium Citrate (CITRACAL PO) Take 2 tablets by mouth daily. 500 mg Calcium & 1000 units of Vitamin D    ? chlorpheniramine (CHLOR-TRIMETON) 4 MG tablet Take 4 mg by mouth every 6 (six) hours as needed for allergies.    ? ELIQUIS 5 MG TABS tablet TAKE ONE TABLET BY MOUTH TWICE DAILY 180 tablet 1  ? esomeprazole (NEXIUM) 20 MG capsule Take 20 mg by mouth daily.     ? metoprolol succinate (TOPROL-XL) 100 MG 24 hr tablet TAKE 1 TABLET ONCE DAILY. 90 tablet 1  ? amLODipine (NORVASC) 5 MG tablet Take 1 tablet (5 mg total) by mouth daily. 90 tablet 3  ? ?No current facility-administered medications for this visit.  ?  ? ?ALLERGIES: Nitrofurantoin, Cortisone, Lisinopril, and Symbicort [budesonide-formoterol fumarate] ? ?Family History  ?Problem Relation Age of Onset  ? Colon cancer Mother 26  ? Alcohol abuse Mother   ? Mental illness Mother   ? Lung cancer Father   ?     smoker  ? Alcohol abuse Father   ? Hypertension Father   ? Colon cancer Father 54  ? Breast cancer Maternal Aunt   ?     Age 63  ? Heart attack Paternal Grandmother   ? Other Cousin   ?     mother side-vocal cord cancer  ? Breast cancer Cousin 30  ? ? ?Social History  ? ?Socioeconomic History  ? Marital status: Married  ?  Spouse name: Not on file  ? Number of children: 2  ? Years of education: Not on file  ? Highest education level: Not on file  ?Occupational History  ? Occupation: attorney  ?  Employer: [employer]  ?Tobacco Use  ? Smoking status: Former  ?  Packs/day: 0.30  ?  Years: 25.00  ?  Pack years: 7.50  ?  Types: Cigarettes  ?  Quit date: 09/14/1984  ?  Years since quitting: 37.4  ? Smokeless tobacco: Never  ?Vaping Use  ? Vaping Use: Never used  ?Substance and Sexual Activity  ? Alcohol use: Yes  ?  Comment: once a week  ? Drug use: No  ? Sexual activity: Not Currently  ?  Birth control/protection: Post-menopausal  ?  Comment: 1st intercourse 76 yo-Fewer than 5  partners  ?  Other Topics Concern  ? Not on file  ?Social History Narrative  ? MARRIED; 2 CHILDREN  ? LAW DEGREE; ATTORNEY  ? ?Social Determinants of Health  ? ?Financial Resource Strain: Not on file  ?Food Insecurity: Not on file  ?Transportation Needs: Not on file  ?Physical Activity: Not on file  ?Stress: Not on file  ?Social Connections: Not on file  ?Intimate Partner Violence: Not on file  ? ? ?Review of Systems  ?All other systems reviewed and are negative. ? ?PHYSICAL EXAMINATION:   ? ?BP (!) 142/78   Pulse 61   Ht '5\' 1"'$  (1.549 m)   Wt 150 lb (68 kg)   SpO2 98%   BMI 28.34 kg/m?     ?General appearance: alert, cooperative and appears stated age ?  ?Pelvic: External genitalia:  patch of flat erythema of the introitus right and left side, tender to touch. ?             Urethra:  normal appearing urethra with no masses, tenderness or lesions ?      ?      ?Vulvar biopsy ?Consent for procedure.  ?Sterile prep with betadine.  ?Local 1% lidocaine, lot [id number], exp 02/18/23.  ?4 mm punch biopsy.  ?Tissue to pathology.  ?Single suture of 3/0 Vicryl. ?Minimal EBL.  ?No complications.  ? ?Chaperone was present for exam:  Estill Bamberg, CMA ? ?ASSESSMENT ? ?Vulvar lesions.  I suspect possible lichen planus. ? ?PLAN ? ?Fu biopsy result.  ?Final plan to follow.  ?Plan for breast and pelvic exam and pap in December, 2023.  ?  ?An After Visit Summary was printed and given to the patient. ? ? ? ?

## 2022-02-24 ENCOUNTER — Other Ambulatory Visit (HOSPITAL_COMMUNITY)
Admission: RE | Admit: 2022-02-24 | Discharge: 2022-02-24 | Disposition: A | Discharge status: Home / Self Care | Payer: Medicare Other | Source: Ambulatory Visit | Attending: Obstetrics and Gynecology | Admitting: Obstetrics and Gynecology

## 2022-02-24 ENCOUNTER — Ambulatory Visit (INDEPENDENT_AMBULATORY_CARE_PROVIDER_SITE_OTHER): Payer: Medicare Other | Admitting: Obstetrics and Gynecology

## 2022-02-24 ENCOUNTER — Encounter: Payer: Self-pay | Admitting: Obstetrics and Gynecology

## 2022-02-24 DIAGNOSIS — N9089 Other specified noninflammatory disorders of vulva and perineum: Secondary | ICD-10-CM | POA: Diagnosis not present

## 2022-02-24 DIAGNOSIS — N7689 Other specified inflammation of vagina and vulva: Secondary | ICD-10-CM | POA: Diagnosis not present

## 2022-02-24 NOTE — Patient Instructions (Signed)
Vulva Biopsy, Care After The following information offers guidance on how to care for yourself after your procedure. Your health care provider may also give you more specific instructions. If you have problems or questions, contact your health care provider. What can I expect after the procedure? After the procedure, it is common to have: Slight bleeding from the biopsy site. Slight pain or discomfort at the biopsy site. Follow these instructions at home: Biopsy site care  Follow instructions from your health care provider about how to take care of your biopsy site. Make sure you: Clean the area using water and mild soap twice a day or as told by your health care provider. Gently pat the area dry. You may shower 24 hours after the procedure. If you were prescribed an antibiotic ointment, apply it as told by your health care provider. Do not stop using the antibiotic even if your condition improves. If told by your health care provider, take a sitz bath to help with pain and discomfort. This is a warm water bath that you take while sitting down. Do this as often as told by your health care provider. The water should only come up to your hips and cover your buttocks. You may pat the area dry with a soft, clean towel. Leave stitches (sutures), skin glue, or adhesive strips in place. These skin closures may need to stay in place for 2 weeks or longer. If adhesive strip edges start to loosen and curl up, you may trim the loose edges. Do not remove adhesive strips completely unless your health care provider tells you to do that. Check your biopsy site every day for signs of infection. It may be helpful to use a handheld mirror to do this. Check for: Redness, swelling, or more pain. More fluid or blood. Warmth. Pus or a bad smell. Do not rub the biopsy area after urinating. Gently pat the area dry or use a bottle filled with warm water (peri bottle) to clean the area. Gently wipe from front to  back. Lifestyle Wear loose, cotton underwear. Do not wear tight pants. For at least 1 week or until your health care provider approves: Do not use tampons, douche, or put anything inside your vagina. Do not have sex. Until your health care provider approves: Do not exercise, such as running or biking. Do not swim or use a hot tub. General instructions Take over-the-counter and prescription medicines only as told by your health care provider. Drink enough fluid to keep your urine pale yellow. Use a sanitary napkin until the bleeding stops. If told, put ice on the biopsy site. To do this: Place ice in a plastic bag. Place a towel between your skin and the bag. Leave the ice on for 20 minutes, 2-3 times a day. Remove the ice if your skin turns bright red. This is very important. If you cannot feel pain, heat, or cold, you have a greater risk of damage to the area. Keep all follow-up visits. This is important. Contact a health care provider if: You have redness, swelling, or more pain around your biopsy site. You have more fluid or blood coming from your biopsy site. Your biopsy site feels warm to the touch. Your pain is not controlled with medicine or ice packs. You have a fever or chills. Get help right away if: You have heavy bleeding from the vulva. You have pus or a bad smell coming from the biopsy site. You have abdominal pain. Summary After the procedure, it   is common to have slight bleeding and discomfort at the biopsy site. Follow instructions from your health care provider after your biopsy. Take sitz baths as told by your health care provider to help with pain and discomfort. Leave any sutures in place. Contact your health care provider if you notice any signs of infection around the biopsy site, including redness, swelling, more pain, more fluid or blood, or warmth. Keep all follow-up visits. This is important. This information is not intended to replace advice given to you  by your health care provider. Make sure you discuss any questions you have with your health care provider. Document Revised: 06/25/2021 Document Reviewed: 06/25/2021 Elsevier Patient Education  2023 [employer]vier Inc.  

## 2022-03-04 LAB — SURGICAL PATHOLOGY

## 2022-03-05 ENCOUNTER — Telehealth: Payer: Self-pay | Admitting: Obstetrics and Gynecology

## 2022-03-05 ENCOUNTER — Other Ambulatory Visit: Payer: Self-pay | Admitting: *Deleted

## 2022-03-05 NOTE — Telephone Encounter (Signed)
Patient informed with below, states she is already has a dermatologist in [place] and will check to see if this is something she can help with. Patient said she will call me back and let me know either way.  ?

## 2022-03-05 NOTE — Telephone Encounter (Signed)
Please contact patient regarding treatment options for lichen planus of the vulva.  ? ?I discontinued the order for the clobetasol ointment due to her allergy list showing atrial fibrillation with use of some steroid products.  ? ?I am recommending a referral to dermatologist/vulvar specialist at [facility] with Dr. Earnie Larsson for further evaluation and treatment.  ? ?There are other medications that can be prescribed for treating this condition that are best prescribed by a skin specialist.  ?

## 2022-03-05 NOTE — Telephone Encounter (Signed)
Dr.Silva called patient regarding results: ? ?Please contact patient with result of her vulvar biopsy showing lichenoid inflammation, which appears to be consistent with lichen planus.  ?No precancer or cancer were seen.  ?I am recommending a prescription for Clobetasol ointment 0.05%, apply to affected area in a thin layer nightly for two months.  ?Disp:  60 grams ?RF:  none ?Please schedule a recheck with me in 2 months. ? ?Please see pending allergy/contraindications regarding Rx ointment. I have Rx pending for you to approval.  ? ? ? ? ? ? ? ? ?

## 2022-03-06 NOTE — Telephone Encounter (Signed)
2 month appointment canceled.

## 2022-03-06 NOTE — Telephone Encounter (Signed)
Office notes faxed to [place] office they will call to schedule. Patient given office # [phone] to call if she doesn't hear anything and to also contact me if needs anything.    Dr.Silva patient is scheduled for 2 month follow up after using temovate ointment. Since she is not going to be prescribed the ointment do we cancel that appointment?

## 2022-03-06 NOTE — Telephone Encounter (Signed)
Ok to cancel patient's follow up appointment with me in 2 months.   You may close this encounter.

## 2022-03-18 ENCOUNTER — Ambulatory Visit
Admission: RE | Admit: 2022-03-18 | Discharge: 2022-03-18 | Disposition: A | Discharge status: Home / Self Care | Payer: Medicare Other | Source: Ambulatory Visit | Attending: Internal Medicine | Admitting: Internal Medicine

## 2022-03-18 ENCOUNTER — Telehealth: Payer: Self-pay | Admitting: Family Medicine

## 2022-03-18 DIAGNOSIS — Z1231 Encounter for screening mammogram for malignant neoplasm of breast: Secondary | ICD-10-CM | POA: Diagnosis not present

## 2022-03-18 NOTE — Progress Notes (Unsigned)
PATIENT: Meagan Key DOB: 1946-08-26  REASON FOR VISIT: follow up HISTORY FROM: patient  No chief complaint on file.    HISTORY OF PRESENT ILLNESS:  03/18/22 ALL:  Meagan Key returns for follow up for OSA on CPAP. She was seen 10/2021 and doing well. Leak was noted but improved with recheck in 12/2021. She called 03/18/2022 reporting more concerns of dry eyes. She is using     11/07/2021 ALL: Meagan Key is a 76 y.o. female here today for follow up for OSA on CPAP.  She was last seen by Dr Rexene Alberts for initial CPAP visit in 03/2021. Compliance was excellent, however, she had a leak on the higher side of 34l/min. She is using a nasal pillow. She reports that it fits great if she sleeps on her back. If she is on her side, it leaks. She feels nasal pillow is very comfortable. She is feeling well. Sleeping well. She was diagnosed with Covid 12/21. She stopped using CPAP for about 3 weeks. She has recently resumed therapy and doing well.     HISTORY: (copied from Dr Guadelupe Sabin previous note)   Meagan Key is a 76 year old right-handed woman with an underlying medical history of MGUS, hypertension, hyperlipidemia, asthma, vitamin D deficiency, paroxysmal A. fib, glaucoma, reflux disease, osteopenia, SVT, and mildly overweight state, who presents for follow-up consultation of her obstructive sleep apnea after interim testing and starting AutoPap therapy.  The patient is unaccompanied today.  I first met her at the request of her primary care physician on 10/03/2020, at which time she reported snoring and excessive daytime somnolence.  She had a prior diagnosis of mild obstructive sleep apnea in 2014.  She was advised to proceed with sleep testing.  She had a home sleep test on 10/31/2020 which indicated severe obstructive sleep apnea with an AHI of 39.4/h, O2 nadir 79%.  She was advised to start AutoPap therapy at home.  Her set up date was 01/02/2021.   Today, 04/01/2021: I reviewed her AutoPap  compliance data from 02/26/2021 through 03/27/2021, which is a total of 30 days, during which time she used her machine every night with percent use days greater than 4 hours at 97%, indicating excellent compliance with an average usage of 5 hours and 4 minutes, residual AHI 4/h, on the higher end of the goal, 95th percentile of pressure at 11.7 cm with a range of 7 to 12 cm with EPR, leak on the higher side with the 95th percentile at 34.4 L/min.  She reports doing fairly well, she has adjusted to treatment, feels like it is somewhat helpful, she has been using the nasal cushion interface but has had some trouble getting a replacement every month and also a replacement of her filter.  She eventually received supplies after calling her DME company several times.  She is overall motivated to continue with treatment.  She does sleep about 7 to 8 hours on average and admits to taking off her mask once she has achieved more than 4 hours of usage as she does have some discomfort and air leaks from time to time and dislodging of the mask is cumbersome for her. She feels that the seal is best when she sleeps on her back but she gets tired sleeping on her back.  Per husband's feedback, her snoring has improved, her nocturia is also better.   REVIEW OF SYSTEMS: Out of a complete 14 system review of symptoms, the patient complains only of the following symptoms,  none and all other reviewed systems are negative.   ALLERGIES: Allergies  Allergen Reactions   Nitrofurantoin Shortness Of Breath and Other (See Comments)    Itching in throat    Cortisone     A-fib   Lisinopril Itching and Other (See Comments)    SOB and itching is in throat   Symbicort [Budesonide-Formoterol Fumarate]     A-fib    HOME MEDICATIONS: Outpatient Medications Prior to Visit  Medication Sig Dispense Refill   acetaminophen (TYLENOL) 500 MG tablet Take 500 mg by mouth every 8 (eight) hours as needed for moderate pain.     albuterol  (VENTOLIN HFA) 108 (90 Base) MCG/ACT inhaler Inhale 2 puffs into the lungs every 6 (six) hours as needed for wheezing or shortness of breath.     alendronate (FOSAMAX) 70 MG tablet Take 70 mg by mouth every Wednesday.     amLODipine (NORVASC) 5 MG tablet Take 1 tablet (5 mg total) by mouth daily. 90 tablet 3   Artificial Tear Solution (GENTEAL TEARS) 0.1-0.2-0.3 % SOLN Place 1 drop into both eyes daily as needed (dry eyes).     atorvastatin (LIPITOR) 10 MG tablet      Calcium Citrate (CITRACAL PO) Take 2 tablets by mouth daily. 500 mg Calcium & 1000 units of Vitamin D     chlorpheniramine (CHLOR-TRIMETON) 4 MG tablet Take 4 mg by mouth every 6 (six) hours as needed for allergies.     ELIQUIS 5 MG TABS tablet TAKE ONE TABLET BY MOUTH TWICE DAILY 180 tablet 1   esomeprazole (NEXIUM) 20 MG capsule Take 20 mg by mouth daily.      metoprolol succinate (TOPROL-XL) 100 MG 24 hr tablet TAKE 1 TABLET ONCE DAILY. 90 tablet 1   No facility-administered medications prior to visit.    PAST MEDICAL HISTORY: Past Medical History:  Diagnosis Date   Allergy    Arthritis    hands   Asthma    rarely uses inhaler   Atrial fibrillation (HCC)    Cataracts, bilateral    Chicken pox    Endometriosis    Esophageal dysfunction    GERD (gastroesophageal reflux disease)    Glaucoma    surg for preventive for NARROW ANGLE   Hematuria    Hx of colonic polyp 06-12-2003   Colonoscopy Dr Amedeo Plenty   Hyperlipemia    Hypertension    Lichen planus    Osteopenia 05/2013   T score -2.3 FRAX 13%/2.7%   Sleep apnea    Borderline.   Did not require sleeping.     SVT (supraventricular tachycardia) (HCC)    Thyroid disease    nodule on thyroid   Tubular adenoma of colon     PAST SURGICAL HISTORY: Past Surgical History:  Procedure Laterality Date   APPENDECTOMY     BREAST CYST EXCISION  AGE 57, 23, 74   bilateral   BREAST EXCISIONAL BIOPSY Bilateral    CATARACT EXTRACTION     CATARACT EXTRACTION     x2    COLONOSCOPY     DILATATION & CURETTAGE/HYSTEROSCOPY WITH TRUECLEAR N/A 02/22/2014   Procedure: DILATATION & CURETTAGE/HYSTEROSCOPY WITH TRUCLEAR;  Surgeon: [name], MD;  Location: [facility];  Service: Gynecology;  Laterality: N/A;   ESOPHAGEAL MANOMETRY N/A 11/29/2012   Procedure: ESOPHAGEAL MANOMETRY (EM);  Surgeon: Sable Feil, MD;  Location: [facility];  Service: Endoscopy;  Laterality: N/A;   EYE SURGERY  2010   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  LAPAROSCOPIC ENDOMETRIOSIS FULGURATION  1972   LEFT HEART CATH AND CORONARY ANGIOGRAPHY N/A 11/26/2021   Procedure: LEFT HEART CATH AND CORONARY ANGIOGRAPHY;  Surgeon: Troy Sine, MD;  Location: [facility];  Service: Cardiovascular;  Laterality: N/A;   OOPHORECTOMY  1969   RSO   POLYPECTOMY     THYROIDECTOMY, PARTIAL  AGE 11   TONSILLECTOMY     UPPER GASTROINTESTINAL ENDOSCOPY  2013    FAMILY HISTORY: Family History  Problem Relation Age of Onset   Colon cancer Mother 16   Alcohol abuse Mother    Mental illness Mother    Lung cancer Father        smoker   Alcohol abuse Father    Hypertension Father    Colon cancer Father 62   Breast cancer Maternal Aunt        Age 62   Heart attack Paternal Grandmother    Other Cousin        mother side-vocal cord cancer   Breast cancer Cousin 52    SOCIAL HISTORY: Social History   Socioeconomic History   Marital status: Married    Spouse name: Not on file   Number of children: 2   Years of education: Not on file   Highest education level: Not on file  Occupational History   Occupation: attorney    Employer: [employer]  Tobacco Use   Smoking status: Former    Packs/day: 0.30    Years: 25.00    Pack years: 7.50    Types: Cigarettes    Quit date: 09/14/1984    Years since quitting: 37.5   Smokeless tobacco: Never  Vaping Use   Vaping Use: Never used  Substance and Sexual Activity   Alcohol use: Yes    Comment: once a week   Drug  use: No   Sexual activity: Not Currently    Birth control/protection: Post-menopausal    Comment: 1st intercourse 76 yo-Fewer than 5 partners  Other Topics Concern   Not on file  Social History Narrative   MARRIED; 2 CHILDREN   LAW DEGREE; ATTORNEY   Social Determinants of Health   Financial Resource Strain: Not on file  Food Insecurity: Not on file  Transportation Needs: Not on file  Physical Activity: Not on file  Stress: Not on file  Social Connections: Not on file  Intimate Partner Violence: Not on file     PHYSICAL EXAM  There were no vitals filed for this visit.  There is no height or weight on file to calculate BMI.  Generalized: Well developed, in no acute distress  Cardiology: normal rate and rhythm, no murmur noted Respiratory: clear to auscultation bilaterally  Neurological examination  Mentation: Alert oriented to time, place, history taking. Follows all commands speech and language fluent Cranial nerve II-XII: Pupils were equal round reactive to light. Extraocular movements were full, visual field were full  Motor: The motor testing reveals 5 over 5 strength of all 4 extremities. Good symmetric motor tone is noted throughout.  Gait and station: Gait is normal.    DIAGNOSTIC DATA (LABS, IMAGING, TESTING) - I reviewed patient records, labs, notes, testing and imaging myself where available.      View : No data to display.           Lab Results  Component Value Date   WBC 5.0 11/21/2021   HGB 12.3 11/21/2021   HCT 36.8 11/21/2021   MCV 87 11/21/2021   PLT 189 11/21/2021  Component Value Date/Time   NA 139 11/21/2021 0910   K 4.8 11/21/2021 0910   CL 102 11/21/2021 0910   CO2 27 11/21/2021 0910   GLUCOSE 70 11/21/2021 0910   GLUCOSE 124 (H) 09/10/2020 0153   BUN 23 11/21/2021 0910   CREATININE 0.93 11/21/2021 0910   CREATININE 0.87 12/30/2013 0924   CALCIUM 9.3 11/21/2021 0910   PROT 7.0 07/25/2019 1512   ALBUMIN 4.2 07/25/2019 1512    AST 16 07/25/2019 1512   ALT 13 07/25/2019 1512   ALKPHOS 52 07/25/2019 1512   BILITOT 0.3 07/25/2019 1512   GFRNONAA 57 (L) 09/10/2020 0153   GFRAA 56 (L) 06/29/2018 2130   Lab Results  Component Value Date   CHOL 168 01/28/2011   HDL 91.90 01/28/2011   LDLCALC 68 01/28/2011   TRIG 41.0 01/28/2011   CHOLHDL 2 01/28/2011   No results found for: HGBA1C No results found for: VITAMINB12 Lab Results  Component Value Date   TSH 1.257 06/29/2018     ASSESSMENT AND PLAN 76 y.o. year old female  has a past medical history of Allergy, Arthritis, Asthma, Atrial fibrillation ([place]), Cataracts, bilateral, Chicken pox, Endometriosis, Esophageal dysfunction, GERD (gastroesophageal reflux disease), Glaucoma, Hematuria, colonic polyp (06-12-2003), Hyperlipemia, Hypertension, Lichen planus, Osteopenia (05/2013), Sleep apnea, SVT (supraventricular tachycardia) ([place]), Thyroid disease, and Tubular adenoma of colon. here with   No diagnosis found.   Meagan Key is doing well on CPAP therapy. Compliance report reveals excellent compliance. She has noted more concerns of dry eyes. Leak averaging 23.8l/min and as high as 30l/min. She will monitor leak at home. I will send orders for mask refitting and evaluation with DME. Risks of untreated sleep apnea review and education materials provided. Healthy lifestyle habits encouraged. She will follow up in 1 year, sooner if needed. She verbalizes understanding and agreement with this plan.    No orders of the defined types were placed in this encounter.    No orders of the defined types were placed in this encounter.     Debbora Presto, FNP-C 03/18/2022, 4:47 PM [facility] [address], [place],  [address] [phone]

## 2022-03-18 NOTE — Patient Instructions (Incomplete)
Please continue using your CPAP regularly. While your insurance requires that you use CPAP at least 4 hours each night on 70% of the nights, I recommend, that you not skip any nights and use it throughout the night if you can. Getting used to CPAP and staying with the treatment long term does take time and patience and discipline. Untreated obstructive sleep apnea when it is moderate to severe can have an adverse impact on cardiovascular health and raise her risk for heart disease, arrhythmias, hypertension, congestive heart failure, stroke and diabetes. Untreated obstructive sleep apnea causes sleep disruption, nonrestorative sleep, and sleep deprivation. This can have an impact on your day to day functioning and cause daytime sleepiness and impairment of cognitive function, memory loss, mood disturbance, and problems focussing. Using CPAP regularly can improve these symptoms.  I will send an order to the DME to assess mask fit and elevated leak. We would like to leak to remain lower than 20 liters per minute. Your leak is around 23-30 liters per minute. Hopefully changing your mask will help. Continue eye drops. Continue close follow up with PCP and ophthalmology.   Consider extended release melatonin if needed for sleep aid. You can get this over the counter.   Follow up with me in 1 year, sooner if needed.

## 2022-03-18 NOTE — Telephone Encounter (Signed)
Called the patient back. She states over last 6 months or so she has been having problems with dry eyes. I advised at looking at her DL there are some nights where she appears to be having mask leaks which could contribute to air going into eyes. Pt states that the messages from the machine which indicate the mask is fitting properly. Her humidification level is set on auto and automatically adjust to the temperature in the room. She is using genteal eye drops. I was able to offer a sooner apt for the patient to address.

## 2022-03-18 NOTE — Telephone Encounter (Signed)
Pt said having dry eye from using the CPAP machine. Would like a call from the nurse.

## 2022-03-19 ENCOUNTER — Ambulatory Visit (INDEPENDENT_AMBULATORY_CARE_PROVIDER_SITE_OTHER): Payer: Medicare Other | Admitting: Family Medicine

## 2022-03-19 ENCOUNTER — Encounter: Payer: Self-pay | Admitting: Family Medicine

## 2022-03-19 VITALS — BP 121/73 | HR 67 | Ht 61.0 in | Wt 151.0 lb

## 2022-03-19 DIAGNOSIS — G4733 Obstructive sleep apnea (adult) (pediatric): Secondary | ICD-10-CM | POA: Diagnosis not present

## 2022-03-19 DIAGNOSIS — Z9989 Dependence on other enabling machines and devices: Secondary | ICD-10-CM | POA: Diagnosis not present

## 2022-03-19 DIAGNOSIS — H04129 Dry eye syndrome of unspecified lacrimal gland: Secondary | ICD-10-CM | POA: Diagnosis not present

## 2022-03-19 DIAGNOSIS — G4719 Other hypersomnia: Secondary | ICD-10-CM

## 2022-03-19 NOTE — Progress Notes (Signed)
CM sent to [facility] for new order ?

## 2022-03-20 ENCOUNTER — Other Ambulatory Visit: Payer: Self-pay | Admitting: Internal Medicine

## 2022-03-20 DIAGNOSIS — R928 Other abnormal and inconclusive findings on diagnostic imaging of breast: Secondary | ICD-10-CM

## 2022-03-25 ENCOUNTER — Ambulatory Visit: Payer: Medicare Other

## 2022-03-25 ENCOUNTER — Ambulatory Visit
Admission: RE | Admit: 2022-03-25 | Discharge: 2022-03-25 | Disposition: A | Discharge status: Home / Self Care | Payer: Medicare Other | Source: Ambulatory Visit | Attending: Internal Medicine | Admitting: Internal Medicine

## 2022-03-25 DIAGNOSIS — R928 Other abnormal and inconclusive findings on diagnostic imaging of breast: Secondary | ICD-10-CM

## 2022-03-25 DIAGNOSIS — R922 Inconclusive mammogram: Secondary | ICD-10-CM | POA: Diagnosis not present

## 2022-04-14 ENCOUNTER — Other Ambulatory Visit: Payer: Self-pay | Admitting: Cardiology

## 2022-04-16 ENCOUNTER — Other Ambulatory Visit: Payer: Self-pay | Admitting: Cardiology

## 2022-04-16 NOTE — Telephone Encounter (Signed)
Eliquis '5mg'$  refill request received. Patient is 76 years old, weight-68.5kg, Crea-0.93 on 11/21/2021, Diagnosis-Afib, and last seen by Dr. Percival Spanish on 02/14/2022. Dose is appropriate based on dosing criteria. Will send in refill to requested pharmacy.    Will send Norvasc refill to refill dept.

## 2022-05-05 ENCOUNTER — Ambulatory Visit: Payer: Medicare Other | Admitting: Obstetrics and Gynecology

## 2022-05-15 DIAGNOSIS — L438 Other lichen planus: Secondary | ICD-10-CM | POA: Diagnosis not present

## 2022-06-24 DIAGNOSIS — Z23 Encounter for immunization: Secondary | ICD-10-CM | POA: Diagnosis not present

## 2022-07-08 DIAGNOSIS — Z23 Encounter for immunization: Secondary | ICD-10-CM | POA: Diagnosis not present

## 2022-07-11 ENCOUNTER — Telehealth: Payer: Self-pay | Admitting: Family Medicine

## 2022-07-11 NOTE — Telephone Encounter (Addendum)
Pt and her husband will be traveling internationally and they are needing a note stating that the Cpap machine is medically needed. Pt would like to know if they can get this letter and have two copies of it. Pt and her husband will be leaving on Oct the 3rd Please advise.

## 2022-07-14 ENCOUNTER — Encounter: Payer: Self-pay | Admitting: *Deleted

## 2022-07-14 NOTE — Telephone Encounter (Signed)
Called pt back. She will pick up letter this afternoon.  She also asked for letter for husband. She will pick up letter for husband as well.

## 2022-09-22 DIAGNOSIS — E559 Vitamin D deficiency, unspecified: Secondary | ICD-10-CM | POA: Diagnosis not present

## 2022-09-22 DIAGNOSIS — E049 Nontoxic goiter, unspecified: Secondary | ICD-10-CM | POA: Diagnosis not present

## 2022-09-22 DIAGNOSIS — R7989 Other specified abnormal findings of blood chemistry: Secondary | ICD-10-CM | POA: Diagnosis not present

## 2022-09-22 DIAGNOSIS — E785 Hyperlipidemia, unspecified: Secondary | ICD-10-CM | POA: Diagnosis not present

## 2022-09-22 DIAGNOSIS — I10 Essential (primary) hypertension: Secondary | ICD-10-CM | POA: Diagnosis not present

## 2022-09-25 NOTE — Progress Notes (Signed)
76 y.o. G40P2002 Married Caucasian female here for breast and pelvic exam.  She is followed for lichenoid inflammation, lichen planus of the vulva.   She was referred to Dr. Lamount Cohen, and did not have a consultation at [facility].   She has a dermatologist, Dr. Baxter Kail at [facility] for lichen planus of the mouth. She will also care for her lichen planus of the vulva.   She has no current irritation.  States she did not get the prescription for Clobetasol. She has an allergy potentially to cortisone.  She is using triamcinolone for oral lichen planus, and she is doing well.   Stopped vaginal estradiol 1 to 1.5 years ago.   No vaginal bleeding.  No UTIs.   PCP:   [name], MD  No LMP recorded. Patient is postmenopausal.           Sexually active: No.  The current method of family planning is post menopausal status.    Exercising: No.     Smoker:  no, former  Health Maintenance: Pap:  09/23/18 negative,  09/15/16- WNL, 09/12/15- WNL History of abnormal Pap:  no MMG:  03/25/22, Breast Density Category C, BI-RADS CATEGORY 1 Negative Colonoscopy:  2021- WNL per pt BMD: 2022 per pt (Dr. Virgina Jock) 06/15/13    Result  osteopenia.  On Fosamax.  TDaP:  02/17/13 Gardasil:   no HIV: never Hep C: never Screening Labs: PCP   reports that she quit smoking about 38 years ago. Her smoking use included cigarettes. She has a 7.50 pack-year smoking history. She has never used smokeless tobacco. She reports current alcohol use. She reports that she does not use drugs.  Past Medical History:  Diagnosis Date   Allergy    Arthritis    hands   Asthma    rarely uses inhaler   Atrial fibrillation (HCC)    Cataracts, bilateral    Chicken pox    Endometriosis    Esophageal dysfunction    GERD (gastroesophageal reflux disease)    Glaucoma    surg for preventive for NARROW ANGLE   Hematuria    Hx of colonic polyp 06-12-2003   Colonoscopy Dr Amedeo Plenty   Hyperlipemia    Hypertension    Lichen planus     Osteopenia 05/2013   T score -2.3 FRAX 13%/2.7%   Sleep apnea    Borderline.   Did not require sleeping.     SVT (supraventricular tachycardia)    Thyroid disease    nodule on thyroid   Tubular adenoma of colon     Past Surgical History:  Procedure Laterality Date   APPENDECTOMY     BREAST CYST EXCISION  AGE 89, 23, 56   bilateral   BREAST EXCISIONAL BIOPSY Bilateral    CATARACT EXTRACTION     CATARACT EXTRACTION     x2   COLONOSCOPY     DILATATION & CURETTAGE/HYSTEROSCOPY WITH TRUECLEAR N/A 02/22/2014   Procedure: DILATATION & CURETTAGE/HYSTEROSCOPY WITH TRUCLEAR;  Surgeon: [name], MD;  Location: [facility];  Service: Gynecology;  Laterality: N/A;   ESOPHAGEAL MANOMETRY N/A 11/29/2012   Procedure: ESOPHAGEAL MANOMETRY (EM);  Surgeon: Sable Feil, MD;  Location: [facility];  Service: Endoscopy;  Laterality: N/A;   EYE SURGERY  2010   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   LAPAROSCOPIC ENDOMETRIOSIS FULGURATION  1972   LEFT HEART CATH AND CORONARY ANGIOGRAPHY N/A 11/26/2021   Procedure: LEFT HEART CATH AND CORONARY ANGIOGRAPHY;  Surgeon: Troy Sine, MD;  Location: [facility];  Service: Cardiovascular;  Laterality: N/A;   OOPHORECTOMY  1969   RSO   POLYPECTOMY     THYROIDECTOMY, PARTIAL  AGE 41   TONSILLECTOMY     UPPER GASTROINTESTINAL ENDOSCOPY  2013    Current Outpatient Medications  Medication Sig Dispense Refill   acetaminophen (TYLENOL) 500 MG tablet Take 500 mg by mouth every 8 (eight) hours as needed for moderate pain.     albuterol (VENTOLIN HFA) 108 (90 Base) MCG/ACT inhaler Inhale 2 puffs into the lungs every 6 (six) hours as needed for wheezing or shortness of breath.     alendronate (FOSAMAX) 70 MG tablet Take 70 mg by mouth every Wednesday. Every other week now     amLODipine (NORVASC) 5 MG tablet TAKE ONE TABLET BY MOUTH DAILY 90 tablet 3   apixaban (ELIQUIS) 5 MG TABS tablet TAKE ONE TABLET BY MOUTH TWICE DAILY 180 tablet 2    Artificial Tear Solution (GENTEAL TEARS) 0.1-0.2-0.3 % SOLN Place 1 drop into both eyes daily as needed (dry eyes).     Calcium Citrate (CITRACAL PO) Take 2 tablets by mouth daily. 500 mg Calcium & 1000 units of Vitamin D     chlorpheniramine (CHLOR-TRIMETON) 4 MG tablet Take 4 mg by mouth every 6 (six) hours as needed for allergies.     esomeprazole (NEXIUM) 20 MG capsule Take 20 mg by mouth daily.      metoprolol succinate (TOPROL-XL) 100 MG 24 hr tablet TAKE 1 TABLET ONCE DAILY. 90 tablet 3   metroNIDAZOLE (FLAGYL PO) Take by mouth as needed (for mouth inflammation).     nystatin (MYCOSTATIN) 100000 UNIT/ML suspension Take 5 mLs by mouth as needed (w/ flagyl).     Pravastatin Sodium (PRAVACHOL PO) Take by mouth.     triamcinolone (KENALOG) 0.1 % paste Place onto teeth.     No current facility-administered medications for this visit.    Family History  Problem Relation Age of Onset   Colon cancer Mother 54   Alcohol abuse Mother    Mental illness Mother    Lung cancer Father        smoker   Alcohol abuse Father    Hypertension Father    Colon cancer Father 54   Breast cancer Maternal Aunt        Age 76   Heart attack Paternal Grandmother    Other Cousin        mother side-vocal cord cancer   Breast cancer Cousin 18    Review of Systems  All other systems reviewed and are negative.   Exam:   BP 122/80 (BP Location: Left Arm, Patient Position: Sitting, Cuff Size: Normal)   Pulse 61   Ht '5\' 1"'$  (1.549 m)   Wt 149 lb (67.6 kg)   SpO2 97%   BMI 28.15 kg/m     General appearance: alert, cooperative and appears stated age Head: normocephalic, without obvious abnormality, atraumatic Neck: no adenopathy, supple, symmetrical, trachea midline and thyroid normal to inspection and palpation Lungs: clear to auscultation bilaterally Breasts: normal appearance, no masses or tenderness, No nipple retraction or dimpling, No nipple discharge or bleeding, No axillary adenopathy Heart:  regular rate and rhythm Abdomen: soft, non-tender; no masses, no organomegaly Extremities: extremities normal, atraumatic, no cyanosis or edema Skin: skin color, texture, turgor normal. No rashes or lesions Lymph nodes: cervical, supraclavicular, and axillary nodes normal. Neurologic: grossly normal  Pelvic: External genitalia:  painful beefy red lesion of the right  introitus.               No abnormal inguinal nodes palpated.              Urethra:  normal appearing urethra with no masses, tenderness or lesions              Bartholins and Skenes: normal                 Vagina: normal appearing vagina with normal color and discharge, no lesions              Cervix: no lesions              Pap taken: yes. Bimanual Exam:  Uterus:  normal size, contour, position, consistency, mobility, non-tender              Adnexa: no mass, fullness, tenderness              Rectal exam: yes.  Confirms.              Anus:  normal sphincter tone, no lesions  Chaperone was present for exam:  Emily  Assessment:   Encounter for breast and pelvic exam.  Lichenoid inflammation of vulva, consistent with lichen planus.  Hx atrial fibrillation.  On Eliquis.  Allergy to steroid.  Tolerating oral triamcinolone.   Plan: Mammogram screening discussed. Self breast awareness reviewed. Pap and HR HPV as above. Guidelines for Calcium, Vitamin D, regular exercise program including cardiovascular and weight bearing exercise. We discussed lichen planus. Triamcinolone ointment to vulva prn.  She will see dermatology regarding her lichen planus of the vulva.  Follow up annually and prn.   After visit summary provided.   30 min  total time was spent for this patient encounter, including preparation, face-to-face counseling with the patient, coordination of care, and documentation of the encounter in addition to doing breast and pelvic exam.    .

## 2022-09-29 DIAGNOSIS — H40023 Open angle with borderline findings, high risk, bilateral: Secondary | ICD-10-CM | POA: Diagnosis not present

## 2022-09-29 DIAGNOSIS — H268 Other specified cataract: Secondary | ICD-10-CM | POA: Diagnosis not present

## 2022-09-30 DIAGNOSIS — R82998 Other abnormal findings in urine: Secondary | ICD-10-CM | POA: Diagnosis not present

## 2022-09-30 DIAGNOSIS — I10 Essential (primary) hypertension: Secondary | ICD-10-CM | POA: Diagnosis not present

## 2022-10-02 DIAGNOSIS — I77819 Aortic ectasia, unspecified site: Secondary | ICD-10-CM | POA: Diagnosis not present

## 2022-10-02 DIAGNOSIS — Z1331 Encounter for screening for depression: Secondary | ICD-10-CM | POA: Diagnosis not present

## 2022-10-02 DIAGNOSIS — G4733 Obstructive sleep apnea (adult) (pediatric): Secondary | ICD-10-CM | POA: Diagnosis not present

## 2022-10-02 DIAGNOSIS — E785 Hyperlipidemia, unspecified: Secondary | ICD-10-CM | POA: Diagnosis not present

## 2022-10-02 DIAGNOSIS — I10 Essential (primary) hypertension: Secondary | ICD-10-CM | POA: Diagnosis not present

## 2022-10-02 DIAGNOSIS — Z7901 Long term (current) use of anticoagulants: Secondary | ICD-10-CM | POA: Diagnosis not present

## 2022-10-02 DIAGNOSIS — M8580 Other specified disorders of bone density and structure, unspecified site: Secondary | ICD-10-CM | POA: Diagnosis not present

## 2022-10-02 DIAGNOSIS — I2721 Secondary pulmonary arterial hypertension: Secondary | ICD-10-CM | POA: Diagnosis not present

## 2022-10-02 DIAGNOSIS — Z1389 Encounter for screening for other disorder: Secondary | ICD-10-CM | POA: Diagnosis not present

## 2022-10-02 DIAGNOSIS — I480 Paroxysmal atrial fibrillation: Secondary | ICD-10-CM | POA: Diagnosis not present

## 2022-10-02 DIAGNOSIS — D472 Monoclonal gammopathy: Secondary | ICD-10-CM | POA: Diagnosis not present

## 2022-10-02 DIAGNOSIS — I700 Atherosclerosis of aorta: Secondary | ICD-10-CM | POA: Diagnosis not present

## 2022-10-02 DIAGNOSIS — H409 Unspecified glaucoma: Secondary | ICD-10-CM | POA: Diagnosis not present

## 2022-10-02 DIAGNOSIS — Z0000 Encounter for general adult medical examination without abnormal findings: Secondary | ICD-10-CM | POA: Diagnosis not present

## 2022-10-07 DIAGNOSIS — Z1212 Encounter for screening for malignant neoplasm of rectum: Secondary | ICD-10-CM | POA: Diagnosis not present

## 2022-10-08 ENCOUNTER — Ambulatory Visit (INDEPENDENT_AMBULATORY_CARE_PROVIDER_SITE_OTHER): Payer: Medicare Other | Admitting: Obstetrics and Gynecology

## 2022-10-08 ENCOUNTER — Other Ambulatory Visit (HOSPITAL_COMMUNITY)
Admission: RE | Admit: 2022-10-08 | Discharge: 2022-10-08 | Disposition: A | Discharge status: Home / Self Care | Payer: Medicare Other | Source: Ambulatory Visit | Attending: Obstetrics and Gynecology | Admitting: Obstetrics and Gynecology

## 2022-10-08 ENCOUNTER — Encounter: Payer: Self-pay | Admitting: Obstetrics and Gynecology

## 2022-10-08 VITALS — BP 122/80 | HR 61 | Ht 61.0 in | Wt 149.0 lb

## 2022-10-08 DIAGNOSIS — Z124 Encounter for screening for malignant neoplasm of cervix: Secondary | ICD-10-CM | POA: Insufficient documentation

## 2022-10-08 DIAGNOSIS — Z01419 Encounter for gynecological examination (general) (routine) without abnormal findings: Secondary | ICD-10-CM | POA: Diagnosis not present

## 2022-10-08 DIAGNOSIS — L439 Lichen planus, unspecified: Secondary | ICD-10-CM | POA: Diagnosis not present

## 2022-10-08 DIAGNOSIS — Z1151 Encounter for screening for human papillomavirus (HPV): Secondary | ICD-10-CM | POA: Insufficient documentation

## 2022-10-08 DIAGNOSIS — I2089 Other forms of angina pectoris: Secondary | ICD-10-CM

## 2022-10-08 DIAGNOSIS — Z872 Personal history of diseases of the skin and subcutaneous tissue: Secondary | ICD-10-CM

## 2022-10-08 MED ORDER — TRIAMCINOLONE ACETONIDE 0.025 % EX OINT: 1.0000 | TOPICAL_OINTMENT | Freq: Two times a day (BID) | CUTANEOUS | 0 refills | Status: AC

## 2022-10-08 NOTE — Patient Instructions (Signed)
Lichen Planus Lichen planus is a skin problem that causes redness, itching, small bumps, and sores. It can affect the skin in any area of the body. Some common areas that are affected include: Arms, wrists, legs, or ankles. Chest, back, or abdomen. Genital areas. Gums and inside of the mouth. Scalp. Fingernails or toenails. Treatment can help control the symptoms of this condition. The condition can last for a long time. It can take 6-18 months or longer for it to go away. What are the causes? The exact cause of this condition is not known. The condition is not passed from one person to another (not contagious). It may be related to an allergy, a medicine, or an autoimmune response. An autoimmune response occurs when the body's defense system (immune system) mistakenly attacks healthy tissues. What increases the risk? The following factors may make you more likely to develop this condition: Being 30-60 years of age. Taking certain medicines. Having been exposed to certain dyes or chemicals. Having hepatitis C. What are the signs or symptoms? Symptoms of this condition may include: Itching, which can be severe. Small reddish or purple bumps on the skin. These may have flat tops and may be round or irregular shaped. Redness or white patches on the gums or tongue. Redness, soreness, or a burning feeling in the genital area. This may lead to pain or bleeding during sex. Changes in the fingernails or toenails. The nails may become thin or rough. They may have ridges in them. Redness or irritation of the eyes. This is rare. How is this diagnosed? This condition may be diagnosed based on: A physical exam. The health care provider will examine your affected skin and check for changes inside your mouth. Removal of a tissue sample to be looked at under a microscope (biopsy). How is this treated? Treatment for this condition may depend on the severity of symptoms. In some cases, no treatment is  needed. If treatment is needed to control symptoms, it may include: Creams or ointments (topical steroids) to help control itching and irritation. Medicine to be taken by mouth. Medicine to be taken by injection. A treatment in which your skin is exposed to ultraviolet light (phototherapy). Lozenges to suck on to help treat sores in the mouth. Follow these instructions at home:  Skin care Use creams or ointments as told by your health care provider. To soothe itching skin: Apply cool, wet cloths (cold compresses) to the affected area. Take cool baths. Add dry oatmeal to the water. Do not bathe in hot water. Do not scratch your skin. To help prevent scratching: Keep your fingernails short and clean. Cover the affected area with a bandage. Wash the affected area with mild soap and cool water as told by your health care provider. Keep the genital area as clean and dry as possible. General instructions Take or use over-the-counter and prescription medicines only as told by your health care provider. If you have sores in your mouth, avoid spicy and acidic foods as well as alcohol and tobacco. Keep all follow-up visits. Contact a health care provider if: You have increasing redness, swelling, or pain in the affected area. You have fluid, blood, or pus coming from the affected area. Your eyes become irritated. You have hair loss. Summary Lichen planus is a skin problem that causes redness, itching, small bumps, and sores. It can affect the skin in any area of the body. Do not scratch your skin. Apply cool, wet cloths (cold compresses) on the affected areas   or take cool baths to soothe itching. Take or use over-the-counter and prescription medicines only as told by your health care provider. Contact a health care provider if you have drainage from the affected area or have increasing redness, swelling, or pain in the area. Keep all follow-up visits. This information is not intended to  replace advice given to you by your health care provider. Make sure you discuss any questions you have with your health care provider. Document Revised: 12/04/2021 Document Reviewed: 12/04/2021 Elsevier Patient Education  2023 Elsevier Inc.  

## 2022-10-14 LAB — CYTOLOGY - PAP
Comment: NEGATIVE
Diagnosis: NEGATIVE
High risk HPV: NEGATIVE

## 2022-11-12 ENCOUNTER — Ambulatory Visit: Payer: Medicare Other | Admitting: Family Medicine

## 2022-11-25 NOTE — Progress Notes (Unsigned)
PATIENT: Meagan Key DOB: [birth year]  REASON FOR VISIT: follow up HISTORY FROM: patient  No chief complaint on file.    HISTORY OF PRESENT ILLNESS:  11/25/22 ALL:  Meagan Key returns for follow up for OSA on CPAP. She was last seen 02/2022 and complained of dry eyes. There was a large air leak and mask refitting orders placed.   She is followed by ophthalmology for possible open angle glaucoma. Last visit 09/2022 and advised monitoring. She is planning follow up 09/2023.   03/19/2022 ALL: Meagan Key returns for follow up for OSA on CPAP. She was seen 10/2021 and doing well. Leak was noted but improved with recheck in 12/2021. She called 03/18/2022 reporting more concerns of dry eyes. She is using a nasal pillow. She does not feel air leaking but reports that her husband will wake her up to fix her mask as he hears the air leaking. She reports that her eyes get very painful at times. Pain wraps across her face and temples. She is using eye drops that do help. She saw ophthalmology who told her she had dry eyes. She is not sure CPAP is working for her. She feels that she wakes frequently and often tosses and turns until she gets 4 hours of usage. She continues to have daytime sleepiness but reports that it is not bothersome for her.     11/07/2021 ALL: Meagan Key is a 77 y.o. female here today for follow up for OSA on CPAP.  She was last seen by Dr Rexene Alberts for initial CPAP visit in 03/2021. Compliance was excellent, however, she had a leak on the higher side of 34l/min. She is using a nasal pillow. She reports that it fits great if she sleeps on her back. If she is on her side, it leaks. She feels nasal pillow is very comfortable. She is feeling well. Sleeping well. She was diagnosed with Covid 12/21. She stopped using CPAP for about 3 weeks. She has recently resumed therapy and doing well.     HISTORY: (copied from Dr Meagan Key previous note)   [name] is a 77 year old right-handed woman with  an underlying medical history of MGUS, hypertension, hyperlipidemia, asthma, vitamin D deficiency, paroxysmal A. fib, glaucoma, reflux disease, osteopenia, SVT, and mildly overweight state, who presents for follow-up consultation of her obstructive sleep apnea after interim testing and starting AutoPap therapy.  The patient is unaccompanied today.  I first met her at the request of her primary care physician on 10/03/2020, at which time she reported snoring and excessive daytime somnolence.  She had a prior diagnosis of mild obstructive sleep apnea in 2014.  She was advised to proceed with sleep testing.  She had a home sleep test on 10/31/2020 which indicated severe obstructive sleep apnea with an AHI of 39.4/h, O2 nadir 79%.  She was advised to start AutoPap therapy at home.  Her set up date was 01/02/2021.   Today, 04/01/2021: I reviewed her AutoPap compliance data from 02/26/2021 through 03/27/2021, which is a total of 30 days, during which time she used her machine every night with percent use days greater than 4 hours at 97%, indicating excellent compliance with an average usage of 5 hours and 4 minutes, residual AHI 4/h, on the higher end of the goal, 95th percentile of pressure at 11.7 cm with a range of 7 to 12 cm with EPR, leak on the higher side with the 95th percentile at 34.4 L/min.  She reports doing fairly well,  she has adjusted to treatment, feels like it is somewhat helpful, she has been using the nasal cushion interface but has had some trouble getting a replacement every month and also a replacement of her filter.  She eventually received supplies after calling her DME company several times.  She is overall motivated to continue with treatment.  She does sleep about 7 to 8 hours on average and admits to taking off her mask once she has achieved more than 4 hours of usage as she does have some discomfort and air leaks from time to time and dislodging of the mask is cumbersome for her. She feels that  the seal is best when she sleeps on her back but she gets tired sleeping on her back.  Per husband's feedback, her snoring has improved, her nocturia is also better.   REVIEW OF SYSTEMS: Out of a complete 14 system review of symptoms, the patient complains only of the following symptoms, dry eyes, daytime sleepiness, and all other reviewed systems are negative.  ESS: 16/24   ALLERGIES: Allergies  Allergen Reactions   Nitrofurantoin Shortness Of Breath and Other (See Comments)    Itching in throat    Cortisone     A-fib   Lisinopril Itching and Other (See Comments)    SOB and itching is in throat   Symbicort [Budesonide-Formoterol Fumarate]     A-fib    HOME MEDICATIONS: Outpatient Medications Prior to Visit  Medication Sig Dispense Refill   acetaminophen (TYLENOL) 500 MG tablet Take 500 mg by mouth every 8 (eight) hours as needed for moderate pain.     albuterol (VENTOLIN HFA) 108 (90 Base) MCG/ACT inhaler Inhale 2 puffs into the lungs every 6 (six) hours as needed for wheezing or shortness of breath.     alendronate (FOSAMAX) 70 MG tablet Take 70 mg by mouth every Wednesday. Every other week now     amLODipine (NORVASC) 5 MG tablet TAKE ONE TABLET BY MOUTH DAILY 90 tablet 3   apixaban (ELIQUIS) 5 MG TABS tablet TAKE ONE TABLET BY MOUTH TWICE DAILY 180 tablet 2   Artificial Tear Solution (GENTEAL TEARS) 0.1-0.2-0.3 % SOLN Place 1 drop into both eyes daily as needed (dry eyes).     Calcium Citrate (CITRACAL PO) Take 2 tablets by mouth daily. 500 mg Calcium & 1000 units of Vitamin D     chlorpheniramine (CHLOR-TRIMETON) 4 MG tablet Take 4 mg by mouth every 6 (six) hours as needed for allergies.     esomeprazole (NEXIUM) 20 MG capsule Take 20 mg by mouth daily.      metoprolol succinate (TOPROL-XL) 100 MG 24 hr tablet TAKE 1 TABLET ONCE DAILY. 90 tablet 3   metroNIDAZOLE (FLAGYL PO) Take by mouth as needed (for mouth inflammation).     nystatin (MYCOSTATIN) 100000 UNIT/ML suspension  Take 5 mLs by mouth as needed (w/ flagyl).     Pravastatin Sodium (PRAVACHOL PO) Take by mouth.     triamcinolone (KENALOG) 0.025 % ointment Apply 1 Application topically 2 (two) times daily. Use as needed. 30 g 0   triamcinolone (KENALOG) 0.1 % paste Place onto teeth.     No facility-administered medications prior to visit.    PAST MEDICAL HISTORY: Past Medical History:  Diagnosis Date   Allergy    Arthritis    hands   Asthma    rarely uses inhaler   Atrial fibrillation (HCC)    Cataracts, bilateral    Chicken pox    Endometriosis  Esophageal dysfunction    GERD (gastroesophageal reflux disease)    Glaucoma    surg for preventive for NARROW ANGLE   Hematuria    Hx of colonic polyp 06-12-2003   Colonoscopy Dr Amedeo Plenty   Hyperlipemia    Hypertension    Lichen planus    Osteopenia 05/2013   T score -2.3 FRAX 13%/2.7%   Sleep apnea    Borderline.   Did not require sleeping.     SVT (supraventricular tachycardia)    Thyroid disease    nodule on thyroid   Tubular adenoma of colon     PAST SURGICAL HISTORY: Past Surgical History:  Procedure Laterality Date   APPENDECTOMY     BREAST CYST EXCISION  AGE 75, 23, 21   bilateral   BREAST EXCISIONAL BIOPSY Bilateral    CATARACT EXTRACTION     CATARACT EXTRACTION     x2   COLONOSCOPY     DILATATION & CURETTAGE/HYSTEROSCOPY WITH TRUECLEAR N/A 02/22/2014   Procedure: DILATATION & CURETTAGE/HYSTEROSCOPY WITH TRUCLEAR;  Surgeon: [name], MD;  Location: [facility];  Service: Gynecology;  Laterality: N/A;   ESOPHAGEAL MANOMETRY N/A 11/29/2012   Procedure: ESOPHAGEAL MANOMETRY (EM);  Surgeon: Sable Feil, MD;  Location: [facility];  Service: Endoscopy;  Laterality: N/A;   EYE SURGERY  2010   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   LAPAROSCOPIC ENDOMETRIOSIS FULGURATION  1972   LEFT HEART CATH AND CORONARY ANGIOGRAPHY N/A 11/26/2021   Procedure: LEFT HEART CATH AND CORONARY ANGIOGRAPHY;  Surgeon: Troy Sine, MD;  Location: [facility];  Service: Cardiovascular;  Laterality: N/A;   OOPHORECTOMY  1969   RSO   POLYPECTOMY     THYROIDECTOMY, PARTIAL  AGE 89   TONSILLECTOMY     UPPER GASTROINTESTINAL ENDOSCOPY  2013    FAMILY HISTORY: Family History  Problem Relation Age of Onset   Colon cancer Mother 106   Alcohol abuse Mother    Mental illness Mother    Lung cancer Father        smoker   Alcohol abuse Father    Hypertension Father    Colon cancer Father 58   Breast cancer Maternal Aunt        Age 34   Heart attack Paternal Grandmother    Other Cousin        mother side-vocal cord cancer   Breast cancer Cousin 39    SOCIAL HISTORY: Social History   Socioeconomic History   Marital status: Married    Spouse name: Not on file   Number of children: 2   Years of education: Not on file   Highest education level: Not on file  Occupational History   Occupation: attorney    Employer: [employer]  Tobacco Use   Smoking status: Former    Packs/day: 0.30    Years: 25.00    Total pack years: 7.50    Types: Cigarettes    Quit date: 09/14/1984    Years since quitting: 38.2   Smokeless tobacco: Never  Vaping Use   Vaping Use: Never used  Substance and Sexual Activity   Alcohol use: Yes    Comment: once a week   Drug use: No   Sexual activity: Not Currently    Birth control/protection: Post-menopausal    Comment: 1st intercourse 77 yo-Fewer than 5 partners  Other Topics Concern   Not on file  Social History Narrative   MARRIED; 2 CHILDREN   LAW DEGREE;  ATTORNEY   Social Determinants of Health   Financial Resource Strain: Not on file  Food Insecurity: Not on file  Transportation Needs: Not on file  Physical Activity: Not on file  Stress: Not on file  Social Connections: Not on file  Intimate Partner Violence: Not on file     PHYSICAL EXAM  There were no vitals filed for this visit.   There is no height or weight on file to calculate  BMI.  Generalized: Well developed, in no acute distress  Cardiology: normal rate and rhythm, no murmur noted Respiratory: clear to auscultation bilaterally  Neurological examination  Mentation: Alert oriented to time, place, history taking. Follows all commands speech and language fluent Cranial nerve II-XII: Pupils were equal round reactive to light. Extraocular movements were full, visual field were full  Motor: The motor testing reveals 5 over 5 strength of all 4 extremities. Good symmetric motor tone is noted throughout.  Gait and station: Gait is normal.  Eyes: no obvious erythema, edema or drainage from eyes.    DIAGNOSTIC DATA (LABS, IMAGING, TESTING) - I reviewed patient records, labs, notes, testing and imaging myself where available.      No data to display           Lab Results  Component Value Date   WBC 5.0 11/21/2021   HGB 12.3 11/21/2021   HCT 36.8 11/21/2021   MCV 87 11/21/2021   PLT 189 11/21/2021      Component Value Date/Time   NA 139 11/21/2021 0910   K 4.8 11/21/2021 0910   CL 102 11/21/2021 0910   CO2 27 11/21/2021 0910   GLUCOSE 70 11/21/2021 0910   GLUCOSE 124 (H) 09/10/2020 0153   BUN 23 11/21/2021 0910   CREATININE 0.93 11/21/2021 0910   CREATININE 0.87 12/30/2013 0924   CALCIUM 9.3 11/21/2021 0910   PROT 7.0 07/25/2019 1512   ALBUMIN 4.2 07/25/2019 1512   AST 16 07/25/2019 1512   ALT 13 07/25/2019 1512   ALKPHOS 52 07/25/2019 1512   BILITOT 0.3 07/25/2019 1512   GFRNONAA 57 (L) 09/10/2020 0153   GFRAA 56 (L) 06/29/2018 2130   Lab Results  Component Value Date   CHOL 168 01/28/2011   HDL 91.90 01/28/2011   LDLCALC 68 01/28/2011   TRIG 41.0 01/28/2011   CHOLHDL 2 01/28/2011   No results found for: "HGBA1C" No results found for: "VITAMINB12" Lab Results  Component Value Date   TSH 1.257 06/29/2018     ASSESSMENT AND PLAN 77 y.o. year old female  has a past medical history of Allergy, Arthritis, Asthma, Atrial fibrillation  ([place]), Cataracts, bilateral, Chicken pox, Endometriosis, Esophageal dysfunction, GERD (gastroesophageal reflux disease), Glaucoma, Hematuria, colonic polyp (06-12-2003), Hyperlipemia, Hypertension, Lichen planus, Osteopenia (05/2013), Sleep apnea, SVT (supraventricular tachycardia), Thyroid disease, and Tubular adenoma of colon. here with   No diagnosis found.   GABRIAL POPPELL continues CPAP therapy but uncertain if it is helping her. She continues to have daytime sleepiness but reports not being bothered by this. She feels that she gets 3-5 hours of restful sleep and then tosses and turns for a few hours every night. She is not interested in taking sleep aids. Advised she could try melatonin OTC. She reports dry eyes started 7 months ago. Ophthalmology exam unremarkable. She has been using artifical tears that do help. Leak averaging 23.8l/min and as high as 30l/min. She will monitor leak at home. I will send orders for mask refitting and evaluation with DME. Compliance report  reveals excellent compliance. AHI is very well managed. She was encouraged to continue CPAP nightly for at least 4 hours. Risks of untreated sleep apnea review and education materials provided. Healthy lifestyle habits encouraged. She will follow up in 1 year, sooner if needed. She verbalizes understanding and agreement with this plan.    No orders of the defined types were placed in this encounter.    No orders of the defined types were placed in this encounter.    I spent 30 minutes of face-to-face and non-face-to-face time with patient.  This included previsit chart review, lab review, study review, order entry, electronic health record documentation, patient education.    Debbora Presto, FNP-C 11/25/2022, 9:29 AM [facility] [address], [place], [place] [address] [phone]

## 2022-11-25 NOTE — Patient Instructions (Signed)

## 2022-11-26 ENCOUNTER — Encounter: Payer: Self-pay | Admitting: Family Medicine

## 2022-11-26 ENCOUNTER — Ambulatory Visit (INDEPENDENT_AMBULATORY_CARE_PROVIDER_SITE_OTHER): Payer: Medicare Other | Admitting: Family Medicine

## 2022-11-26 VITALS — BP 105/71 | HR 65 | Ht 62.0 in | Wt 150.0 lb

## 2022-11-26 DIAGNOSIS — G4733 Obstructive sleep apnea (adult) (pediatric): Secondary | ICD-10-CM | POA: Diagnosis not present

## 2022-12-11 DIAGNOSIS — L438 Other lichen planus: Secondary | ICD-10-CM | POA: Diagnosis not present

## 2022-12-15 DIAGNOSIS — I480 Paroxysmal atrial fibrillation: Secondary | ICD-10-CM | POA: Diagnosis not present

## 2022-12-15 DIAGNOSIS — I700 Atherosclerosis of aorta: Secondary | ICD-10-CM | POA: Diagnosis not present

## 2022-12-15 DIAGNOSIS — I2721 Secondary pulmonary arterial hypertension: Secondary | ICD-10-CM | POA: Diagnosis not present

## 2022-12-15 DIAGNOSIS — R42 Dizziness and giddiness: Secondary | ICD-10-CM | POA: Diagnosis not present

## 2022-12-15 DIAGNOSIS — I10 Essential (primary) hypertension: Secondary | ICD-10-CM | POA: Diagnosis not present

## 2022-12-15 DIAGNOSIS — D472 Monoclonal gammopathy: Secondary | ICD-10-CM | POA: Diagnosis not present

## 2022-12-15 DIAGNOSIS — D6869 Other thrombophilia: Secondary | ICD-10-CM | POA: Diagnosis not present

## 2022-12-15 DIAGNOSIS — Z7901 Long term (current) use of anticoagulants: Secondary | ICD-10-CM | POA: Diagnosis not present

## 2022-12-15 DIAGNOSIS — G4733 Obstructive sleep apnea (adult) (pediatric): Secondary | ICD-10-CM | POA: Diagnosis not present

## 2023-01-14 ENCOUNTER — Other Ambulatory Visit: Payer: Self-pay | Admitting: Cardiology

## 2023-01-14 NOTE — Telephone Encounter (Signed)
Prescription refill request for Eliquis received. Indication: PAF Last office visit: 02/14/22  Vita Barley MD Scr: 0.93 on 11/21/21 Age: 77 Weight: 67.6kg  Based on above findings Eliquis 5mg  twice daily is the appropriate dose.  Refill approved.  Message sent to schedulers to make recall appt due in April with labs at that time.

## 2023-02-09 ENCOUNTER — Other Ambulatory Visit: Payer: Self-pay | Admitting: Internal Medicine

## 2023-02-09 DIAGNOSIS — Z1231 Encounter for screening mammogram for malignant neoplasm of breast: Secondary | ICD-10-CM

## 2023-03-01 NOTE — Progress Notes (Unsigned)
  Cardiology Office Note:   Date:  03/04/2023  ID:  [name], DOB [birth year], MRN [id number]  History of Present Illness:   Meagan Key is a 77 y.o. female who presents for followup of atrial fib.  She had atrial fib with RVR in Sept and was in the ED.  She converted spontaneously.  She was followed in the atrial fib clinic.    She was started on Eliquis.  She did have some bleeding gums.  Of note she did have chest pain during her RVR.  She had a low risk POET (Plain Old Exercise Treadmill).    She had chest pain and she had a cardiac cath.   She had normal coronary arteries.   Since I last saw her she has done OK. The patient denies any new symptoms such as chest discomfort, neck or arm discomfort. There has been no new shortness of breath, PND or orthopnea. There have been no reported palpitations, presyncope or syncope.   She exercises routinely.   ROS: As stated in the HPI and negative for all other systems.  Studies Reviewed:    EKG: Normal sinus rhythm, rate 57, axis of limits, intervals within normal limits, no acute ST-T wave changes.   Risk Assessment/Calculations:    CHA2DS2-VASc Score = 4  { This indicates a 4.8% annual risk of stroke. The patient's score is based upon:       Physical Exam:   VS:  BP (!) 140/88 (BP Location: Left Arm, Patient Position: Sitting, Cuff Size: Normal)   Pulse (!) 57   Ht 5\' 2"  (1.575 m)   Wt 151 lb 9.6 oz (68.8 kg)   SpO2 97%   BMI 27.73 kg/m    Wt Readings from Last 3 Encounters:  03/04/23 151 lb 9.6 oz (68.8 kg)  11/26/22 150 lb (68 kg)  10/08/22 149 lb (67.6 kg)     GEN: Well nourished, well developed in no acute distress NECK: No JVD; No carotid bruits CARDIAC: RRR, no murmurs, rubs, gallops RESPIRATORY:  Clear to auscultation without rales, wheezing or rhonchi  ABDOMEN: Soft, non-tender, non-distended EXTREMITIES:  Trace leg edema; No deformity   ASSESSMENT AND PLAN:   ATRIAL FIB:  Ms. [name] has a CHA2DS2 -  VASc score of 4.  She has not had any symptomatic paroxysms.  No change in therapy.   SLEEP APNEA:    She uses CPAP for 8 hours per night.  No change in therapy.   HTN:    The blood pressure is at target.  No change in therapy.    Signed, Rollene Rotunda, MD

## 2023-03-04 ENCOUNTER — Ambulatory Visit: Payer: Medicare Other | Attending: Cardiology | Admitting: Cardiology

## 2023-03-04 ENCOUNTER — Encounter: Payer: Self-pay | Admitting: Cardiology

## 2023-03-04 VITALS — BP 140/88 | HR 57 | Ht 62.0 in | Wt 151.6 lb

## 2023-03-04 DIAGNOSIS — I480 Paroxysmal atrial fibrillation: Secondary | ICD-10-CM | POA: Diagnosis not present

## 2023-03-04 DIAGNOSIS — I10 Essential (primary) hypertension: Secondary | ICD-10-CM | POA: Insufficient documentation

## 2023-03-04 DIAGNOSIS — R079 Chest pain, unspecified: Secondary | ICD-10-CM | POA: Insufficient documentation

## 2023-03-04 DIAGNOSIS — E785 Hyperlipidemia, unspecified: Secondary | ICD-10-CM | POA: Diagnosis not present

## 2023-03-04 NOTE — Patient Instructions (Signed)
Medication Instructions:  Continue same medications *If you need a refill on your cardiac medications before your next appointment, please call your pharmacy*   Lab Work: None ordered   Testing/Procedures: None ordered   Follow-Up: At [facility], you and your health needs are our priority.  As part of our continuing mission to provide you with exceptional heart care, we have created designated Provider Care Teams.  These Care Teams include your primary Cardiologist (physician) and Advanced Practice Providers (APPs -  Physician Assistants and Nurse Practitioners) who all work together to provide you with the care you need, when you need it.  We recommend signing up for the patient portal called "MyChart".  Sign up information is provided on this After Visit Summary.  MyChart is used to connect with patients for Virtual Visits (Telemedicine).  Patients are able to view lab/test results, encounter notes, upcoming appointments, etc.  Non-urgent messages can be sent to your provider as well.   To learn more about what you can do with MyChart, go to [email or web address].    Your next appointment:  1 year    Call in Feb to schedule May appointment     Provider:  Dr.Hochrein   

## 2023-03-23 ENCOUNTER — Ambulatory Visit: Payer: Medicare Other | Admitting: Neurology

## 2023-03-26 ENCOUNTER — Ambulatory Visit: Payer: Medicare Other | Admitting: Family Medicine

## 2023-03-27 ENCOUNTER — Ambulatory Visit
Admission: RE | Admit: 2023-03-27 | Discharge: 2023-03-27 | Disposition: A | Discharge status: Home / Self Care | Payer: Medicare Other | Source: Ambulatory Visit | Attending: Internal Medicine | Admitting: Internal Medicine

## 2023-03-27 DIAGNOSIS — Z1231 Encounter for screening mammogram for malignant neoplasm of breast: Secondary | ICD-10-CM | POA: Diagnosis not present

## 2023-04-16 ENCOUNTER — Other Ambulatory Visit: Payer: Self-pay | Admitting: Cardiology

## 2023-04-20 ENCOUNTER — Other Ambulatory Visit: Payer: Self-pay | Admitting: Cardiology

## 2023-04-20 DIAGNOSIS — I482 Chronic atrial fibrillation, unspecified: Secondary | ICD-10-CM

## 2023-04-20 NOTE — Telephone Encounter (Signed)
Prescription refill request for Eliquis received. Indication: Afib  Last office visit: 03/04/23 (Hochrein)  Scr: 0.93 (11/21/21)  Age: 77 Weight: 68.8kg  Labs overdue. Called PCP to determine if labs have been drawn within the past year, left message on voicemail.

## 2023-04-22 NOTE — Telephone Encounter (Addendum)
Called pt's PCP office regarding updated labs and had to leave a message to have them faxed over. Will await and follow up.  Received labs from the pt's PCP office and Crea-0.80 on 09/22/22.  Age-52yrs  ZO-10.9UE Last OV 03/04/23 Dx-Afib

## 2023-05-11 ENCOUNTER — Other Ambulatory Visit: Payer: Self-pay | Admitting: Cardiology

## 2023-05-11 DIAGNOSIS — I482 Chronic atrial fibrillation, unspecified: Secondary | ICD-10-CM

## 2023-05-11 NOTE — Telephone Encounter (Signed)
Prescription refill request for Eliquis received. Indication:afib Last office visit:5/24 [id number] labs Age:77  Weight:68.8  kg  Prescription refilled

## 2023-06-11 DIAGNOSIS — L438 Other lichen planus: Secondary | ICD-10-CM | POA: Diagnosis not present

## 2023-06-24 DIAGNOSIS — Z23 Encounter for immunization: Secondary | ICD-10-CM | POA: Diagnosis not present

## 2023-06-30 DIAGNOSIS — Z23 Encounter for immunization: Secondary | ICD-10-CM | POA: Diagnosis not present

## 2023-07-07 ENCOUNTER — Other Ambulatory Visit: Payer: Self-pay | Admitting: Cardiology

## 2023-07-23 DIAGNOSIS — A6920 Lyme disease, unspecified: Secondary | ICD-10-CM | POA: Diagnosis not present

## 2023-07-23 DIAGNOSIS — R109 Unspecified abdominal pain: Secondary | ICD-10-CM | POA: Diagnosis not present

## 2023-07-23 DIAGNOSIS — I10 Essential (primary) hypertension: Secondary | ICD-10-CM | POA: Diagnosis not present

## 2023-07-23 DIAGNOSIS — Z1389 Encounter for screening for other disorder: Secondary | ICD-10-CM | POA: Diagnosis not present

## 2023-07-23 DIAGNOSIS — Z0000 Encounter for general adult medical examination without abnormal findings: Secondary | ICD-10-CM | POA: Diagnosis not present

## 2023-07-23 DIAGNOSIS — K219 Gastro-esophageal reflux disease without esophagitis: Secondary | ICD-10-CM | POA: Diagnosis not present

## 2023-07-23 DIAGNOSIS — Z7901 Long term (current) use of anticoagulants: Secondary | ICD-10-CM | POA: Diagnosis not present

## 2023-07-23 DIAGNOSIS — I480 Paroxysmal atrial fibrillation: Secondary | ICD-10-CM | POA: Diagnosis not present

## 2023-07-23 DIAGNOSIS — R079 Chest pain, unspecified: Secondary | ICD-10-CM | POA: Diagnosis not present

## 2023-08-20 ENCOUNTER — Other Ambulatory Visit: Payer: Self-pay | Admitting: Cardiology

## 2023-08-20 DIAGNOSIS — I482 Chronic atrial fibrillation, unspecified: Secondary | ICD-10-CM

## 2023-08-20 NOTE — Telephone Encounter (Signed)
Prescription refill request for Eliquis received. Indication: PAF Last office visit: 03/04/23  Daiva Nakayama MD Scr: 0.89 on 07/23/23  [facility] Age: 77 Weight: 68.8kg  Based on above findings Eliquis 5mg  twice daily is the appropriate dose.  Refills approved.

## 2023-09-15 DIAGNOSIS — L821 Other seborrheic keratosis: Secondary | ICD-10-CM | POA: Diagnosis not present

## 2023-09-15 DIAGNOSIS — L820 Inflamed seborrheic keratosis: Secondary | ICD-10-CM | POA: Diagnosis not present

## 2023-09-15 DIAGNOSIS — L720 Epidermal cyst: Secondary | ICD-10-CM | POA: Diagnosis not present

## 2023-10-02 DIAGNOSIS — E785 Hyperlipidemia, unspecified: Secondary | ICD-10-CM | POA: Diagnosis not present

## 2023-10-02 DIAGNOSIS — I10 Essential (primary) hypertension: Secondary | ICD-10-CM | POA: Diagnosis not present

## 2023-10-02 DIAGNOSIS — E559 Vitamin D deficiency, unspecified: Secondary | ICD-10-CM | POA: Diagnosis not present

## 2023-10-02 DIAGNOSIS — E049 Nontoxic goiter, unspecified: Secondary | ICD-10-CM | POA: Diagnosis not present

## 2023-10-02 DIAGNOSIS — Z1212 Encounter for screening for malignant neoplasm of rectum: Secondary | ICD-10-CM | POA: Diagnosis not present

## 2023-10-05 DIAGNOSIS — H40023 Open angle with borderline findings, high risk, bilateral: Secondary | ICD-10-CM | POA: Diagnosis not present

## 2023-10-05 DIAGNOSIS — H268 Other specified cataract: Secondary | ICD-10-CM | POA: Diagnosis not present

## 2023-10-08 DIAGNOSIS — Z7901 Long term (current) use of anticoagulants: Secondary | ICD-10-CM | POA: Diagnosis not present

## 2023-10-08 DIAGNOSIS — J45909 Unspecified asthma, uncomplicated: Secondary | ICD-10-CM | POA: Diagnosis not present

## 2023-10-08 DIAGNOSIS — I10 Essential (primary) hypertension: Secondary | ICD-10-CM | POA: Diagnosis not present

## 2023-10-08 DIAGNOSIS — M8580 Other specified disorders of bone density and structure, unspecified site: Secondary | ICD-10-CM | POA: Diagnosis not present

## 2023-10-08 DIAGNOSIS — E785 Hyperlipidemia, unspecified: Secondary | ICD-10-CM | POA: Diagnosis not present

## 2023-10-08 DIAGNOSIS — R252 Cramp and spasm: Secondary | ICD-10-CM | POA: Diagnosis not present

## 2023-10-08 DIAGNOSIS — R809 Proteinuria, unspecified: Secondary | ICD-10-CM | POA: Diagnosis not present

## 2023-10-08 DIAGNOSIS — I77819 Aortic ectasia, unspecified site: Secondary | ICD-10-CM | POA: Diagnosis not present

## 2023-10-08 DIAGNOSIS — D472 Monoclonal gammopathy: Secondary | ICD-10-CM | POA: Diagnosis not present

## 2023-10-08 DIAGNOSIS — Z1331 Encounter for screening for depression: Secondary | ICD-10-CM | POA: Diagnosis not present

## 2023-10-08 DIAGNOSIS — I480 Paroxysmal atrial fibrillation: Secondary | ICD-10-CM | POA: Diagnosis not present

## 2023-10-08 DIAGNOSIS — Z1389 Encounter for screening for other disorder: Secondary | ICD-10-CM | POA: Diagnosis not present

## 2023-10-08 DIAGNOSIS — Z0000 Encounter for general adult medical examination without abnormal findings: Secondary | ICD-10-CM | POA: Diagnosis not present

## 2023-10-08 DIAGNOSIS — I700 Atherosclerosis of aorta: Secondary | ICD-10-CM | POA: Diagnosis not present

## 2023-10-27 ENCOUNTER — Encounter: Payer: Self-pay | Admitting: Nurse Practitioner

## 2023-10-27 ENCOUNTER — Telehealth: Payer: Self-pay

## 2023-10-27 ENCOUNTER — Ambulatory Visit (INDEPENDENT_AMBULATORY_CARE_PROVIDER_SITE_OTHER): Payer: Medicare Other | Admitting: Nurse Practitioner

## 2023-10-27 VITALS — BP 122/72 | HR 71 | Ht 62.0 in | Wt 153.0 lb

## 2023-10-27 DIAGNOSIS — I10 Essential (primary) hypertension: Secondary | ICD-10-CM | POA: Diagnosis not present

## 2023-10-27 DIAGNOSIS — Z860109 Personal history of other colon polyps: Secondary | ICD-10-CM | POA: Diagnosis not present

## 2023-10-27 DIAGNOSIS — K219 Gastro-esophageal reflux disease without esophagitis: Secondary | ICD-10-CM

## 2023-10-27 DIAGNOSIS — R1013 Epigastric pain: Secondary | ICD-10-CM

## 2023-10-27 DIAGNOSIS — Z7901 Long term (current) use of anticoagulants: Secondary | ICD-10-CM | POA: Diagnosis not present

## 2023-10-27 NOTE — Progress Notes (Signed)
 Brief Narrative 78 y.o. yo female known to Dr. Albertus (2021) with a past medical history not limited to colon polyps, GERD / hiatal hernia, atrial fibrillation, hypertension, glaucoma, sleep apnea.  Referred by PCP for abdominal pain and hiatal hernia.    ASSESSMENT    Two isolated episodes of severe epigastric pain unrelated to eating. Only on one of the episodes did the pain radiate through to her back. Cause not clear. GERD? Erosive esophagitis 2/2 to Fosamax? PUD? Gallbladder disease? Musculoskeletal pain? Had normal coronaries on cardiac cath cardiac cath in Feb 2023   Chronic GERD / moderate hiatal hernia on EGD in 2013 Patient self discontinued Nexium  a year ago due to concern for potential bone loss. Recently having some minor heartburn and resumed daily Nexium   History of colon polyps   No polyps on last surveillance colonoscopy in 2021  Afib, on Eliquis   See PMH for any additional medical history   PLAN    --Schedule for EGD. The risks and benefits of EGD with possible biopsies were discussed with the patient who agrees to proceed.  --If EGD is unrevealing, consider RUQ US  to evaluate for gallstones. Of note, LFTs in mid December were normal --Hold Eliquis  for 2 days before procedure - will instruct when and how to resume after procedure. Patient understands that there is a low but real risk of cardiovascular event such as heart attack, stroke, or embolism /  thrombosis while off blood thinner. The patient consents to proceed. Will communicate by phone or EMR with patient's prescribing provider to confirm that holding Eliquis  is reasonable in this case.  --Continue daily Nexium  40 mg before breakfast until time of EGD. Following that, consider changing to Famotidine .    HPI   Chief complaint :  Episodes of severe, sharp epigastric pain.   Meagan Key was last seen in 2021 at time of surveillance colonoscopy  About 3 months ago patient woke up from sleep with severe  epigastric pain radiating through to her back and up into her jaws.  The pain eventually subsided.  She had no further episodes until this past Saturday when again she woke up with severe, sharp epigastric pain.  This time the epigastric pain was nonradiating and it lasted only 5 minutes.  Patient saw her PCP and was started on omeprazole .  She had taken Nexium  on and off for years but stopped it a year ago due to concerns about potential for bone loss.  Furthermore, she had not been having any reflux symptoms due to dietary changes she made in trying to manage lichen planus.  As recommended by PCP she did take the omeprazole  for couple of months but it caused nausea so she stopped it.  Several days ago she resumed daily Nexium  to help with some minor reflux symptoms she recently started having..  She has not having any exertional chest pain.  No shortness of breath.  Rayleen does not take NSAIDs.  She takes Fosamax with a full glass of water.  Currently taking it only every other week. She is not aware of any problems with her gallbladder.  Routine labs in mid December by PCP remarkable for normal LFTs.  Normal WBC.  Normal hemoglobin of 13.1.   GI History / Studies   **May not be a complete list of studies  Surveillance colonoscopy March 2021 -Small cecal angioectasias.  Small internal hemorrhoids.  Otherwise normal exam  EGD 2013 for dysphagia -- Moderate-sized hiatal hernia with free reflux.  No findings of Barrett's esophagus.  Esophagus was empirically dilated..    Labs      Latest Ref Rng & Units 11/21/2021    9:10 AM 09/10/2020    1:53 AM 07/25/2019    3:12 PM  CBC  WBC 3.4 - 10.8 x10E3/uL 5.0  7.4  7.2   Hemoglobin 11.1 - 15.9 g/dL 87.6  86.7  87.0   Hematocrit 34.0 - 46.6 % 36.8  39.9  38.5   Platelets 150 - 450 x10E3/uL 189  237  207.0     No results found for: LIPASE    Latest Ref Rng & Units 11/21/2021    9:10 AM 09/10/2020    1:53 AM 07/25/2019    3:12 PM  CMP  Glucose 70  - 99 mg/dL 70  875  872   BUN 8 - 27 mg/dL 23  28  23    Creatinine 0.57 - 1.00 mg/dL 9.06  8.97  9.16   Sodium 134 - 144 mmol/L 139  141  141   Potassium 3.5 - 5.2 mmol/L 4.8  3.9  3.9   Chloride 96 - 106 mmol/L 102  105  106   CO2 20 - 29 mmol/L 27  27  29    Calcium  8.7 - 10.3 mg/dL 9.3  9.8  9.0   Total Protein 6.0 - 8.3 g/dL   7.0   Total Bilirubin 0.2 - 1.2 mg/dL   0.3   Alkaline Phos 39 - 117 U/L   52   AST 0 - 37 U/L   16   ALT 0 - 35 U/L   13     Past Medical History:  Diagnosis Date   Allergy    Arthritis    hands   Asthma    rarely uses inhaler   Atrial fibrillation (HCC)    Cataracts, bilateral    Chicken pox    Endometriosis    Esophageal dysfunction    GERD (gastroesophageal reflux disease)    Glaucoma    surg for preventive for NARROW ANGLE   Hematuria    Hx of colonic polyp 06-12-2003   Colonoscopy Dr Dyane   Hyperlipemia    Hypertension    Lichen planus    Osteopenia 05/2013   T score -2.3 FRAX 13%/2.7%   Sleep apnea    Borderline.   Did not require sleeping.     SVT (supraventricular tachycardia) (HCC)    Thyroid  disease    nodule on thyroid    Tubular adenoma of colon    Past Surgical History:  Procedure Laterality Date   APPENDECTOMY     BREAST CYST EXCISION  AGE 45, 23, 56   bilateral   BREAST EXCISIONAL BIOPSY Bilateral    CATARACT EXTRACTION     CATARACT EXTRACTION     x2   COLONOSCOPY     DILATATION & CURETTAGE/HYSTEROSCOPY WITH TRUECLEAR N/A 02/22/2014   Procedure: DILATATION & CURETTAGE/HYSTEROSCOPY WITH TRUCLEAR;  Surgeon: Evalene SHAUNNA Organ, MD;  Location: [facility];  Service: Gynecology;  Laterality: N/A;   ESOPHAGEAL MANOMETRY N/A 11/29/2012   Procedure: ESOPHAGEAL MANOMETRY (EM);  Surgeon: Alm JONELLE Gander, MD;  Location: [facility];  Service: Endoscopy;  Laterality: N/A;   EYE SURGERY  2010   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   LAPAROSCOPIC ENDOMETRIOSIS FULGURATION  1972   LEFT HEART CATH AND CORONARY ANGIOGRAPHY  N/A 11/26/2021   Procedure: LEFT HEART CATH AND CORONARY ANGIOGRAPHY;  Surgeon: Burnard Debby LABOR, MD;  Location: [facility];  Service: Cardiovascular;  Laterality: N/A;   OOPHORECTOMY  1969   RSO   POLYPECTOMY     THYROIDECTOMY, PARTIAL  AGE 80   TONSILLECTOMY     UPPER GASTROINTESTINAL ENDOSCOPY  2013   Family History  Problem Relation Age of Onset   Colon cancer Mother 67   Alcohol  abuse Mother    Mental illness Mother    Lung cancer Father        smoker   Alcohol  abuse Father    Hypertension Father    Colon cancer Father 79   Breast cancer Maternal Aunt        Age 3   Heart attack Paternal Grandmother    Other Cousin        mother side-vocal cord cancer   Breast cancer Cousin 3   Social History   Tobacco Use   Smoking status: Former    Current packs/day: 0.00    Average packs/day: 0.3 packs/day for 25.0 years (7.5 ttl pk-yrs)    Types: Cigarettes    Start date: 09/15/1959    Quit date: 09/14/1984    Years since quitting: 39.1   Smokeless tobacco: Never  Vaping Use   Vaping status: Never Used  Substance Use Topics   Alcohol  use: Yes    Comment: once a week   Drug use: No   Current Outpatient Medications  Medication Sig Dispense Refill   acetaminophen  (TYLENOL ) 500 MG tablet Take 500 mg by mouth every 8 (eight) hours as needed for moderate pain (pain score 4-6).     alendronate (FOSAMAX) 70 MG tablet Take 70 mg by mouth every Wednesday. Every other week     amLODipine  (NORVASC ) 5 MG tablet TAKE ONE TABLET BY MOUTH DAILY 90 tablet 3   apixaban  (ELIQUIS ) 5 MG TABS tablet Take 1 tablet (5 mg total) by mouth 2 (two) times daily. 60 tablet 5   Artificial Tear Solution (GENTEAL TEARS) 0.1-0.2-0.3 % SOLN Place 1 drop into both eyes daily as needed (dry eyes).     Calcium  Citrate (CITRACAL PO) Take 2 tablets by mouth daily. 500 mg Calcium  & 1000 units of Vitamin D     esomeprazole  (NEXIUM ) 20 MG capsule Take 20 mg by mouth as needed.     metoprolol  succinate  (TOPROL -XL) 100 MG 24 hr tablet TAKE ONE TABLET BY MOUTH ONCE DAILY 90 tablet 3   metroNIDAZOLE (FLAGYL PO) Take by mouth as needed (for mouth inflammation).     nystatin (MYCOSTATIN) 100000 UNIT/ML suspension Take 5 mLs by mouth as needed (w/ flagyl).     Pravastatin  Sodium (PRAVACHOL  PO) Take by mouth.     triamcinolone  (KENALOG ) 0.025 % ointment Apply 1 Application topically 2 (two) times daily. Use as needed. 30 g 0   albuterol  (VENTOLIN  HFA) 108 (90 Base) MCG/ACT inhaler Inhale 2 puffs into the lungs every 6 (six) hours as needed for wheezing or shortness of breath. (Patient not taking: Reported on 10/27/2023)     chlorpheniramine (CHLOR-TRIMETON) 4 MG tablet Take 4 mg by mouth every 6 (six) hours as needed for allergies. (Patient not taking: Reported on 10/27/2023)     pravastatin  (PRAVACHOL ) 40 MG tablet Take 1 tablet (40 mg total) by mouth daily. (Patient not taking: Reported on 10/27/2023) 90 tablet 3   tacrolimus (PROTOPIC) 0.1 % ointment Apply topically 2 (two) times a week. Twice weekly (Patient not taking: Reported on 10/27/2023)     No current facility-administered medications for this visit.   Allergies  Allergen Reactions  Nitrofurantoin Shortness Of Breath and Other (See Comments)    Itching in throat    Cortisone     A-fib   Lisinopril Itching and Other (See Comments)    SOB and itching is in throat   Symbicort  [Budesonide -Formoterol  Fumarate]     A-fib     Review of Systems: Positive for sleeping problems, skin rash.  All other systems reviewed and negative except where noted in HPI.   Wt Readings from Last 3 Encounters:  10/27/23 153 lb (69.4 kg)  03/04/23 151 lb 9.6 oz (68.8 kg)  11/26/22 150 lb (68 kg)    Physical Exam:  BP 122/72   Pulse 71   Ht 5' 2 (1.575 m)   Wt 153 lb (69.4 kg)   BMI 27.98 kg/m  Constitutional:  Pleasant, generally well appearing female in no acute distress. Psychiatric:  Normal mood and affect. Behavior is normal. EENT: Pupils normal.   Conjunctivae are normal. No scleral icterus. Neck supple.  Cardiovascular: Normal rate, regular rhythm.  Pulmonary/chest: Effort normal and breath sounds normal. No wheezing, rales or rhonchi. Abdominal: Soft, nondistended, nontender. Bowel sounds active throughout. There are no masses palpable. No hepatomegaly. Neurological: Alert and oriented to person place and time.  Vina Dasen, NP  10/27/2023, 2:13 PM  Cc:  Referring Provider Onita Rush, MD

## 2023-10-27 NOTE — Progress Notes (Signed)
 Addendum: Reviewed and agree with assessment and management plan. Asha Grumbine, Carie Caddy, MD

## 2023-10-27 NOTE — Telephone Encounter (Signed)
 [facility] Pre-operative Risk Assessment     Request for surgical clearance:     Endoscopy Procedure  What type of surgery is being performed?     EGD  When is this surgery scheduled?     12/22/23  What type of clearance is required ?   Pharmacy  Are there any medications that need to be held prior to surgery and how long? Eliquis  x 2 days  Practice name and name of physician performing surgery?      [facility]  What is your office phone and fax number?      Phone- [phone]  Fax- [phone]  Anesthesia type (None, local, MAC, general) ?       MAC   Please route your response to Alan Fusi, CMA

## 2023-10-27 NOTE — Patient Instructions (Signed)
 Continue Nexium  daily before breakfast.   You have been scheduled for an endoscopy. Please follow written instructions given to you at your visit today.  If you use inhalers (even only as needed), please bring them with you on the day of your procedure.  If you take any of the following medications, they will need to be adjusted prior to your procedure:   DO NOT TAKE 7 DAYS PRIOR TO TEST- Trulicity (dulaglutide) Ozempic, Wegovy (semaglutide) Mounjaro (tirzepatide) Bydureon Bcise (exanatide extended release)  DO NOT TAKE 1 DAY PRIOR TO YOUR TEST Rybelsus (semaglutide) Adlyxin (lixisenatide) Victoza (liraglutide) Byetta (exanatide) ___________________________________________________________________________   If your blood pressure at your visit was 140/90 or greater, please contact your primary care physician to follow up on this.  _______________________________________________________  If you are age 6 or older, your body mass index should be between 23-30. Your Body mass index is 27.98 kg/m. If this is out of the aforementioned range listed, please consider follow up with your Primary Care Provider.  If you are age 73 or younger, your body mass index should be between 19-25. Your Body mass index is 27.98 kg/m. If this is out of the aformentioned range listed, please consider follow up with your Primary Care Provider.   ________________________________________________________  The [facility] providers would like to encourage you to use MYCHART to communicate with providers for non-urgent requests or questions.  Due to long hold times on the telephone, sending your provider a message by [facility] may be a faster and more efficient way to get a response.  Please allow 48 business hours for a response.  Please remember that this is for non-urgent requests.  _______________________________________________________

## 2023-10-28 NOTE — Telephone Encounter (Signed)
 Pharm request only. Will route to pharm for input on holding Eliquis. Last OV 02/2023 doing well.

## 2023-10-29 DIAGNOSIS — K769 Liver disease, unspecified: Secondary | ICD-10-CM | POA: Insufficient documentation

## 2023-10-29 DIAGNOSIS — R319 Hematuria, unspecified: Secondary | ICD-10-CM | POA: Insufficient documentation

## 2023-10-29 DIAGNOSIS — I700 Atherosclerosis of aorta: Secondary | ICD-10-CM | POA: Insufficient documentation

## 2023-10-29 DIAGNOSIS — R42 Dizziness and giddiness: Secondary | ICD-10-CM | POA: Insufficient documentation

## 2023-10-29 DIAGNOSIS — L900 Lichen sclerosus et atrophicus: Secondary | ICD-10-CM | POA: Insufficient documentation

## 2023-10-29 DIAGNOSIS — D6869 Other thrombophilia: Secondary | ICD-10-CM | POA: Insufficient documentation

## 2023-10-29 NOTE — Telephone Encounter (Signed)
 Patient with diagnosis of A fib on Eliquis  for anticoagulation.    Procedure: EGD Date of procedure: 12/22/23   CHA2DS2-VASc Score = 5  This indicates a 7.2% annual risk of stroke. The patient's score is based upon: CHF History: 0 HTN History: 1 Diabetes History: 0 Stroke History: 0 Vascular Disease History: 1 Age Score: 2 Gender Score: 1   CrCl 56 ml/min Platelet count 204K   Per office protocol, patient can hold Eliquis  for 2 days prior to procedure.     **This guidance is not considered finalized until pre-operative APP has relayed final recommendations.**

## 2023-10-29 NOTE — Telephone Encounter (Signed)
 Informed patient to hold Eliquis 2 days prior to her procedure. Patient verbalized understanding.

## 2023-10-29 NOTE — Telephone Encounter (Signed)
   Name: Meagan Key  DOB: 1946/03/24  MRN: [id number]   Primary Cardiologist: Lynwood Schilling, MD  Chart reviewed as part of pre-operative protocol coverage. Meagan Key was last seen on 03/04/2023 by Dr. Schilling.  She was doing well at that time with no cardiac complaints and reports of regular exercise.   Therefore, based on ACC/AHA guidelines, the patient would be an acceptable risk for the planned procedure without further cardiovascular testing.   Per Pharm D, patient may hold Eliquis  for 2 days prior to procedure.    I will route this recommendation to the requesting party via Epic fax function and remove from pre-op pool. Please call with questions.  Barnie Hila, NP 10/29/2023, 10:53 AM

## 2023-11-02 DIAGNOSIS — Z1212 Encounter for screening for malignant neoplasm of rectum: Secondary | ICD-10-CM | POA: Diagnosis not present

## 2023-11-02 DIAGNOSIS — E785 Hyperlipidemia, unspecified: Secondary | ICD-10-CM | POA: Diagnosis not present

## 2023-11-16 ENCOUNTER — Telehealth: Payer: Self-pay | Admitting: Cardiology

## 2023-11-16 NOTE — Telephone Encounter (Signed)
Patient states that she was on the trade mill and her heart sped up to 154 beats and she has some chest pain. This is the second time it happen in a week. Calling to see if she should be concern. Please advise

## 2023-11-16 NOTE — Telephone Encounter (Signed)
Patient identification verified by 2 forms. Marilynn Rail, RN    Called and spoke to patient  Patient states:   -while on treadmill HR increased from 80-154BPM   -she developed chest pain with increase in pulse   -has history of SVT   -this occurred while on treadmill with speed of 1.6 with no incline   -this also occurred two week ago  Patient denies at time of call:   -Chest pain   -SOB/difficulty breathing Patient scheduled for OV 1/31 at 2:45pm with PA Wynema Birch  Reviewed ED warning signs/precautions  Patient verbalized understanding, no questions at this time

## 2023-11-17 NOTE — Telephone Encounter (Signed)
Patient identification verified by 2 forms. Meagan Rail, RN    Called and spoke to patient  Relayed provider message below  Patient states:   -has appointment for upper endoscopy 03/04  -she will follow up with primary  Patient verbalized understanding, no questions at this time

## 2023-11-17 NOTE — Telephone Encounter (Signed)
Rollene Rotunda, MD  You11 hours ago (8:34 PM)    She had normal coronaries in 2023.  I would have her check with her PCP about possible non anginal features and keep her appt with [facility].

## 2023-11-20 ENCOUNTER — Encounter: Payer: Self-pay | Admitting: Physician Assistant

## 2023-11-20 ENCOUNTER — Ambulatory Visit: Payer: Medicare Other | Attending: Physician Assistant | Admitting: Physician Assistant

## 2023-11-20 VITALS — BP 122/76 | HR 64 | Ht 62.0 in | Wt 153.8 lb

## 2023-11-20 DIAGNOSIS — I480 Paroxysmal atrial fibrillation: Secondary | ICD-10-CM | POA: Insufficient documentation

## 2023-11-20 DIAGNOSIS — R079 Chest pain, unspecified: Secondary | ICD-10-CM | POA: Diagnosis not present

## 2023-11-20 DIAGNOSIS — I10 Essential (primary) hypertension: Secondary | ICD-10-CM | POA: Diagnosis not present

## 2023-11-20 NOTE — Progress Notes (Signed)
Cardiology Office Note:  .   Date:  11/20/2023  ID:  Meagan Key, DOB 08-15-1946, MRN [id number] PCP: Meagan Corn, MD  [facility] Providers Cardiologist:  Meagan Rotunda, MD     History of Present Illness: .   Meagan Key is a 78 y.o. female with past medical history of PAF on Eliquis, OSA on CPAP, hyperlipidemia and hypertension.  ETT in 2018 was normal.  Echocardiogram in October 2019 showed EF 60 to 65%, grade 2 DD, mild MR.  She previously had A-fib with RVR and was seen in the ED.  She was able to spontaneously convert back to sinus rhythm.  Previous treadmill stress test in January 2023 was abnormal study with 2 mm downsloping ST segment depression in multiple leads.  Due to exertional symptom, she ultimately underwent a cardiac catheterization on 11/26/2021 which showed EF 55 to 65%, normal coronary arteries.  Patient was last seen by Dr. Antoine Key in May 2024 at which time she was doing well.  She was recently cleared for GI procedure endoscopy after holding Eliquis for 2 days.  She recent contact cardiology service on 11/16/2023 complaining of chest pain while walking on the treadmill after heart rate increased from 80 to 150s.  Patient's presents today for follow-up.  She says she usually walk on the treadmill 3 times a week.  Previously, her heart rate would only go up to the high 90s, however last week, there was 1 time where her heart rate jumped up to 150s.  She stopped the treadmill and her heart rate eventually settled down.  She has not tried to walk on the treadmill again.  Since she had normal coronary arteries on the cardiac catheterization less than 2 years ago, suspicion for ischemic etiology is fairly low.  It is possible that she went into either SVT or A-fib on the treadmill.  I discussed her case with DOD Dr. Herbie Key, for the time being, we will hold off on coronary CT or heart monitor unless symptom recurs.  We plan to see the patient back in 3 months.  She is okay  to proceed with GI workup.  ROS:   Patient complained of a solitary episode of chest pain while on the treadmill.  She has not had any recurrence.  Studies Reviewed: Marland Kitchen   EKG Interpretation Date/Time:  Friday November 20 2023 14:37:52 Key Ventricular Rate:  64 PR Interval:  152 QRS Duration:  78 QT Interval:  402 QTC Calculation: 414 R Axis:   -7  Text Interpretation: Normal sinus rhythm Nonspecific ST and T wave abnormality When compared with ECG of 10-Sep-2020 04:54, PREVIOUS ECG IS PRESENT Confirmed by Meagan Key [phone]) on 11/20/2023 9:42:46 PM    Cardiac Studies & Procedures   CARDIAC CATHETERIZATION  CARDIAC CATHETERIZATION 11/26/2021  Narrative   LV end diastolic pressure is normal.   The left ventricular ejection fraction is 55-65% by visual estimate.  Normal epicardial coronary arteries.  Normal LV function with EF estimated at 55 to 60%, LVEDP 9 mmHg.  Minx closure right femoral artery.  RECOMMENDATION. Resume Eliquis at previous dose effective tomorrow.  Findings Coronary Findings Diagnostic  Dominance: Right  No diagnostic findings have been documented. Intervention  No interventions have been documented.   STRESS TESTS  EXERCISE TOLERANCE TEST (ETT) 11/15/2021  Narrative   The ECG was positive for ischemia   2.0 mm of down sloping ST depression (II, III, aVF, V4 and V5) was noted.   Exercise capacity was fair.  Patient exercised for 6 min and 23 sec. Maximum HR of 144 bpm. MPHR 100.0 %. Peak METS 7.5   Normal blood pressure and normal heart rate response noted during stress.  ECHOCARDIOGRAM  ECHOCARDIOGRAM COMPLETE 07/23/2018  Narrative *Meagan Key* *Meagan Key [facility]* [address] [place], [place] [address] [phone]  ------------------------------------------------------------------- Transthoracic Echocardiography  Patient:    Meagan, Key MR #:       [id number] Study Date: 07/23/2018 Gender:     F Age:        13 Height:      157.5 cm Weight:     67.1 kg BSA:        1.73 m^2 Pt. Status: Room:  ATTENDING    Meagan Key REFERRING    Meagan Key PERFORMING   [facility], Outpatient SONOGRAPHER  Meagan Key  cc:  ------------------------------------------------------------------- LV EF: 60% -   65%  ------------------------------------------------------------------- Indications:      Atrial fibrillation - 427.31.  ------------------------------------------------------------------- History:   Risk factors:  Hypertension. Dyslipidemia.  ------------------------------------------------------------------- Study Conclusions  - Left ventricle: The cavity size was normal. Systolic function was normal. The estimated ejection fraction was in the range of 60% to 65%. Wall motion was normal; there were no regional wall motion abnormalities. Features are consistent with a pseudonormal left ventricular filling pattern, with concomitant abnormal relaxation and increased filling pressure (grade 2 diastolic dysfunction). - Mitral valve: Calcified annulus. There was mild regurgitation. - Atrial septum: A patent foramen ovale cannot be excluded. - Tricuspid valve: There was mild regurgitation. - Pulmonary arteries: PA peak pressure: 36 mm Hg (S).  Impressions:  - The right ventricular systolic pressure was increased consistent with mild pulmonary hypertension.  Recommendations:  recommend agitated saline contrast injection to rule out PFO.  ------------------------------------------------------------------- Study data:  No prior study was available for comparison.  Study status:  Routine.  Procedure:  The patient reported no pain pre or post test. Transthoracic echocardiography. Image quality was adequate.  Study completion:  There were no complications. Transthoracic echocardiography.  M-mode, complete 2D, spectral Doppler, and color Doppler.  Birthdate:  Patient  birthdate: [birth year].  Age:  Patient is 78 yr old.  Sex:  Gender: female. BMI: 27.1 kg/m^2.  Blood pressure:     136/84  Patient status: Outpatient.  Study date:  Study date: 07/23/2018. Study time: 08:11 AM.  Location:  Echo laboratory.  -------------------------------------------------------------------  ------------------------------------------------------------------- Left ventricle:  The cavity size was normal. Systolic function was normal. The estimated ejection fraction was in the range of 60% to 65%. Wall motion was normal; there were no regional wall motion abnormalities. Features are consistent with a pseudonormal left ventricular filling pattern, with concomitant abnormal relaxation and increased filling pressure (grade 2 diastolic dysfunction).  ------------------------------------------------------------------- Aortic valve:   Trileaflet; normal thickness leaflets. Mobility was not restricted.  Doppler:  Transvalvular velocity was within the normal range. There was no stenosis. There was no regurgitation.  ------------------------------------------------------------------- Aorta:  Aortic root: The aortic root was normal in size.  ------------------------------------------------------------------- Mitral valve:   Calcified annulus. Mobility was not restricted. Doppler:  Transvalvular velocity was within the normal range. There was no evidence for stenosis. There was mild regurgitation.  ------------------------------------------------------------------- Left atrium:  The atrium was normal in size.  ------------------------------------------------------------------- Atrial septum:  A patent foramen ovale cannot be excluded.  ------------------------------------------------------------------- Right ventricle:  The cavity size was normal. Wall thickness was normal. Systolic function was  normal.  ------------------------------------------------------------------- Pulmonic valve:  Structurally normal valve.   Cusp separation was normal.  Doppler:  Transvalvular velocity was within the normal range. There was no evidence for stenosis. There was no regurgitation.  ------------------------------------------------------------------- Tricuspid valve:   Structurally normal valve.    Doppler: Transvalvular velocity was within the normal range. There was mild regurgitation.  ------------------------------------------------------------------- Pulmonary artery:   The main pulmonary artery was normal-sized. Systolic pressure was within the normal range.  ------------------------------------------------------------------- Right atrium:  The atrium was normal in size.  ------------------------------------------------------------------- Pericardium:  There was no pericardial effusion.  ------------------------------------------------------------------- Systemic veins: Inferior vena cava: The vessel was normal in size.  ------------------------------------------------------------------- Measurements  Left ventricle                           Value        Reference LV ID, ED, PLAX chordal          (L)     40    mm     43 - 52 LV ID, ES, PLAX chordal                  28    mm     23 - 38 LV fx shortening, PLAX chordal           30    %      >=29 LV PW thickness, ED                      10    mm     ---------- IVS/LV PW ratio, ED                      1            <=1.3 Stroke volume, 2D                        41    ml     ---------- Stroke volume/bsa, 2D                    24    ml/m^2 ---------- LV ejection fraction, 1-p A4C            59    %      ---------- LV end-diastolic volume, 2-p             38    ml     ---------- LV end-systolic volume, 2-p              15    ml     ---------- LV ejection fraction, 2-p                61    %      ---------- Stroke volume, 2-p                        23    ml     ---------- LV end-diastolic volume/bsa, 2-p         22    ml/m^2 ---------- LV end-systolic volume/bsa, 2-p          8     ml/m^2 ---------- Stroke volume/bsa, 2-p                   13.3  ml/m^2 ---------- LV e&', lateral  7.29  cm/s   ---------- LV E/e&', lateral                         9.34         ---------- LV e&', medial                            4.68  cm/s   ---------- LV E/e&', medial                          14.55        ---------- LV e&', average                           5.99  cm/s   ---------- LV E/e&', average                         11.38        ----------  Ventricular septum                       Value        Reference IVS thickness, ED                        10    mm     ----------  LVOT                                     Value        Reference LVOT ID, S                               15    mm     ---------- LVOT area                                1.77  cm^2   ---------- LVOT peak velocity, S                    106   cm/s   ---------- LVOT mean velocity, S                    69.7  cm/s   ---------- LVOT VTI, S                              23.2  cm     ----------  Aorta                                    Value        Reference Aortic root ID, ED                       27    mm     ----------  Left atrium                              Value  Reference LA ID, A-P, ES                           34    mm     ---------- LA ID/bsa, A-P                           1.96  cm/m^2 <=2.2 LA volume, S                             39.2  ml     ---------- LA volume/bsa, S                         22.6  ml/m^2 ---------- LA volume, ES, 1-p A4C                   42.5  ml     ---------- LA volume/bsa, ES, 1-p A4C               24.5  ml/m^2 ---------- LA volume, ES, 1-p A2C                   32.9  ml     ---------- LA volume/bsa, ES, 1-p A2C               19    ml/m^2 ----------  Mitral valve                             Value         Reference Mitral E-wave peak velocity              68.1  cm/s   ---------- Mitral A-wave peak velocity              45.4  cm/s   ---------- Mitral deceleration time                 197   ms     150 - 230 Mitral E/A ratio, peak                   1.5          ----------  Pulmonary arteries                       Value        Reference PA pressure, S, DP               (H)     36    mm Hg  <=30  Tricuspid valve                          Value        Reference Tricuspid regurg peak velocity           286   cm/s   ---------- Tricuspid peak RV-RA gradient            33    mm Hg  ----------  Right atrium                             Value        Reference RA ID, S-I, ES, A4C  48.6  mm     34 - 49 RA area, ES, A4C                         10.8  cm^2   8.3 - 19.5 RA volume, ES, A/L                       20.6  ml     ---------- RA volume/bsa, ES, A/L                   11.9  ml/m^2 ----------  Systemic veins                           Value        Reference Estimated CVP                            3     mm Hg  ----------  Right ventricle                          Value        Reference TAPSE                                    19.2  mm     ---------- RV pressure, S, DP               (H)     36    mm Hg  <=30 RV s&', lateral, S                        12.6  cm/s   ----------  Legend: (L)  and  (H)  mark values outside specified reference range.  ------------------------------------------------------------------- Prepared and Electronically Authenticated by  Armanda Magic, MD 2019-10-04T09:28:50             Risk Assessment/Calculations:             Physical Exam:   VS:  BP 122/76 (BP Location: Left Arm, Cuff Size: Normal)   Pulse 64   Ht 5\' 2"  (1.575 m)   Wt 153 lb 12.8 oz (69.8 kg)   SpO2 93%   BMI 28.13 kg/m    Wt Readings from Last 3 Encounters:  11/20/23 153 lb 12.8 oz (69.8 kg)  10/27/23 153 lb (69.4 kg)  03/04/23 151 lb 9.6 oz (68.8 kg)    GEN: Well  nourished, well developed in no acute distress NECK: No JVD; No carotid bruits CARDIAC: RRR, no murmurs, rubs, gallops RESPIRATORY:  Clear to auscultation without rales, wheezing or rhonchi  ABDOMEN: Soft, non-tender, non-distended EXTREMITIES:  No edema; No deformity   ASSESSMENT AND PLAN: .     Chest pain Recent episode of heart rate increasing to 150s during treadmill exercise, which is higher than her usual peak heart rate.  Symptom was accompanied with chest pain.  She had a prior history of chest pain in the setting of A-fib as well.  Given her history of paroxysmal atrial fibrillation (PAF) and normal coronary arteries on cardiac catheterization less than two years ago, the likelihood of an ischemic etiology is low. Possible SVT or AFib episode during exercise. -Will hold off on coronary CT or heart monitor  unless symptoms recur.  Paroxysmal Atrial Fibrillation On Eliquis for anticoagulation. No recent episodes reported. -Continue Eliquis as prescribed.  She is pending upcoming GI evaluation.  She was cleared to proceed after holding Eliquis for 2 days.  She is a low risk candidate.  Hypertension: Blood pressure well-controlled         Dispo: Follow-up in 32-month  Signed, Meagan Key, [place]

## 2023-11-20 NOTE — Patient Instructions (Signed)
Medication Instructions:  NO CHANGES *If you need a refill on your cardiac medications before your next appointment, please call your pharmacy*   Lab Work: NO LABS If you have labs (blood work) drawn today and your tests are completely normal, you will receive your results only by: MyChart Message (if you have MyChart) OR A paper copy in the mail If you have any lab test that is abnormal or we need to change your treatment, we will call you to review the results.   Testing/Procedures: NO TESTING   Follow-Up: At [facility], you and your health needs are our priority.  As part of our continuing mission to provide you with exceptional heart care, we have created designated Provider Care Teams.  These Care Teams include your primary Cardiologist (physician) and Advanced Practice Providers (APPs -  Physician Assistants and Nurse Practitioners) who all work together to provide you with the care you need, when you need it.  Your next appointment:   3 month(s)  Provider:   Rollene Rotunda, MD   Other Instructions

## 2023-12-01 NOTE — Progress Notes (Unsigned)
Marland Kitchen

## 2023-12-01 NOTE — Progress Notes (Unsigned)
PATIENT: Meagan Key DOB: 05/23/1946  REASON FOR VISIT: follow up HISTORY FROM: patient  No chief complaint on file.    HISTORY OF PRESENT ILLNESS:  12/01/23 ALL:  Meagan Key returns for follow up for OSA on CPAP.     11/26/2022 ALL:  Meagan Key returns for follow up for OSA on CPAP. She was last seen 02/2022 and complained of dry eyes. There was a large air leak and mask refitting orders placed. She is now using a nasal mask and reports dry eye is significantly improved. She is doing well on therapy. She continues to have daytime sleepiness but is not overly bothered by this. She denies sleepiness with driving.   She is followed by ophthalmology for possible open angle glaucoma. Last visit 09/2022 and advised monitoring. She is planning follow up 09/2023.     03/19/2022 ALL: [name] returns for follow up for OSA on CPAP. She was seen 10/2021 and doing well. Leak was noted but improved with recheck in 12/2021. She called 03/18/2022 reporting more concerns of dry eyes. She is using a nasal pillow. She does not feel air leaking but reports that her husband will wake her up to fix her mask as he hears the air leaking. She reports that her eyes get very painful at times. Pain wraps across her face and temples. She is using eye drops that do help. She saw ophthalmology who told her she had dry eyes. She is not sure CPAP is working for her. She feels that she wakes frequently and often tosses and turns until she gets 4 hours of usage. She continues to have daytime sleepiness but reports that it is not bothersome for her.     11/07/2021 ALL: Meagan Key is a 78 y.o. female here today for follow up for OSA on CPAP.  She was last seen by Dr Frances Furbish for initial CPAP visit in 03/2021. Compliance was excellent, however, she had a leak on the higher side of 34l/min. She is using a nasal pillow. She reports that it fits great if she sleeps on her back. If she is on her side, it leaks. She feels nasal pillow is  very comfortable. She is feeling well. Sleeping well. She was diagnosed with Covid 12/21. She stopped using CPAP for about 3 weeks. She has recently resumed therapy and doing well.     HISTORY: (copied from Dr Teofilo Pod previous note)   Meagan Key is a 78 year old right-handed woman with an underlying medical history of MGUS, hypertension, hyperlipidemia, asthma, vitamin D deficiency, paroxysmal A. fib, glaucoma, reflux disease, osteopenia, SVT, and mildly overweight state, who presents for follow-up consultation of her obstructive sleep apnea after interim testing and starting AutoPap therapy.  The patient is unaccompanied today.  I first met her at the request of her primary care physician on 10/03/2020, at which time she reported snoring and excessive daytime somnolence.  She had a prior diagnosis of mild obstructive sleep apnea in 2014.  She was advised to proceed with sleep testing.  She had a home sleep test on 10/31/2020 which indicated severe obstructive sleep apnea with an AHI of 39.4/h, O2 nadir 79%.  She was advised to start AutoPap therapy at home.  Her set up date was 01/02/2021.   Today, 04/01/2021: I reviewed her AutoPap compliance data from 02/26/2021 through 03/27/2021, which is a total of 30 days, during which time she used her machine every night with percent use days greater than 4 hours at 97%, indicating excellent  compliance with an average usage of 5 hours and 4 minutes, residual AHI 4/h, on the higher end of the goal, 95th percentile of pressure at 11.7 cm with a range of 7 to 12 cm with EPR, leak on the higher side with the 95th percentile at 34.4 L/min.  She reports doing fairly well, she has adjusted to treatment, feels like it is somewhat helpful, she has been using the nasal cushion interface but has had some trouble getting a replacement every month and also a replacement of her filter.  She eventually received supplies after calling her DME company several times.  She is overall  motivated to continue with treatment.  She does sleep about 7 to 8 hours on average and admits to taking off her mask once she has achieved more than 4 hours of usage as she does have some discomfort and air leaks from time to time and dislodging of the mask is cumbersome for her. She feels that the seal is best when she sleeps on her back but she gets tired sleeping on her back.  Per husband's feedback, her snoring has improved, her nocturia is also better.   REVIEW OF SYSTEMS: Out of a complete 14 system review of symptoms, the patient complains only of the following symptoms, daytime sleepiness, and all other reviewed systems are negative.  ESS: 14/24   ALLERGIES: Allergies  Allergen Reactions   Nitrofurantoin Shortness Of Breath and Other (See Comments)    Itching in throat    Cortisone     A-fib   Lisinopril Itching and Other (See Comments)    SOB and itching is in throat   Symbicort [Budesonide-Formoterol Fumarate]     A-fib    HOME MEDICATIONS: Outpatient Medications Prior to Visit  Medication Sig Dispense Refill   acetaminophen (TYLENOL) 500 MG tablet Take 500 mg by mouth every 8 (eight) hours as needed for moderate pain (pain score 4-6).     albuterol (VENTOLIN HFA) 108 (90 Base) MCG/ACT inhaler Inhale 2 puffs into the lungs every 6 (six) hours as needed for wheezing or shortness of breath.     alendronate (FOSAMAX) 70 MG tablet Take 70 mg by mouth every Wednesday. Every other week     amLODipine (NORVASC) 5 MG tablet TAKE ONE TABLET BY MOUTH DAILY 90 tablet 3   apixaban (ELIQUIS) 5 MG TABS tablet Take 1 tablet (5 mg total) by mouth 2 (two) times daily. 60 tablet 5   Artificial Tear Solution (GENTEAL TEARS) 0.1-0.2-0.3 % SOLN Place 1 drop into both eyes daily as needed (dry eyes).     Calcium Citrate (CITRACAL PO) Take 2 tablets by mouth daily. 500 mg Calcium & 1000 units of Vitamin D     chlorpheniramine (CHLOR-TRIMETON) 4 MG tablet Take 4 mg by mouth every 6 (six) hours as  needed for allergies.     clobetasol (TEMOVATE) 0.05 % GEL Apply topically as needed.     esomeprazole (NEXIUM) 20 MG capsule Take 20 mg by mouth as needed.     metoprolol succinate (TOPROL-XL) 100 MG 24 hr tablet TAKE ONE TABLET BY MOUTH ONCE DAILY 90 tablet 3   metroNIDAZOLE (FLAGYL PO) Take by mouth as needed (for mouth inflammation).     nystatin (MYCOSTATIN) 100000 UNIT/ML suspension Take 5 mLs by mouth as needed (w/ flagyl).     pravastatin (PRAVACHOL) 40 MG tablet Take 1 tablet (40 mg total) by mouth daily. 90 tablet 3   Pravastatin Sodium (PRAVACHOL PO) Take by mouth.  tacrolimus (PROTOPIC) 0.1 % ointment Apply topically 2 (two) times a week. Twice weekly     triamcinolone (KENALOG) 0.025 % ointment Apply 1 Application topically 2 (two) times daily. Use as needed. 30 g 0   No facility-administered medications prior to visit.    PAST MEDICAL HISTORY: Past Medical History:  Diagnosis Date   Allergy    Arthritis    hands   Asthma    rarely uses inhaler   Atrial fibrillation (HCC)    Cataracts, bilateral    Chicken pox    Endometriosis    Esophageal dysfunction    GERD (gastroesophageal reflux disease)    Glaucoma    surg for preventive for NARROW ANGLE   Hematuria    Hx of colonic polyp 06-12-2003   Colonoscopy Dr Madilyn Fireman   Hyperlipemia    Hypertension    Lichen planus    Osteopenia 05/2013   T score -2.3 FRAX 13%/2.7%   Sleep apnea    Borderline.   Did not require sleeping.     SVT (supraventricular tachycardia) (HCC)    Thyroid disease    nodule on thyroid   Tubular adenoma of colon     PAST SURGICAL HISTORY: Past Surgical History:  Procedure Laterality Date   APPENDECTOMY     BREAST CYST EXCISION  AGE 28, 23, 56   bilateral   BREAST EXCISIONAL BIOPSY Bilateral    CATARACT EXTRACTION     CATARACT EXTRACTION     x2   COLONOSCOPY     DILATATION & CURETTAGE/HYSTEROSCOPY WITH TRUECLEAR N/A 02/22/2014   Procedure: DILATATION & CURETTAGE/HYSTEROSCOPY WITH  TRUCLEAR;  Surgeon: Dara Lords, MD;  Location: [facility];  Service: Gynecology;  Laterality: N/A;   ESOPHAGEAL MANOMETRY N/A 11/29/2012   Procedure: ESOPHAGEAL MANOMETRY (EM);  Surgeon: Mardella Layman, MD;  Location: [facility];  Service: Endoscopy;  Laterality: N/A;   EYE SURGERY  2010   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   LAPAROSCOPIC ENDOMETRIOSIS FULGURATION  1972   LEFT HEART CATH AND CORONARY ANGIOGRAPHY N/A 11/26/2021   Procedure: LEFT HEART CATH AND CORONARY ANGIOGRAPHY;  Surgeon: Lennette Bihari, MD;  Location: [facility];  Service: Cardiovascular;  Laterality: N/A;   OOPHORECTOMY  1969   RSO   POLYPECTOMY     THYROIDECTOMY, PARTIAL  AGE 78   TONSILLECTOMY     UPPER GASTROINTESTINAL ENDOSCOPY  2013    FAMILY HISTORY: Family History  Problem Relation Age of Onset   Colon cancer Mother 10   Alcohol abuse Mother    Mental illness Mother    Lung cancer Father        smoker   Alcohol abuse Father    Hypertension Father    Colon cancer Father 39   Breast cancer Maternal Aunt        Age 58   Heart attack Paternal Grandmother    Other Cousin        mother side-vocal cord cancer   Breast cancer Cousin 80    SOCIAL HISTORY: Social History   Socioeconomic History   Marital status: Married    Spouse name: Not on file   Number of children: 2   Years of education: Not on file   Highest education level: Not on file  Occupational History   Occupation: attorney    Employer: [employer]   Occupation: attorney  Tobacco Use   Smoking status: Former    Current packs/day: 0.00    Average packs/day: 0.3  packs/day for 25.0 years (7.5 ttl pk-yrs)    Types: Cigarettes    Start date: 09/15/1959    Quit date: 09/14/1984    Years since quitting: 39.2   Smokeless tobacco: Never  Vaping Use   Vaping status: Never Used  Substance and Sexual Activity   Alcohol use: Yes    Comment: once a week   Drug use: No   Sexual activity: Not Currently     Birth control/protection: Post-menopausal    Comment: 1st intercourse 78 yo-Fewer than 5 partners  Other Topics Concern   Not on file  Social History Narrative   MARRIED; 2 CHILDREN   LAW DEGREE; ATTORNEY   Social Drivers of Health   Financial Resource Strain: Not on file  Food Insecurity: Not on file  Transportation Needs: Not on file  Physical Activity: Not on file  Stress: Not on file  Social Connections: Not on file  Intimate Partner Violence: Not on file     PHYSICAL EXAM  There were no vitals filed for this visit.    There is no height or weight on file to calculate BMI.  Generalized: Well developed, in no acute distress  Cardiology: normal rate and rhythm, no murmur noted Respiratory: clear to auscultation bilaterally  Neurological examination  Mentation: Alert oriented to time, place, history taking. Follows all commands speech and language fluent Cranial nerve II-XII: Pupils were equal round reactive to light. Extraocular movements were full, visual field were full  Motor: The motor testing reveals 5 over 5 strength of all 4 extremities. Good symmetric motor tone is noted throughout.  Gait and station: Gait is normal.  Eyes: no obvious erythema, edema or drainage from eyes.    DIAGNOSTIC DATA (LABS, IMAGING, TESTING) - I reviewed patient records, labs, notes, testing and imaging myself where available.      No data to display           Lab Results  Component Value Date   WBC 5.0 11/21/2021   HGB 12.3 11/21/2021   HCT 36.8 11/21/2021   MCV 87 11/21/2021   PLT 189 11/21/2021      Component Value Date/Time   NA 139 11/21/2021 0910   K 4.8 11/21/2021 0910   CL 102 11/21/2021 0910   CO2 27 11/21/2021 0910   GLUCOSE 70 11/21/2021 0910   GLUCOSE 124 (H) 09/10/2020 0153   BUN 23 11/21/2021 0910   CREATININE 0.93 11/21/2021 0910   CREATININE 0.87 12/30/2013 0924   CALCIUM 9.3 11/21/2021 0910   PROT 7.0 07/25/2019 1512   ALBUMIN 4.2 07/25/2019 1512    AST 16 07/25/2019 1512   ALT 13 07/25/2019 1512   ALKPHOS 52 07/25/2019 1512   BILITOT 0.3 07/25/2019 1512   GFRNONAA 57 (L) 09/10/2020 0153   GFRAA 56 (L) 06/29/2018 2130   Lab Results  Component Value Date   CHOL 168 01/28/2011   HDL 91.90 01/28/2011   LDLCALC 68 01/28/2011   TRIG 41.0 01/28/2011   CHOLHDL 2 01/28/2011   No results found for: "HGBA1C" No results found for: "VITAMINB12" Lab Results  Component Value Date   TSH 1.257 06/29/2018     ASSESSMENT AND PLAN 78 y.o. year old female  has a past medical history of Allergy, Arthritis, Asthma, Atrial fibrillation (HCC), Cataracts, bilateral, Chicken pox, Endometriosis, Esophageal dysfunction, GERD (gastroesophageal reflux disease), Glaucoma, Hematuria, colonic polyp (06-12-2003), Hyperlipemia, Hypertension, Lichen planus, Osteopenia (05/2013), Sleep apnea, SVT (supraventricular tachycardia) (HCC), Thyroid disease, and Tubular adenoma of colon. here with  No diagnosis found.    EMRYN FLANERY continues CPAP therapy but uncertain if it is helping her. She continues to have daytime sleepiness but reports not being bothered by this. She feels that she gets 3-5 hours of restful sleep and then tosses and turns for a few hours every night. She is not interested in taking sleep aids. Advised she could try melatonin OTC. Compliance report reveals excellent compliance. AHI is very well managed. She was encouraged to continue CPAP nightly for at least 4 hours. Risks of untreated sleep apnea review and education materials provided. Healthy lifestyle habits encouraged. She will follow up in 1 year, sooner if needed. She verbalizes understanding and agreement with this plan.    No orders of the defined types were placed in this encounter.    No orders of the defined types were placed in this encounter.     Shawnie Dapper, FNP-C 12/01/2023, 8:20 AM [facility] [address] [place], [place] [address] [phone]

## 2023-12-01 NOTE — Progress Notes (Unsigned)
 78 y.o. G12P2002 Married Caucasian female here for a breast and pelvic exam.   Patient desires yearly breast and pelvic exams.   The patient is also followed for lichenoid inflammation, lichen planus of the vulva.  She sees dermatologist, Dr. Inis Sizer at [facility] 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 She is using Tacrolimus, which is working well orally and on the vulva.  No pain or bleeding.   She has a potential prednisone allergy. Her dermatologist is prescribing Tacrolimus orally and on the vulva for the last 6 months.  The symptoms are improved.   PCP: Creola Corn, MD   No LMP recorded. Patient is postmenopausal.           Sexually active: No.  The current method of family planning is post menopausal status.    Menopausal hormone therapy:  n/a Exercising: Yes.     treadmill Smoker:  former  OB History     Gravida  2   Para  2   Term  2   Preterm      AB      Living  2      SAB      IAB      Ectopic      Multiple      Live Births              HEALTH MAINTENANCE: Last 2 paps: 10/08/22 neg: HR HPV neg, 09/23/18 neg History of abnormal Pap or positive HPV:  no Mammogram:  03/27/23 Breast Density Cat C, BI-RADS CAT 1 neg Colonoscopy:  12/27/19 Bone Density:  2022 per pt  Result  osteopenia.  PCP following.    Immunization History  Administered Date(s) Administered   Fluad Quad(high Dose 65+) 07/02/2019   Influenza Split 07/20/2012   Influenza, High Dose Seasonal PF 07/26/2018   Influenza,inj,Quad PF,6+ Mos 09/16/2017   Influenza-Unspecified 08/04/2018   Moderna Sars-Covid-2 Vaccination 11/01/2019, 11/29/2019   Pneumococcal Polysaccharide-23 11/30/2009   Tdap 02/17/2013   Zoster Recombinant(Shingrix) 03/27/2018, 06/12/2018      reports that she quit smoking about 39 years ago. Her smoking use included cigarettes. She started smoking about 64 years ago. She has a 7.5 pack-year smoking history. She has never used smokeless tobacco. She  reports current alcohol use. She reports that she does not use drugs.  Past Medical History:  Diagnosis Date   Allergy    Arthritis    hands   Asthma    rarely uses inhaler   Atrial fibrillation (HCC)    Cataracts, bilateral    Chicken pox    Endometriosis    Esophageal dysfunction    GERD (gastroesophageal reflux disease)    Glaucoma    surg for preventive for NARROW ANGLE   Hematuria    Hx of colonic polyp 06-12-2003   Colonoscopy Dr Madilyn Fireman   Hyperlipemia    Hypertension    Lichen planus    Osteopenia 05/2013   T score -2.3 FRAX 13%/2.7%   Sleep apnea    Borderline.   Did not require sleeping.     SVT (supraventricular tachycardia) (HCC)    Thyroid disease    nodule on thyroid   Tubular adenoma of colon     Past Surgical History:  Procedure Laterality Date   APPENDECTOMY     BREAST CYST EXCISION  AGE 4, 23, 56   bilateral   BREAST EXCISIONAL BIOPSY Bilateral    CATARACT EXTRACTION     CATARACT EXTRACTION  x2   COLONOSCOPY     cyst removal  12/11/2023   DILATATION & CURETTAGE/HYSTEROSCOPY WITH TRUECLEAR N/A 02/22/2014   Procedure: DILATATION & CURETTAGE/HYSTEROSCOPY WITH TRUCLEAR;  Surgeon: Dara Lords, MD;  Location: [facility];  Service: Gynecology;  Laterality: N/A;   ESOPHAGEAL MANOMETRY N/A 11/29/2012   Procedure: ESOPHAGEAL MANOMETRY (EM);  Surgeon: Mardella Layman, MD;  Location: [facility];  Service: Endoscopy;  Laterality: N/A;   EYE SURGERY  2010   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     albuterol (VENTOLIN HFA) 108 (90 Base) MCG/ACT inhaler Inhale 2 puffs into the lungs every 6 (six) hours as needed for wheezing or shortness of breath.     alendronate (FOSAMAX) 70 MG tablet Take 70 mg by mouth every Wednesday. Every other week     amLODipine (NORVASC) 5 MG tablet TAKE ONE TABLET BY MOUTH DAILY 90 tablet 3   apixaban (ELIQUIS) 5 MG TABS tablet Take 1 tablet (5 mg total) by mouth 2 (two) times daily. 60 tablet 5   Artificial Tear Solution (GENTEAL TEARS) 0.1-0.2-0.3 % SOLN Place 1 drop into both eyes daily as needed (dry eyes).     Calcium Citrate (CITRACAL PO) Take 2 tablets by mouth daily. 500 mg Calcium & 1000 units of Vitamin D     clobetasol (TEMOVATE) 0.05 % GEL Apply topically as needed.     esomeprazole (NEXIUM) 20 MG capsule Take 20 mg by mouth as needed.     metoprolol succinate (TOPROL-XL) 100 MG 24 hr tablet TAKE ONE TABLET BY MOUTH ONCE DAILY 90 tablet 3   Pravastatin Sodium (PRAVACHOL PO) Take by mouth.     tacrolimus (PROTOPIC) 0.1 % ointment Apply topically 2 (two) times a week. Twice weekly     triamcinolone (KENALOG) 0.025 % ointment Apply 1 Application topically 2 (two) times daily. Use as needed. 30 g 0   No current facility-administered medications for this visit.    ALLERGIES: Nitrofurantoin, Cortisone, Lisinopril, and Symbicort [budesonide-formoterol fumarate]  Family History  Problem Relation Age of Onset   Colon cancer Mother 93   Alcohol abuse Mother    Mental illness Mother    Lung cancer Father        smoker   Alcohol abuse Father    Hypertension Father    Colon cancer Father 6   Breast cancer Maternal Aunt        Age 40   Heart attack Paternal Grandmother    Other Cousin        mother side-vocal cord cancer   Breast cancer Cousin 51    Review of Systems  All other systems reviewed  and are negative.   PHYSICAL EXAM:  BP 118/84 (BP Location: Left Arm, Patient Position: Sitting, Cuff Size: Small)   Pulse (!) 59   Ht 5' 4.5" (1.638 m)  Wt 154 lb (69.9 kg)   SpO2 98%   BMI 26.03 kg/m     General appearance: alert, cooperative and appears stated age Head: normocephalic, without obvious abnormality, atraumatic Neck: no adenopathy, supple, symmetrical, trachea midline and thyroid normal to inspection and palpation Lungs: clear to auscultation bilaterally Breasts: normal appearance, no masses or tenderness, No nipple retraction or dimpling, No nipple discharge or bleeding, No axillary adenopathy Heart: regular rate and rhythm Abdomen: soft, non-tender; no masses, no organomegaly Extremities: extremities normal, atraumatic, no cyanosis or edema Skin: skin color, texture, turgor normal. No rashes or lesions Lymph nodes: cervical, supraclavicular, and axillary nodes normal. Neurologic: grossly normal  Pelvic: External genitalia:                No abnormal inguinal nodes palpated.              Urethra:  normal appearing urethra with no masses, tenderness or lesions              Bartholins and Skenes: normal                 Vagina: normal appearing vagina with normal color and discharge, no lesions              Cervix: no lesions              Pap taken: no Bimanual Exam:  Uterus:  normal size, contour, position, consistency, mobility, non-tender              Adnexa: no mass, fullness, tenderness              Rectal exam: yes.  Confirms.              Anus:  normal sphincter tone, no lesions  Chaperone was present for exam:  Jada, CMA  ASSESSMENT: Encounter for breast and pelvic exam.  Lichenoid inflammation of vulva, consistent with lichen planus.  Hx atrial fibrillation.  On Eliquis.  Allergy to steroid.   Has tolerated oral triamcinolone.  Osteopenia.  On Fosamax. ***  PLAN: Mammogram screening discussed. Self breast awareness reviewed. Pap and HRV  collected:  No. Guidelines for Calcium, Vitamin D, regular exercise program including cardiovascular and weight bearing exercise. Medication refills:  NA Follow up:  ***    Additional counseling given.  {yes [id number]. ***  total time was spent for this patient encounter, including preparation, face-to-face counseling with the patient, coordination of care, and documentation of the encounter in addition to doing the breast and pelvic exam.

## 2023-12-02 ENCOUNTER — Ambulatory Visit (INDEPENDENT_AMBULATORY_CARE_PROVIDER_SITE_OTHER): Payer: Medicare Other | Admitting: Family Medicine

## 2023-12-02 ENCOUNTER — Encounter: Payer: Self-pay | Admitting: Family Medicine

## 2023-12-02 VITALS — BP 112/74 | HR 78 | Ht 62.0 in | Wt 155.0 lb

## 2023-12-02 DIAGNOSIS — G4733 Obstructive sleep apnea (adult) (pediatric): Secondary | ICD-10-CM

## 2023-12-02 NOTE — Patient Instructions (Addendum)
Please continue using your CPAP regularly. While your insurance requires that you use CPAP at least 4 hours each night on 70% of the nights, I recommend, that you not skip any nights and use it throughout the night if you can. Getting used to CPAP and staying with the treatment long term does take time and patience and discipline. Untreated obstructive sleep apnea when it is moderate to severe can have an adverse impact on cardiovascular health and raise her risk for heart disease, arrhythmias, hypertension, congestive heart failure, stroke and diabetes. Untreated obstructive sleep apnea causes sleep disruption, nonrestorative sleep, and sleep deprivation. This can have an impact on your day to day functioning and cause daytime sleepiness and impairment of cognitive function, memory loss, mood disturbance, and problems focussing. Using CPAP regularly can improve these symptoms.  We will update supply orders, today. Keep an eye on the air leak at home.   Follow up in 1 year

## 2023-12-03 DIAGNOSIS — L720 Epidermal cyst: Secondary | ICD-10-CM | POA: Diagnosis not present

## 2023-12-11 HISTORY — PX: OTHER SURGICAL HISTORY: SHX169

## 2023-12-15 ENCOUNTER — Encounter: Payer: Self-pay | Admitting: Obstetrics and Gynecology

## 2023-12-15 ENCOUNTER — Ambulatory Visit (INDEPENDENT_AMBULATORY_CARE_PROVIDER_SITE_OTHER): Payer: Medicare Other | Admitting: Obstetrics and Gynecology

## 2023-12-15 VITALS — BP 118/84 | HR 59 | Ht 64.5 in | Wt 154.0 lb

## 2023-12-15 DIAGNOSIS — Z01419 Encounter for gynecological examination (general) (routine) without abnormal findings: Secondary | ICD-10-CM | POA: Diagnosis not present

## 2023-12-15 DIAGNOSIS — N763 Subacute and chronic vulvitis: Secondary | ICD-10-CM | POA: Diagnosis not present

## 2023-12-15 DIAGNOSIS — M8580 Other specified disorders of bone density and structure, unspecified site: Secondary | ICD-10-CM

## 2023-12-15 DIAGNOSIS — Z872 Personal history of diseases of the skin and subcutaneous tissue: Secondary | ICD-10-CM

## 2023-12-15 NOTE — Patient Instructions (Signed)

## 2023-12-17 DIAGNOSIS — L438 Other lichen planus: Secondary | ICD-10-CM | POA: Diagnosis not present

## 2023-12-18 ENCOUNTER — Telehealth: Payer: Self-pay | Admitting: Nurse Practitioner

## 2023-12-18 NOTE — Telephone Encounter (Signed)
 Returned patient call & she stated she was seen by cardiology on 11/16/23 d/t chest pain and elevated HR while using the treadmill. EKG normal per pt & was seen by cardiology on 12/02/23 and was advised it would okay to continue with EGD. OV note states "Paroxysmal Atrial Fibrillation On Eliquis for anticoagulation. No recent episodes reported. -Continue Eliquis as prescribed.  She is pending upcoming GI evaluation.  She was cleared to proceed after holding Eliquis for 2 days.  She is a low risk candidate." Advised patient it would be okay to proceed with procedure as planned.

## 2023-12-18 NOTE — Telephone Encounter (Signed)
 Inbound call from patient stating that she has questions about her upcoming EGD procedure on 3/4. Patient states that she read on her instructions that states if seen by a heart or lung doctor before procedure to call the office. Patient is requesting a call to discuss. Please advise.

## 2023-12-22 ENCOUNTER — Ambulatory Visit (AMBULATORY_SURGERY_CENTER): Payer: Medicare Other | Admitting: Internal Medicine

## 2023-12-22 ENCOUNTER — Encounter: Payer: Self-pay | Admitting: Internal Medicine

## 2023-12-22 VITALS — BP 141/85 | HR 65 | Temp 97.9°F | Resp 15 | Ht 62.0 in | Wt 153.0 lb

## 2023-12-22 DIAGNOSIS — I4891 Unspecified atrial fibrillation: Secondary | ICD-10-CM | POA: Diagnosis not present

## 2023-12-22 DIAGNOSIS — K219 Gastro-esophageal reflux disease without esophagitis: Secondary | ICD-10-CM

## 2023-12-22 DIAGNOSIS — I10 Essential (primary) hypertension: Secondary | ICD-10-CM | POA: Diagnosis not present

## 2023-12-22 DIAGNOSIS — K449 Diaphragmatic hernia without obstruction or gangrene: Secondary | ICD-10-CM

## 2023-12-22 DIAGNOSIS — R1013 Epigastric pain: Secondary | ICD-10-CM

## 2023-12-22 DIAGNOSIS — G4730 Sleep apnea, unspecified: Secondary | ICD-10-CM | POA: Diagnosis not present

## 2023-12-22 MED ORDER — SODIUM CHLORIDE 0.9 % IV SOLN: 500.0000 mL | Freq: Once | INTRAVENOUS | Status: DC

## 2023-12-22 NOTE — Progress Notes (Signed)
 GASTROENTEROLOGY PROCEDURE H&P NOTE   Primary Care Physician: Creola Corn, MD    Reason for Procedure:  Chronic GERD, episodic epigastric pain  Plan:    EGD  Patient is appropriate for endoscopic procedure(s) in the ambulatory ([facility]) setting.  The nature of the procedure, as well as the risks, benefits, and alternatives were carefully and thoroughly reviewed with the patient. Ample time for discussion and questions allowed. The patient understood, was satisfied, and agreed to proceed.     HPI: Meagan Key is a 78 y.o. female who presents for EGD.  Medical history as below.    No recent chest pain or shortness of breath.  No abdominal pain today.  Eliquis on hold x 2 days  Past Medical History:  Diagnosis Date   Allergy    Arthritis    hands   Asthma    rarely uses inhaler   Atrial fibrillation (HCC)    Cataracts, bilateral    Chicken pox    Endometriosis    Esophageal dysfunction    GERD (gastroesophageal reflux disease)    Glaucoma    surg for preventive for NARROW ANGLE   Hematuria    Hx of colonic polyp 06-12-2003   Colonoscopy Dr Madilyn Fireman   Hyperlipemia    Hypertension    Lichen planus    Osteopenia 05/2013   T score -2.3 FRAX 13%/2.7%   Sleep apnea    Borderline.   Did not require sleeping.     SVT (supraventricular tachycardia) (HCC)    Thyroid disease    nodule on thyroid   Tubular adenoma of colon     Past Surgical History:  Procedure Laterality Date   APPENDECTOMY     BREAST CYST EXCISION  AGE 33, 23, 56   bilateral   BREAST EXCISIONAL BIOPSY Bilateral    CATARACT EXTRACTION     CATARACT EXTRACTION     x2   COLONOSCOPY     cyst removal  12/11/2023   DILATATION & CURETTAGE/HYSTEROSCOPY WITH TRUECLEAR N/A 02/22/2014   Procedure: DILATATION & CURETTAGE/HYSTEROSCOPY WITH TRUCLEAR;  Surgeon: Dara Lords, MD;  Location: [facility];  Service: Gynecology;  Laterality: N/A;   ESOPHAGEAL MANOMETRY N/A 11/29/2012   Procedure: ESOPHAGEAL MANOMETRY  (EM);  Surgeon: Mardella Layman, MD;  Location: [facility];  Service: Endoscopy;  Laterality: N/A;   EYE SURGERY  2010   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 Authorizing Provider  amLODipine (NORVASC) 5 MG tablet TAKE ONE TABLET BY MOUTH DAILY 04/20/23  Yes Hochrein, Fayrene Fearing, MD  Calcium Citrate (CITRACAL PO) Take 2 tablets by mouth daily. 500 mg Calcium & 1000 units of Vitamin D   Yes [provider]  clobetasol (TEMOVATE) 0.05 % GEL Apply topically as needed. 06/11/23  Yes [provider]  esomeprazole (NEXIUM) 20 MG capsule Take 20 mg by mouth as needed.   Yes [provider]  metoprolol succinate (TOPROL-XL) 100 MG 24 hr tablet TAKE ONE TABLET  BY MOUTH ONCE DAILY 07/07/23  Yes Rollene Rotunda, MD  Pravastatin Sodium (PRAVACHOL PO) Take by mouth.   Yes [provider]  tacrolimus (PROTOPIC) 0.1 % ointment Apply topically 2 (two) times a week. Twice weekly   Yes [provider]  acetaminophen (TYLENOL) 500 MG tablet Take 500 mg by mouth every 8 (eight) hours as needed for moderate pain (pain score 4-6).    [provider]  albuterol (VENTOLIN HFA) 108 (90 Base) MCG/ACT inhaler Inhale 2 puffs into the lungs every 6 (six) hours as needed for wheezing or shortness of breath.    [provider]  alendronate (FOSAMAX) 70 MG tablet Take 70 mg by mouth every Wednesday. Every other week 10/11/21   [provider]  apixaban (ELIQUIS) 5 MG TABS  tablet Take 1 tablet (5 mg total) by mouth 2 (two) times daily. 08/20/23   Rollene Rotunda, MD  Artificial Tear Solution (GENTEAL TEARS) 0.1-0.2-0.3 % SOLN Place 1 drop into both eyes daily as needed (dry eyes).    [provider]  triamcinolone (KENALOG) 0.025 % ointment Apply 1 Application topically 2 (two) times daily. Use as needed. 10/08/22   Patton Salles, MD    Current Outpatient Medications  Medication Sig Dispense Refill   amLODipine (NORVASC) 5 MG tablet TAKE ONE TABLET BY MOUTH DAILY 90 tablet 3   Calcium Citrate (CITRACAL PO) Take 2 tablets by mouth daily. 500 mg Calcium & 1000 units of Vitamin D     clobetasol (TEMOVATE) 0.05 % GEL Apply topically as needed.     esomeprazole (NEXIUM) 20 MG capsule Take 20 mg by mouth as needed.     metoprolol succinate (TOPROL-XL) 100 MG 24 hr tablet TAKE ONE TABLET BY MOUTH ONCE DAILY 90 tablet 3   Pravastatin Sodium (PRAVACHOL PO) Take by mouth.     tacrolimus (PROTOPIC) 0.1 % ointment Apply topically 2 (two) times a week. Twice weekly     acetaminophen (TYLENOL) 500 MG tablet Take 500 mg by mouth every 8 (eight) hours as needed for moderate pain (pain score 4-6).     albuterol (VENTOLIN HFA) 108 (90 Base) MCG/ACT inhaler Inhale 2 puffs into the lungs every 6 (six) hours as needed for wheezing or shortness of breath.     alendronate (FOSAMAX) 70 MG tablet Take 70 mg by mouth every Wednesday. Every other week     apixaban (ELIQUIS) 5 MG TABS tablet Take 1 tablet (5 mg total) by mouth 2 (two) times daily. 60 tablet 5   Artificial Tear Solution (GENTEAL TEARS) 0.1-0.2-0.3 % SOLN Place 1 drop into both eyes daily as needed (dry eyes).     triamcinolone (KENALOG) 0.025 % ointment Apply 1 Application topically 2 (two) times daily. Use as needed. 30 g 0   Current Facility-Administered Medications  Medication Dose Route Frequency Provider Last Rate Last Admin   0.9 %  sodium chloride infusion  500 mL Intravenous Once Shalise Rosado, Carie Caddy,  MD        Allergies as of 12/22/2023 - Review Complete 12/22/2023  Allergen Reaction Noted   Cortisone Other (See Comments) 09/07/2019   Nitrofurantoin Shortness Of Breath and Other (See Comments)    Symbicort [budesonide-formoterol fumarate] Other (See Comments) 09/07/2019   Lisinopril Itching and Other (See Comments)     Family History  Problem Relation Age of Onset   Colon cancer Mother 41   Alcohol abuse Mother    Mental illness Mother    Lung cancer Father  smoker   Alcohol abuse Father    Hypertension Father    Colon cancer Father 55   Breast cancer Maternal Aunt        Age 14   Heart attack Paternal Grandmother    Other Cousin        mother side-vocal cord cancer   Breast cancer Cousin 61    Social History   Socioeconomic History   Marital status: Married    Spouse name: Not on file   Number of children: 2   Years of education: Not on file   Highest education level: Not on file  Occupational History   Occupation: attorney    Employer: [employer]   Occupation: attorney  Tobacco Use   Smoking status: Former    Current packs/day: 0.00    Average packs/day: 0.3 packs/day for 25.0 years (7.5 ttl pk-yrs)    Types: Cigarettes    Start date: 09/15/1959    Quit date: 09/14/1984    Years since quitting: 39.2   Smokeless tobacco: Never  Vaping Use   Vaping status: Never Used  Substance and Sexual Activity   Alcohol use: Yes    Comment: once a week   Drug use: No   Sexual activity: Not Currently    Birth control/protection: Post-menopausal    Comment: 1st intercourse 78 yo-Fewer than 5 partners, no STD, no abnormal pap, no DES  Other Topics Concern   Not on file  Social History Narrative   MARRIED; 2 CHILDREN   LAW DEGREE; ATTORNEY   Social Drivers of Health   Financial Resource Strain: Not on file  Food Insecurity: Not on file  Transportation Needs: Not on file  Physical Activity: Not on file  Stress: Not on file  Social Connections:  Not on file  Intimate Partner Violence: Not on file    Physical Exam: Vital signs in last 24 hours: @BP  137/83   Pulse 62   Temp 97.9 F (36.6 C)   Resp 18   Ht 5\' 2"  (1.575 m)   Wt 153 lb (69.4 kg)   SpO2 94%   BMI 27.98 kg/m  GEN: NAD EYE: Sclerae anicteric ENT: MMM CV: Non-tachycardic Pulm: CTA b/l GI: Soft, NT/ND NEURO:  Alert & Oriented x 3   Erick Blinks, MD [facility]  12/22/2023 8:44 AM

## 2023-12-22 NOTE — Patient Instructions (Signed)
 Resume Eliquis today Start Nexium 20 mg PO daily Dr.Pyrtle office will call to schedule the ultrasound  YOU HAD AN ENDOSCOPIC PROCEDURE TODAY: Refer to the procedure report and other information in the discharge instructions given to you for any specific questions about what was found during the examination. If this information does not answer your questions, please call [place] office at [phone] to clarify.   YOU SHOULD EXPECT: Some feelings of bloating in the abdomen. Passage of more gas than usual. Walking can help get rid of the air that was put into your GI tract during the procedure and reduce the bloating. If you had a lower endoscopy (such as a colonoscopy or flexible sigmoidoscopy) you may notice spotting of blood in your stool or on the toilet paper. Some abdominal soreness may be present for a day or two, also.  DIET: Your first meal following the procedure should be a light meal and then it is ok to progress to your normal diet. A half-sandwich or bowl of soup is an example of a good first meal. Heavy or fried foods are harder to digest and may make you feel nauseous or bloated. Drink plenty of fluids but you should avoid alcoholic beverages for 24 hours. If you had a esophageal dilation, please see attached instructions for diet.    ACTIVITY: Your care partner should take you home directly after the procedure. You should plan to take it easy, moving slowly for the rest of the day. You can resume normal activity the day after the procedure however YOU SHOULD NOT DRIVE, use power tools, machinery or perform tasks that involve climbing or major physical exertion for 24 hours (because of the sedation medicines used during the test).   SYMPTOMS TO REPORT IMMEDIATELY: A gastroenterologist can be reached at any hour. Please call [phone]  for any of the following symptoms:  Following upper endoscopy  Vomiting of blood or coffee ground material  New, significant abdominal pain  New,  significant chest pain or pain under the shoulder blades  Painful or persistently difficult swallowing  New shortness of breath  Black, tarry-looking or red, bloody stools  FOLLOW UP:  If any biopsies were taken you will be contacted by phone or by letter within the next 1-3 weeks. Call [phone]  if you have not heard about the biopsies in 3 weeks.  Please also call with any specific questions about appointments or follow up tests.

## 2023-12-22 NOTE — Op Note (Signed)
 [facility] Patient Name: Meagan Key Procedure Date: 12/22/2023 8:44 AM MRN: [id number] Endoscopist: Beverley Fiedler , MD, [id number] Age: 78 Referring MD:  Date of Birth: 1946-07-05 Gender: Female Account #: [id number] Procedure:                Upper GI endoscopy Indications:              Epigastric abdominal pain (episodic and isolated),                            Gastro-esophageal reflux disease (chronic) with                            long-term esomeprazole 40 mg, discontinued briefly                            and then resumed at 20 mg daily Medicines:                Monitored Anesthesia Care Procedure:                Pre-Anesthesia Assessment:                           - Prior to the procedure, a History and Physical                            was performed, and patient medications and                            allergies were reviewed. The patient's tolerance of                            previous anesthesia was also reviewed. The risks                            and benefits of the procedure and the sedation                            options and risks were discussed with the patient.                            All questions were answered, and informed consent                            was obtained. Prior Anticoagulants: The patient has                            taken Eliquis (apixaban), last dose was 2 days                            prior to procedure. ASA Grade Assessment: II - A                            patient with mild systemic disease. After reviewing  the risks and benefits, the patient was deemed in                            satisfactory condition to undergo the procedure.                           After obtaining informed consent, the endoscope was                            passed under direct vision. Throughout the                            procedure, the patient's blood pressure, pulse, and                            oxygen  saturations were monitored continuously. The                            Olympus Scope [id number] was introduced through the                            mouth, and advanced to the second part of duodenum.                            The upper GI endoscopy was accomplished without                            difficulty. The patient tolerated the procedure                            well. Scope In: Scope Out: Findings:                 Normal mucosa was found in the entire esophagus.                           A medium-sized hiatal hernia was found. The                            proximal extent of the gastric folds (end of                            tubular esophagus) was 35 cm from the incisors. The                            hiatal narrowing was 40 cm from the incisors.                           The gastroesophageal flap valve was visualized                            endoscopically and classified as Hill Grade III                            (  minimal fold, loose to endoscope, hiatal hernia                            likely).                           The entire examined stomach was normal.                           The examined duodenum was normal. Complications:            No immediate complications. Estimated Blood Loss:     Estimated blood loss: none. Impression:               - Normal mucosa was found in the entire esophagus.                           - Medium-sized hiatal hernia.                           - Normal stomach.                           - Normal examined duodenum.                           - No specimens collected. Recommendation:           - Patient has a contact number available for                            emergencies. The signs and symptoms of potential                            delayed complications were discussed with the                            patient. Return to normal activities tomorrow.                            Written discharge instructions were provided  to the                            patient.                           - Resume previous diet.                           - Continue present medications. Given hiatal hernia                            Nexium 20 mg daily is recommended.                           - RUQ [place] to evaluate for gallstones. Beverley Fiedler, MD 12/22/2023 9:13:31 AM This report has been signed electronically.

## 2023-12-22 NOTE — Progress Notes (Signed)
 Report to PACU, RN, vss, BBS= Clear.

## 2023-12-22 NOTE — Progress Notes (Signed)
 Pt's states no medical or surgical changes since previsit or office visit.

## 2023-12-23 ENCOUNTER — Other Ambulatory Visit: Payer: Self-pay

## 2023-12-23 ENCOUNTER — Telehealth: Payer: Self-pay | Admitting: *Deleted

## 2023-12-23 ENCOUNTER — Telehealth: Payer: Self-pay

## 2023-12-23 DIAGNOSIS — R1013 Epigastric pain: Secondary | ICD-10-CM

## 2023-12-23 NOTE — Telephone Encounter (Signed)
  Follow up Call-     12/22/2023    8:19 AM  Call back number  Post procedure Call Back phone  # [phone]  Permission to leave phone message Yes     Patient questions:  Do you have a fever, pain , or abdominal swelling? No. Pain Score  0 *  Have you tolerated food without any problems? Yes.    Have you been able to return to your normal activities? Yes.    Do you have any questions about your discharge instructions: Diet   No. Medications  No. Follow up visit  No.  Do you have questions or concerns about your Care? No.  Actions: * If pain score is 4 or above: No action needed, pain <4.

## 2023-12-23 NOTE — Telephone Encounter (Signed)
 Dr. Rhea Belton wants Meagan Key to have a RUQ ultrasound to be evaluated for gallstones. Meagan Key scheduled at [facility] hospital for 12/28/23 at 9:30 am, arriving at 9:00 am. Informed Meagan Key of appt and nothing to eat or drink 6 hours prior to test. Meagan Key verbalized understanding.

## 2023-12-28 ENCOUNTER — Ambulatory Visit (HOSPITAL_COMMUNITY)
Admission: RE | Admit: 2023-12-28 | Discharge: 2023-12-28 | Disposition: A | Discharge status: Home / Self Care | Source: Ambulatory Visit | Attending: Internal Medicine | Admitting: Internal Medicine

## 2023-12-28 DIAGNOSIS — R1013 Epigastric pain: Secondary | ICD-10-CM | POA: Diagnosis not present

## 2023-12-28 DIAGNOSIS — Z0389 Encounter for observation for other suspected diseases and conditions ruled out: Secondary | ICD-10-CM | POA: Diagnosis not present

## 2023-12-28 DIAGNOSIS — D1803 Hemangioma of intra-abdominal structures: Secondary | ICD-10-CM | POA: Diagnosis not present

## 2023-12-28 DIAGNOSIS — R160 Hepatomegaly, not elsewhere classified: Secondary | ICD-10-CM | POA: Diagnosis not present

## 2024-01-11 ENCOUNTER — Encounter: Payer: Self-pay | Admitting: Internal Medicine

## 2024-02-14 DIAGNOSIS — R072 Precordial pain: Secondary | ICD-10-CM | POA: Insufficient documentation

## 2024-02-14 NOTE — Progress Notes (Unsigned)
  Cardiology Office Note:   Date:  02/15/2024  ID:  Meagan Key, DOB 09/15/1946, MRN [id number] PCP: Margarete Sharps, MD  [facility] Providers Cardiologist:  Eilleen Grates, MD {  History of Present Illness:   Meagan Key is a 78 y.o. female with past medical history of PAF on Eliquis , OSA on CPAP, hyperlipidemia and hypertension.  ETT in 2018 was normal.  Echocardiogram in October 2019 showed EF 60 to 65%, grade 2 DD, mild MR.  She previously had A-fib with RVR and was seen in the ED.  She was able to spontaneously convert back to sinus rhythm.  Previous treadmill stress test in January 2023 was abnormal study with 2 mm downsloping ST segment depression in multiple leads.  Due to exertional symptom, she ultimately underwent a cardiac catheterization on 11/26/2021 which showed EF 55 to 65%, normal coronary arteries.  Patient was last seen by Dr. Lavonne Prairie in May 2024 at which time she was doing well.  She was recently cleared for GI procedure endoscopy after holding Eliquis  for 2 days.   She had chest pain in Jan 2025.  This was thought to be nonanginal and she eventually was found to have small hiatal hernia and is being treated with Nexium .  She does notice that this happened after some fatty food.  She is otherwise able to be active.  She denies any cardiovascular symptoms. The patient denies any new symptoms such as chest discomfort, neck or arm discomfort. There has been no new shortness of breath, PND or orthopnea. There have been no reported palpitations, presyncope or syncope.   ROS: As stated in the HPI and negative for all other systems.  Studies Reviewed:    EKG:     Risk Assessment/Calculations:    CHA2DS2-VASc Score = 5   This indicates a 7.2% annual risk of stroke. The patient's score is based upon: CHF History: 0 HTN History: 1 Diabetes History: 0 Stroke History: 0 Vascular Disease History: 1 Age Score: 2 Gender Score: 1  Physical Exam:   VS:  BP 118/60    Pulse (!) 57   Ht 5\' 2"  (1.575 m)   Wt 154 lb 6.4 oz (70 kg)   SpO2 99%   BMI 28.24 kg/m    Wt Readings from Last 3 Encounters:  02/15/24 154 lb 6.4 oz (70 kg)  12/22/23 153 lb (69.4 kg)  12/15/23 154 lb (69.9 kg)     GEN: Well nourished, well developed in no acute distress NECK: No JVD; No carotid bruits CARDIAC: RRR, no murmurs, rubs, gallops RESPIRATORY:  Clear to auscultation without rales, wheezing or rhonchi  ABDOMEN: Soft, non-tender, non-distended EXTREMITIES:  No edema; No deformity   ASSESSMENT AND PLAN:   Chest pain: The patient has nonanginal chest discomfort and seems to be controlled.  She has had normal coronaries.  No further workup.  Paroxysmal Atrial Fibrillation: She has not had any symptomatic paroxysms.  She tolerates anticoagulation.  She is going to get blood work this summer.  No further workup.   Hypertension: Her blood pressure is at target.  No change in therapy.      Follow up with me in 1 year or sooner if needed  Signed, Eilleen Grates, MD

## 2024-02-15 ENCOUNTER — Encounter: Payer: Self-pay | Admitting: Cardiology

## 2024-02-15 ENCOUNTER — Ambulatory Visit: Payer: Medicare Other | Attending: Cardiology | Admitting: Cardiology

## 2024-02-15 VITALS — BP 118/60 | HR 57 | Ht 62.0 in | Wt 154.4 lb

## 2024-02-15 DIAGNOSIS — I480 Paroxysmal atrial fibrillation: Secondary | ICD-10-CM | POA: Insufficient documentation

## 2024-02-15 DIAGNOSIS — I10 Essential (primary) hypertension: Secondary | ICD-10-CM | POA: Insufficient documentation

## 2024-02-15 DIAGNOSIS — R072 Precordial pain: Secondary | ICD-10-CM | POA: Diagnosis not present

## 2024-02-15 NOTE — Patient Instructions (Signed)
 Medication Instructions:  Your physician recommends that you continue on your current medications as directed. Please refer to the Current Medication list given to you today.  *If you need a refill on your cardiac medications before your next appointment, please call your pharmacy*  Follow-Up: At [facility], you and your health needs are our priority.  As part of our continuing mission to provide you with exceptional heart care, our providers are all part of one team.  This team includes your primary Cardiologist (physician) and Advanced Practice Providers or APPs (Physician Assistants and Nurse Practitioners) who all work together to provide you with the care you need, when you need it.  Your next appointment:   1 year(s)  Provider:   Eilleen Grates, MD

## 2024-02-16 ENCOUNTER — Other Ambulatory Visit: Payer: Self-pay | Admitting: Cardiology

## 2024-02-16 DIAGNOSIS — I482 Chronic atrial fibrillation, unspecified: Secondary | ICD-10-CM

## 2024-02-16 NOTE — Telephone Encounter (Signed)
 Prescription refill request for Eliquis  received. Indication:afib Last office visit:4/25 Scr:0.89  10/24 Age: 78 Weight:70  kg  Prescription refilled

## 2024-02-29 ENCOUNTER — Other Ambulatory Visit: Payer: Self-pay | Admitting: Internal Medicine

## 2024-02-29 DIAGNOSIS — Z1231 Encounter for screening mammogram for malignant neoplasm of breast: Secondary | ICD-10-CM

## 2024-03-28 ENCOUNTER — Ambulatory Visit
Admission: RE | Admit: 2024-03-28 | Discharge: 2024-03-28 | Disposition: A | Discharge status: Home / Self Care | Source: Ambulatory Visit | Attending: Internal Medicine | Admitting: Internal Medicine

## 2024-03-28 DIAGNOSIS — Z1231 Encounter for screening mammogram for malignant neoplasm of breast: Secondary | ICD-10-CM | POA: Diagnosis not present

## 2024-03-31 DIAGNOSIS — M8589 Other specified disorders of bone density and structure, multiple sites: Secondary | ICD-10-CM | POA: Diagnosis not present

## 2024-04-19 ENCOUNTER — Other Ambulatory Visit: Payer: Self-pay | Admitting: Cardiology

## 2024-04-19 DIAGNOSIS — L720 Epidermal cyst: Secondary | ICD-10-CM | POA: Diagnosis not present

## 2024-04-19 DIAGNOSIS — D1801 Hemangioma of skin and subcutaneous tissue: Secondary | ICD-10-CM | POA: Diagnosis not present

## 2024-05-04 DIAGNOSIS — I482 Chronic atrial fibrillation, unspecified: Secondary | ICD-10-CM | POA: Diagnosis not present

## 2024-05-05 ENCOUNTER — Ambulatory Visit: Payer: Self-pay | Admitting: Cardiology

## 2024-05-05 LAB — CBC WITH DIFFERENTIAL/PLATELET
Basophils Absolute: 0.1 x10E3/uL (ref 0.0–0.2)
Basos: 1 %
EOS (ABSOLUTE): 0.1 x10E3/uL (ref 0.0–0.4)
Eos: 2 %
Hematocrit: 40.2 % (ref 34.0–46.6)
Hemoglobin: 12.7 g/dL (ref 11.1–15.9)
Immature Grans (Abs): 0 x10E3/uL (ref 0.0–0.1)
Immature Granulocytes: 0 %
Lymphocytes Absolute: 1.8 x10E3/uL (ref 0.7–3.1)
Lymphs: 30 %
MCH: 28.6 pg (ref 26.6–33.0)
MCHC: 31.6 g/dL (ref 31.5–35.7)
MCV: 91 fL (ref 79–97)
Monocytes Absolute: 0.4 x10E3/uL (ref 0.1–0.9)
Monocytes: 6 %
Neutrophils Absolute: 3.6 x10E3/uL (ref 1.4–7.0)
Neutrophils: 61 %
Platelets: 197 x10E3/uL (ref 150–450)
RBC: 4.44 x10E6/uL (ref 3.77–5.28)
RDW: 13.5 % (ref 11.7–15.4)
WBC: 5.9 x10E3/uL (ref 3.4–10.8)

## 2024-05-05 LAB — BASIC METABOLIC PANEL WITH GFR
BUN/Creatinine Ratio: 24 (ref 12–28)
BUN: 22 mg/dL (ref 8–27)
CO2: 23 mmol/L (ref 20–29)
Calcium: 9.5 mg/dL (ref 8.7–10.3)
Chloride: 104 mmol/L (ref 96–106)
Creatinine, Ser: 0.91 mg/dL (ref 0.57–1.00)
Glucose: 114 mg/dL — ABNORMAL HIGH (ref 70–99)
Potassium: 4.4 mmol/L (ref 3.5–5.2)
Sodium: 141 mmol/L (ref 134–144)
eGFR: 64 mL/min/1.73 (ref >59)

## 2024-05-09 NOTE — Telephone Encounter (Signed)
 Patient is returning call.

## 2024-07-11 ENCOUNTER — Other Ambulatory Visit (HOSPITAL_COMMUNITY): Payer: Self-pay

## 2024-07-11 MED ORDER — CLOBETASOL PROPIONATE 0.05 % EX GEL
CUTANEOUS | 2 refills | Status: AC
  Filled 2024-07-11: qty 30, 30d supply, fill #0

## 2024-07-12 ENCOUNTER — Other Ambulatory Visit (HOSPITAL_COMMUNITY): Payer: Self-pay

## 2024-07-17 ENCOUNTER — Other Ambulatory Visit: Payer: Self-pay | Admitting: Cardiology

## 2024-07-19 DIAGNOSIS — H40023 Open angle with borderline findings, high risk, bilateral: Secondary | ICD-10-CM | POA: Diagnosis not present

## 2024-07-19 DIAGNOSIS — H268 Other specified cataract: Secondary | ICD-10-CM | POA: Diagnosis not present

## 2024-07-20 ENCOUNTER — Telehealth: Payer: Self-pay | Admitting: Cardiology

## 2024-07-20 ENCOUNTER — Other Ambulatory Visit (HOSPITAL_COMMUNITY): Payer: Self-pay

## 2024-07-20 NOTE — Telephone Encounter (Signed)
*  STAT* If patient is at the pharmacy, call can be transferred to refill team.   1. Which medications need to be refilled? (please list name of each medication and dose if known)  Pravastatin  Sodium (PRAVACHOL  PO)    2. Which pharmacy/location (including street and city if local pharmacy) is medication to be sent to?  [facility] [place], [place] - [address]       3. Do they need a 30 day or 90 day supply? 90 day    Pt is out of medication

## 2024-07-20 NOTE — Telephone Encounter (Signed)
 Pt of Dr. Lavona. Does Dr. Lavona want to refill this? This was last refilled in 2023 by another Provider.

## 2024-07-21 MED ORDER — PRAVASTATIN SODIUM 40 MG PO TABS: 40.0000 mg | ORAL_TABLET | Freq: Every evening | ORAL | 3 refills | Status: AC

## 2024-07-21 NOTE — Telephone Encounter (Signed)
 Spoke with pt regarding her medication. Pt stated she is taking 40 mg once daily prescribed by Dr. Lavona. Refill sent to pt's pharmacy of choice. Pt verbalized understanding. All questions if any were answered.

## 2024-07-21 NOTE — Telephone Encounter (Signed)
 This med for Dr. Denver pt is listed as histoorical and will not allow me to refill. Please advise.

## 2024-07-27 DIAGNOSIS — Z23 Encounter for immunization: Secondary | ICD-10-CM | POA: Diagnosis not present

## 2024-08-04 DIAGNOSIS — Z23 Encounter for immunization: Secondary | ICD-10-CM | POA: Diagnosis not present

## 2024-08-06 ENCOUNTER — Other Ambulatory Visit (HOSPITAL_COMMUNITY): Payer: Self-pay

## 2024-08-06 ENCOUNTER — Other Ambulatory Visit: Payer: Self-pay | Admitting: Cardiology

## 2024-08-06 DIAGNOSIS — I482 Chronic atrial fibrillation, unspecified: Secondary | ICD-10-CM

## 2024-08-08 ENCOUNTER — Other Ambulatory Visit (HOSPITAL_COMMUNITY): Payer: Self-pay

## 2024-08-08 MED ORDER — APIXABAN 5 MG PO TABS
5.0000 mg | ORAL_TABLET | Freq: Two times a day (BID) | ORAL | 5 refills | Status: AC
  Filled 2024-08-08 – 2024-08-22 (×2): qty 60, 30d supply, fill #0
  Filled 2024-09-17: qty 60, 30d supply, fill #1
  Filled 2024-10-17: qty 60, 30d supply, fill #2
  Filled 2024-11-20: qty 60, 30d supply, fill #3

## 2024-08-08 NOTE — Telephone Encounter (Signed)
 Prescription refill request for Eliquis  received. Indication:afib Last office visit:4/25 Scr:0.91  7/25 Age: 78 Weight:70  kg  Prescription refilled

## 2024-08-12 ENCOUNTER — Other Ambulatory Visit (HOSPITAL_COMMUNITY): Payer: Self-pay

## 2024-08-22 ENCOUNTER — Other Ambulatory Visit (HOSPITAL_COMMUNITY): Payer: Self-pay

## 2024-09-17 ENCOUNTER — Other Ambulatory Visit (HOSPITAL_COMMUNITY): Payer: Self-pay

## 2024-11-09 ENCOUNTER — Other Ambulatory Visit (HOSPITAL_COMMUNITY): Payer: Self-pay

## 2024-11-15 ENCOUNTER — Ambulatory Visit: Admitting: Internal Medicine

## 2024-11-16 ENCOUNTER — Telehealth: Payer: Self-pay

## 2024-11-16 ENCOUNTER — Encounter: Payer: Self-pay | Admitting: Internal Medicine

## 2024-11-16 ENCOUNTER — Ambulatory Visit: Admitting: Internal Medicine

## 2024-11-16 ENCOUNTER — Ambulatory Visit (INDEPENDENT_AMBULATORY_CARE_PROVIDER_SITE_OTHER): Admitting: Internal Medicine

## 2024-11-16 VITALS — BP 126/76 | HR 59 | Ht 62.0 in | Wt 153.0 lb

## 2024-11-16 DIAGNOSIS — Z7901 Long term (current) use of anticoagulants: Secondary | ICD-10-CM

## 2024-11-16 DIAGNOSIS — K449 Diaphragmatic hernia without obstruction or gangrene: Secondary | ICD-10-CM

## 2024-11-16 DIAGNOSIS — K219 Gastro-esophageal reflux disease without esophagitis: Secondary | ICD-10-CM | POA: Diagnosis not present

## 2024-11-16 DIAGNOSIS — K5520 Angiodysplasia of colon without hemorrhage: Secondary | ICD-10-CM

## 2024-11-16 DIAGNOSIS — Z860101 Personal history of adenomatous and serrated colon polyps: Secondary | ICD-10-CM | POA: Diagnosis not present

## 2024-11-16 DIAGNOSIS — Z800 Family history of malignant neoplasm of digestive organs: Secondary | ICD-10-CM

## 2024-11-16 MED ORDER — NA SULFATE-K SULFATE-MG SULF 17.5-3.13-1.6 GM/177ML PO SOLN: 1.0000 | Freq: Once | ORAL | 0 refills | Status: AC

## 2024-11-16 MED ORDER — NEXIUM 20 MG PO CPDR: 20.0000 mg | DELAYED_RELEASE_CAPSULE | Freq: Every day | ORAL | 11 refills | Status: AC

## 2024-11-16 NOTE — Progress Notes (Signed)
 "  Subjective:    Patient ID: Meagan Key, female    DOB: 06/01/1946, 79 y.o.   MRN: [id number]  HPI Meagan Key is an 79 year old female with gastroesophageal reflux disease, hiatal hernia, and personal history of adenomatous colonic polyps who presents for follow-up of reflux and colon polyp surveillance.  Longstanding gastroesophageal reflux and epigastric pain have been well controlled with Nexium  20 mg daily before breakfast. She describes two recent episodes of prodromal pain that resolved with positional changes. Previous trials of omeprazole  and generic esomeprazole  were less effective, and over-the-counter options did not provide adequate relief. She requests a prescription for Nexium .  An upper endoscopy on 12/22/2023 for evaluation of epigastric pain and reflux while on Nexium  showed a normal esophagus, a 5 cm hiatal hernia, and normal stomach and duodenum.  Personal history includes multiple adenomatous colonic polyps and a family history of colon cancer. Last colonoscopy on 12/27/2019 was normal except for two small cecal angioectasias. The prior colonoscopy on 11/10/2016 revealed four tubular adenomas, all less than 1 cm, which were removed.  She denies recent anemia, gastrointestinal bleeding, or changes in bowel habits. She is followed for blood counts and had a CBC in July 2025 with hemoglobin 12.7, MCV 91, platelet count 197, and white count 5.3. She is currently taking Eliquis .   Review of Systems As per HPI, otherwise negative  Current Medications, Allergies, Past Medical History, Past Surgical History, Family History and Social History were reviewed in [employer] record.    Objective:   Physical Exam BP 126/76   Pulse (!) 59   Ht 5' 2 (1.575 m)   Wt 153 lb (69.4 kg)   BMI 27.98 kg/m  Gen: awake, alert, NAD HEENT: anicteric  Neuro: nonfocal  Labs CBC (05/04/2024): Hemoglobin 12.7, MCV 91, platelet count 197, white count  5.3  Diagnostic Upper endoscopy (12/22/2023): Normal esophagus, medium sized hiatal hernia (5 cm), normal stomach and duodenum Colonoscopy (12/27/2019): Two small cecal angioectasias, otherwise normal Colonoscopy (11/10/2016): Four polyps removed, all less than 1 cm, tubular adenomas      Assessment & Plan:   Gastroesophageal reflux disease with hiatal hernia Symptoms controlled on esomeprazole ; omeprazole  ineffective. - Prescribed esomeprazole  (Nexium ) 20 mg daily with 'no substitution'. - Documented omeprazole  and generic esomeprazole  inefficacy for insurance. - Advised second dose of Nexium  for breakthrough symptoms. - Discussed over-the-counter purchase if Medicare does not cover.  Personal history of adenomatous colonic polyps/family history of colon cancer (father) Increased risk for polyps and colorectal cancer; surveillance colonoscopy due March 2026. - Scheduled surveillance colonoscopy for March 2026. - Arranged bowel preparation as previous. - Coordinated with Dr. Lavona for two-day Eliquis  hold. - Avoid scheduling on March 2nd due to previously scheduled doctor appointment.  Cecal angioectasia Asymptomatic with normal hemoglobin; continues anticoagulation. - Review recent labs to confirm absence of anemia or bleeding. - No intervention planned due to normal blood counts. - Consider endoscopic ablation if bleeding or anemia develops.  Chronic anticoagulation - Will hold Eliquis  2 days prior to endoscopic procedures - will instruct when and how to resume after procedure. Benefits and risks of procedure explained including risks of bleeding, perforation, infection, missed lesions, reactions to medications and possible need for hospitalization and surgery for complications. Additional rare but real risk of stroke or other vascular clotting events off Eliquis  also explained and need to seek urgent help if any signs of these problems occur. Will communicate by phone or EMR  with patient's  prescribing provider to confirm that holding Eliquis  is reasonable in this case.   30 minutes total spent today including patient facing time, coordination of care, reviewing medical history/procedures/pertinent radiology studies, and documentation of the encounter.  "

## 2024-11-16 NOTE — Telephone Encounter (Signed)
 Pharmacy, can you please provide recommendations for holding Eliquis  for upcoming procedure?  Thank you!

## 2024-11-16 NOTE — Telephone Encounter (Signed)
 [facility] Pre-operative Risk Assessment     Request for surgical clearance:     Endoscopy Procedure  What type of surgery is being performed?     Colonoscopy  When is this surgery scheduled?     01/09/25  What type of clearance is required ?   Pharmacy  Are there any medications that need to be held prior to surgery and how long? Eliquis  x 2 days  Practice name and name of physician performing surgery?      [facility]  What is your office phone and fax number?      Phone- [phone]  Fax- [phone]  Anesthesia type (None, local, MAC, general) ?       MAC   Please route your response to Alan Fusi, CMA

## 2024-11-16 NOTE — Patient Instructions (Signed)
 We have sent the following medications to your pharmacy for you to pick up at your convenience: Nexium  and Suprep.  You have been scheduled for a colonoscopy. Please follow written instructions given to you at your visit today.   If you use inhalers (even only as needed), please bring them with you on the day of your procedure.  DO NOT TAKE 7 DAYS PRIOR TO TEST- Trulicity (dulaglutide) Ozempic, Wegovy (semaglutide) Mounjaro, Zepbound (tirzepatide) Bydureon Bcise (exanatide extended release)  DO NOT TAKE 1 DAY PRIOR TO YOUR TEST Rybelsus (semaglutide) Adlyxin (lixisenatide) Victoza (liraglutide) Byetta (exanatide) ______________________________________________________________  _______________________________________________________  If your blood pressure at your visit was 140/90 or greater, please contact your primary care physician to follow up on this.  _______________________________________________________  If you are age 81 or older, your body mass index should be between 23-30. Your Body mass index is 27.98 kg/m. If this is out of the aforementioned range listed, please consider follow up with your Primary Care Provider.  If you are age 81 or younger, your body mass index should be between 19-25. Your Body mass index is 27.98 kg/m. If this is out of the aformentioned range listed, please consider follow up with your Primary Care Provider.   ________________________________________________________  The [facility] providers would like to encourage you to use MYCHART to communicate with providers for non-urgent requests or questions.  Due to long hold times on the telephone, sending your provider a message by [facility] may be a faster and more efficient way to get a response.  Please allow 48 business hours for a response.  Please remember that this is for non-urgent requests.  _______________________________________________________  [facility] is using a team-based  approach to care.  Your team is made up of your doctor and two to three APPS. Our APPS (Nurse Practitioners and Physician Assistants) work with your physician to ensure care continuity for you. They are fully qualified to address your health concerns and develop a treatment plan. They communicate directly with your gastroenterologist to care for you. Seeing the Advanced Practice Practitioners on your physician's team can help you by facilitating care more promptly, often allowing for earlier appointments, access to diagnostic testing, procedures, and other specialty referrals.

## 2024-11-18 ENCOUNTER — Telehealth: Payer: Self-pay | Admitting: *Deleted

## 2024-11-18 NOTE — Telephone Encounter (Signed)
 Spoke with pt and scheduled her for a preop telehealth appt. Will route back to requesting surgeons office to make them aware.

## 2024-11-18 NOTE — Telephone Encounter (Signed)
"  °  Patient Consent for Virtual Visit        Meagan Key has provided verbal consent on 11/18/2024 for a virtual visit (video or telephone).   CONSENT FOR VIRTUAL VISIT FOR:  Meagan Key  By participating in this virtual visit I agree to the following:  I hereby voluntarily request, consent and authorize [facility] and its employed or contracted physicians, physician assistants, nurse practitioners or other licensed health care professionals (the Practitioner), to provide me with telemedicine health care services (the Services) as deemed necessary by the treating Practitioner. I acknowledge and consent to receive the Services by the Practitioner via telemedicine. I understand that the telemedicine visit will involve communicating with the Practitioner through live audiovisual communication technology and the disclosure of certain medical information by electronic transmission. I acknowledge that I have been given the opportunity to request an in-person assessment or other available alternative prior to the telemedicine visit and am voluntarily participating in the telemedicine visit.  I understand that I have the right to withhold or withdraw my consent to the use of telemedicine in the course of my care at any time, without affecting my right to future care or treatment, and that the Practitioner or I may terminate the telemedicine visit at any time. I understand that I have the right to inspect all information obtained and/or recorded in the course of the telemedicine visit and may receive copies of available information for a reasonable fee.  I understand that some of the potential risks of receiving the Services via telemedicine include:  Delay or interruption in medical evaluation due to technological equipment failure or disruption; Information transmitted may not be sufficient (e.g. poor resolution of images) to allow for appropriate medical decision making by the  Practitioner; and/or  In rare instances, security protocols could fail, causing a breach of personal health information.  Furthermore, I acknowledge that it is my responsibility to provide information about my medical history, conditions and care that is complete and accurate to the best of my ability. I acknowledge that Practitioner's advice, recommendations, and/or decision may be based on factors not within their control, such as incomplete or inaccurate data provided by me or distortions of diagnostic images or specimens that may result from electronic transmissions. I understand that the practice of medicine is not an exact science and that Practitioner makes no warranties or guarantees regarding treatment outcomes. I acknowledge that a copy of this consent can be made available to me via my patient portal [facility] MyChart), or I can request a printed copy by calling the office of [facility].    I understand that my insurance will be billed for this visit.   I have read or had this consent read to me. I understand the contents of this consent, which adequately explains the benefits and risks of the Services being provided via telemedicine.  I have been provided ample opportunity to ask questions regarding this consent and the Services and have had my questions answered to my satisfaction. I give my informed consent for the services to be provided through the use of telemedicine in my medical care    "

## 2024-11-18 NOTE — Telephone Encounter (Signed)
" ° °  Name: Meagan Key  DOB: 01-23-1946  MRN: [id number]  Primary Cardiologist: Lynwood Schilling, MD   Preoperative team, please contact this patient and set up a phone call appointment for further preoperative risk assessment. Please obtain consent and complete medication review. Thank you for your help.  I confirm that guidance regarding antiplatelet and oral anticoagulation therapy has been completed and, if necessary, noted below.  Per office protocol, patient can hold Eliquis  for 2 days prior to procedure.   Patient will not need bridging with Lovenox (enoxaparin) around procedure.   I also confirmed the patient resides in the state of [place] . As per [facility] telemedicine laws, the patient must reside in the state in which the provider is licensed.   Josefa CHRISTELLA Beauvais, NP 11/18/2024, 4:00 PM [facility]    "

## 2024-11-18 NOTE — Telephone Encounter (Signed)
 Patient with diagnosis of Afib on Eliquis  for anticoagulation.    Procedure: Colonoscopy  Date of procedure: 01/09/2025   CHA2DS2-VASc Score = 5   This indicates a 7.2% annual risk of stroke. The patient's score is based upon: CHF History: 0 HTN History: 1 Diabetes History: 0 Stroke History: 0 Vascular Disease History: 1 Age Score: 2 Gender Score: 1     CrCl 45 mL/min Platelet count 197 K   Patient has not had an Afib/aflutter ablation in the last 3 months, DCCV within the last 4 weeks or a watchman implanted in the last 45 days    Per office protocol, patient can hold Eliquis  for 2 days prior to procedure.   Patient will not need bridging with Lovenox (enoxaparin) around procedure.  **This guidance is not considered finalized until pre-operative APP has relayed final recommendations.**

## 2024-11-21 ENCOUNTER — Other Ambulatory Visit (HOSPITAL_COMMUNITY): Payer: Self-pay

## 2024-12-07 ENCOUNTER — Ambulatory Visit: Admitting: Neurology

## 2024-12-07 ENCOUNTER — Ambulatory Visit: Payer: Medicare Other | Admitting: Family Medicine

## 2024-12-08 ENCOUNTER — Ambulatory Visit

## 2024-12-19 ENCOUNTER — Encounter: Payer: Medicare Other | Admitting: Obstetrics and Gynecology

## 2025-01-09 ENCOUNTER — Encounter: Admitting: Internal Medicine

## 2025-05-17 ENCOUNTER — Encounter: Admitting: Obstetrics and Gynecology
